# Patient Record
Sex: Male | Born: 1950
Health system: Southern US, Community
[De-identification: ages and names within clinical notes are randomized; demographics above are authoritative.]

## PROBLEM LIST (undated history)

## (undated) DIAGNOSIS — R269 Unspecified abnormalities of gait and mobility: Secondary | ICD-10-CM

## (undated) DIAGNOSIS — M25569 Pain in unspecified knee: Secondary | ICD-10-CM

## (undated) DIAGNOSIS — M25519 Pain in unspecified shoulder: Secondary | ICD-10-CM

## (undated) DIAGNOSIS — G5600 Carpal tunnel syndrome, unspecified upper limb: Secondary | ICD-10-CM

## (undated) DIAGNOSIS — G2581 Restless legs syndrome: Secondary | ICD-10-CM

## (undated) DIAGNOSIS — F419 Anxiety disorder, unspecified: Secondary | ICD-10-CM

## (undated) DIAGNOSIS — G950 Syringomyelia and syringobulbia: Secondary | ICD-10-CM

## (undated) DIAGNOSIS — S92909A Unspecified fracture of unspecified foot, initial encounter for closed fracture: Secondary | ICD-10-CM

## (undated) DIAGNOSIS — F4310 Post-traumatic stress disorder, unspecified: Secondary | ICD-10-CM

## (undated) DIAGNOSIS — I639 Cerebral infarction, unspecified: Secondary | ICD-10-CM

## (undated) DIAGNOSIS — I739 Peripheral vascular disease, unspecified: Secondary | ICD-10-CM

## (undated) DIAGNOSIS — I10 Essential (primary) hypertension: Secondary | ICD-10-CM

## (undated) DIAGNOSIS — M4802 Spinal stenosis, cervical region: Secondary | ICD-10-CM

## (undated) DIAGNOSIS — M479 Spondylosis, unspecified: Secondary | ICD-10-CM

## (undated) DIAGNOSIS — M47812 Spondylosis without myelopathy or radiculopathy, cervical region: Principal | ICD-10-CM

## (undated) HISTORY — DX: Unspecified abnormalities of gait and mobility: R26.9

## (undated) HISTORY — PX: NECK SURGERY: SHX720

## (undated) HISTORY — DX: Post-traumatic stress disorder, unspecified: F43.10

## (undated) HISTORY — DX: Unspecified fracture of unspecified foot, initial encounter for closed fracture: S92.909A

## (undated) HISTORY — DX: Syringomyelia and syringobulbia: G95.0

## (undated) HISTORY — DX: Spondylosis, unspecified: M47.9

## (undated) HISTORY — DX: Carpal tunnel syndrome, unspecified upper limb: G56.00

## (undated) HISTORY — DX: Restless legs syndrome: G25.81

## (undated) HISTORY — DX: Pain in unspecified shoulder: M25.519

## (undated) HISTORY — DX: Cerebral infarction, unspecified: I63.9

## (undated) HISTORY — PX: OTHER SURGICAL HISTORY: SHX169

## (undated) HISTORY — DX: Spinal stenosis, cervical region: M48.02

## (undated) HISTORY — PX: FEMORAL ARTERY STENT: SHX1583

## (undated) HISTORY — DX: Spondylosis without myelopathy or radiculopathy, cervical region: M47.812

## (undated) HISTORY — DX: Pain in unspecified knee: M25.569

## (undated) HISTORY — DX: Essential (primary) hypertension: I10

## (undated) HISTORY — DX: Anxiety disorder, unspecified: F41.9

---

## 1978-06-07 HISTORY — PX: KNEE SURGERY: SHX244

## 2006-08-26 ENCOUNTER — Ambulatory Visit: Payer: Self-pay | Admitting: Family Medicine

## 2006-08-29 ENCOUNTER — Ambulatory Visit (HOSPITAL_COMMUNITY): Admission: RE | Admit: 2006-08-29 | Discharge: 2006-08-29 | Payer: Self-pay | Admitting: Family Medicine

## 2006-08-29 ENCOUNTER — Ambulatory Visit: Payer: Self-pay | Admitting: *Deleted

## 2006-08-30 ENCOUNTER — Ambulatory Visit: Payer: Self-pay | Admitting: Family Medicine

## 2006-09-21 ENCOUNTER — Ambulatory Visit (HOSPITAL_COMMUNITY): Admission: RE | Admit: 2006-09-21 | Discharge: 2006-09-21 | Payer: Self-pay | Admitting: Family Medicine

## 2006-10-17 ENCOUNTER — Ambulatory Visit: Payer: Self-pay | Admitting: Family Medicine

## 2006-10-24 ENCOUNTER — Ambulatory Visit (HOSPITAL_COMMUNITY): Admission: RE | Admit: 2006-10-24 | Discharge: 2006-10-24 | Payer: Self-pay | Admitting: Family Medicine

## 2006-11-11 ENCOUNTER — Ambulatory Visit: Payer: Self-pay | Admitting: Family Medicine

## 2007-03-13 ENCOUNTER — Ambulatory Visit: Payer: Self-pay | Admitting: Family Medicine

## 2007-10-31 ENCOUNTER — Ambulatory Visit: Payer: Self-pay | Admitting: Family Medicine

## 2007-11-04 ENCOUNTER — Ambulatory Visit (HOSPITAL_COMMUNITY): Admission: RE | Admit: 2007-11-04 | Discharge: 2007-11-04 | Payer: Self-pay | Admitting: Family Medicine

## 2008-01-25 ENCOUNTER — Ambulatory Visit (HOSPITAL_COMMUNITY): Admission: RE | Admit: 2008-01-25 | Discharge: 2008-01-26 | Payer: Self-pay | Admitting: Neurosurgery

## 2008-09-06 ENCOUNTER — Encounter: Admission: RE | Admit: 2008-09-06 | Discharge: 2008-09-06 | Payer: Self-pay | Admitting: Neurosurgery

## 2009-01-01 ENCOUNTER — Other Ambulatory Visit: Admission: RE | Admit: 2009-01-01 | Discharge: 2009-01-01 | Payer: Self-pay | Admitting: Interventional Radiology

## 2009-01-01 ENCOUNTER — Encounter: Admission: RE | Admit: 2009-01-01 | Discharge: 2009-01-01 | Payer: Self-pay | Admitting: Neurosurgery

## 2009-01-01 ENCOUNTER — Encounter (INDEPENDENT_AMBULATORY_CARE_PROVIDER_SITE_OTHER): Payer: Self-pay | Admitting: Interventional Radiology

## 2009-04-18 ENCOUNTER — Ambulatory Visit (HOSPITAL_COMMUNITY): Admission: RE | Admit: 2009-04-18 | Discharge: 2009-04-18 | Payer: Self-pay | Admitting: Neurosurgery

## 2010-01-14 ENCOUNTER — Encounter: Admission: RE | Admit: 2010-01-14 | Discharge: 2010-01-14 | Payer: Self-pay | Admitting: Neurosurgery

## 2010-05-21 ENCOUNTER — Ambulatory Visit (HOSPITAL_COMMUNITY)
Admission: RE | Admit: 2010-05-21 | Discharge: 2010-05-22 | Payer: Self-pay | Source: Home / Self Care | Attending: Neurosurgery | Admitting: Neurosurgery

## 2010-06-28 ENCOUNTER — Encounter: Payer: Self-pay | Admitting: Family Medicine

## 2010-08-18 LAB — CBC
HCT: 46.6 % (ref 39.0–52.0)
Hemoglobin: 16 g/dL (ref 13.0–17.0)
MCH: 30.7 pg (ref 26.0–34.0)
MCHC: 34.3 g/dL (ref 30.0–36.0)
MCV: 89.3 fL (ref 78.0–100.0)
Platelets: 226 10*3/uL (ref 150–400)
RBC: 5.22 MIL/uL (ref 4.22–5.81)
RDW: 12.9 % (ref 11.5–15.5)
WBC: 7.6 10*3/uL (ref 4.0–10.5)

## 2010-08-18 LAB — SURGICAL PCR SCREEN
MRSA, PCR: NEGATIVE
Staphylococcus aureus: NEGATIVE

## 2010-09-09 LAB — CBC
HCT: 44 % (ref 39.0–52.0)
Hemoglobin: 15.2 g/dL (ref 13.0–17.0)
MCHC: 34.5 g/dL (ref 30.0–36.0)
MCV: 91.5 fL (ref 78.0–100.0)
Platelets: 231 10*3/uL (ref 150–400)
RBC: 4.81 MIL/uL (ref 4.22–5.81)
RDW: 14.1 % (ref 11.5–15.5)
WBC: 8.7 10*3/uL (ref 4.0–10.5)

## 2010-10-20 NOTE — Op Note (Signed)
NAME:  URBAN, NAVAL NO.:  [id number]   MEDICAL RECORD NO.:  [id number]          PATIENT TYPE:  OIB   LOCATION:  3533                         FACILITY:  [facility]   PHYSICIAN:  Danae Orleans. Venetia Maxon, M.D.  DATE OF BIRTH:  July 26, 1950   DATE OF PROCEDURE:  01/25/2008  DATE OF DISCHARGE:                               OPERATIVE REPORT   PREOPERATIVE DIAGNOSES:  Herniated cervical disk with cervical  myelopathy and spondylosis with myelopathy and cervical stenosis, and  cervical radiculopathy C3-4 and C4-5 levels.   POSTOPERATIVE DIAGNOSIS:  Herniated cervical disk with cervical  myelopathy and spondylosis with myelopathy and cervical stenosis, and  cervical radiculopathy C3-4 and C4-5 levels.   PROCEDURE:  Anterior cervical decompression and fusion C3-4 and C4-5  levels with allograft, bone graft, morselized bone autograft, FortrOss,  and anterior cervical plate.   SURGEON:  Danae Orleans. Venetia Maxon, MD   ASSISTANT:  Stefani Dama, MD   ANESTHESIA:  General endotracheal anesthesia.   ESTIMATED BLOOD LOSS:  Minimal.   COMPLICATIONS:  None.   DISPOSITION:  Recovery.   INDICATIONS:  Matthew [name] is a 60 year old man with significant  cervical myelopathy, cords compression, and canal stenosis with  increased signal in the spinal cord at C3-4 and C4-5 levels.  It was  elected to take him to surgery for anterior cervical decompression and  fusion at these affected levels.   PROCEDURE IN DETAIL:  Matthew [name] was brought to the operating room.  Following satisfactory and uncomplicated induction of general  endotracheal anesthesia and placement of intravenous lines, the patient  was placed in a supine position on the operating table.  His neck was  maintained in neutral alignment.  He was placed in 5 pounds of Halter  traction.  His anterior neck was then prepped and draped in usual  sterile fashion.  An incision was infiltrated with lidocaine.  Incision  was made in the  anterior border of sternocleidomastoid muscle to shy of  the midline and left side of midline, and then over the upper neck  creases, carried sharply through the platysmal layer.  Subplatysmal  dissection was performed exposing the anterior border of  sternocleidomastoid muscle and external jugular keeping the carotid  sheath lateral, and trachea and esophagus kept medial as the anterior  cervical spine was exposed.  There was very large osteophyte and it was  felt to be at C4-5 level in intraoperative x-ray with initial marker  probes demonstrated needle at the C5-6 levels.  Subsequent x-ray  demonstrated marker needle at the C3-4 level.  The longus colli muscles  were taken down from the anterior cervical spine with electrocautery and  Key elevator and self-retaining shadow line retractors were placed along  with up and down retractor.  The interspace at C3-4 was incised.  Disk  material was removed in a piecemeal fashion.  Endplates were stripped of  residual disk material.  Subsequently, using osteophyte tool and Baer  rongeurs, the overlying osteophyte was removed exposing the C4-5  interspace.  Disk material was removed at this level as well.  Distraction pins were placed initially  at C3 and C4 and using gentle  distraction the endplates where the interspace was further opened.  Microscope was brought into field using high-power microscopic  visualization.  The endplates of the C3 and C4 decorticated and large  uncinate spurs were drilled down along the central osteophytes.  Using a  variety of 1 and 2-mm gold-tipped Kerrison rongeurs, the interspace was  further evacuated posterior longitudinal ligament, herniated disk  material, and large bone spurs, and spinal cord dura was decompressed.  Hemostasis was assured.  After trial sizing, a 7-mm allograft bone wedge  was selected, fashioned with high-speed drill, packed with morselized  bone autograft, and FortrOss reconstituted with  the patient's blood.  This was then inserted in the interspace and countersunk appropriately.  Attention was then turned to C4-5 level and a similar decompression was  performed.  The spinal cord dura was decompressed as were both neural  foramen.  Hemostasis was again assured and subsequently a similarly-  sized bone allograft was fashioned with high-speed drill, packed,  morselized bone autograft inserted, and FortrOss inserted in the  interspace and countersunk appropriately.  A 34-mm anterior cervical  plate was then affixed to the anterior cervical spine after removing  some ventral osteophytes and flattening the C4 vertebra.  Plate appeared  to fit well.  The traction weight was removed.  The 14-mm variable angle  screws were placed 2 at C3, 2 at C4, 2 at C5 and all screws had  excellent purchase and locking mechanisms were engaged.  Final x-ray  demonstrated well-positioned interbody grafts and anterior cervical  plate.  Wound was irrigated.  Soft tissues were inspected and found to  be in good repair.  The platysma layer was closed with 3-0 Vicryl  sutures.  Skin edges were reapproximated with 3-0 Vicryl subcuticular  stitch.  Wound was dressed with Dermabond.  The patient was extubated in  the operating room and taken to the recovery room in stable and  satisfactory condition having tolerated the operation well.  Counts were  correct at the end of the case.      Danae Orleans. Venetia Maxon, M.D.  Electronically Signed     JDS/MEDQ  D:  01/25/2008  T:  01/26/2008  Job:  [id number]

## 2012-02-01 ENCOUNTER — Other Ambulatory Visit: Payer: Self-pay | Admitting: Neurosurgery

## 2012-02-01 DIAGNOSIS — I6350 Cerebral infarction due to unspecified occlusion or stenosis of unspecified cerebral artery: Secondary | ICD-10-CM

## 2012-02-10 ENCOUNTER — Ambulatory Visit
Admission: RE | Admit: 2012-02-10 | Discharge: 2012-02-10 | Disposition: A | Discharge status: Home / Self Care | Payer: Medicare Other | Source: Ambulatory Visit | Attending: Neurosurgery | Admitting: Neurosurgery

## 2012-02-10 DIAGNOSIS — I6350 Cerebral infarction due to unspecified occlusion or stenosis of unspecified cerebral artery: Secondary | ICD-10-CM

## 2012-02-10 MED ORDER — GADOBENATE DIMEGLUMINE 529 MG/ML IV SOLN
16.0000 mL | Freq: Once | INTRAVENOUS | Status: AC | PRN
  Administered 2012-02-10: 16 mL via INTRAVENOUS

## 2013-03-08 ENCOUNTER — Other Ambulatory Visit: Payer: Self-pay | Admitting: Orthopedic Surgery

## 2013-03-08 DIAGNOSIS — M25512 Pain in left shoulder: Secondary | ICD-10-CM

## 2013-03-09 ENCOUNTER — Ambulatory Visit
Admission: RE | Admit: 2013-03-09 | Discharge: 2013-03-09 | Disposition: A | Discharge status: Home / Self Care | Payer: Medicare Other | Source: Ambulatory Visit | Attending: Orthopedic Surgery | Admitting: Orthopedic Surgery

## 2013-03-09 DIAGNOSIS — M25512 Pain in left shoulder: Secondary | ICD-10-CM

## 2013-05-07 ENCOUNTER — Encounter (INDEPENDENT_AMBULATORY_CARE_PROVIDER_SITE_OTHER): Payer: Self-pay

## 2013-05-07 ENCOUNTER — Encounter: Payer: Self-pay | Admitting: Neurology

## 2013-05-07 ENCOUNTER — Ambulatory Visit (INDEPENDENT_AMBULATORY_CARE_PROVIDER_SITE_OTHER): Payer: Medicare Other | Admitting: Neurology

## 2013-05-07 VITALS — BP 139/81 | HR 57 | Ht 72.0 in | Wt 156.5 lb

## 2013-05-07 DIAGNOSIS — R269 Unspecified abnormalities of gait and mobility: Secondary | ICD-10-CM

## 2013-05-07 DIAGNOSIS — G992 Myelopathy in diseases classified elsewhere: Secondary | ICD-10-CM

## 2013-05-07 DIAGNOSIS — M47812 Spondylosis without myelopathy or radiculopathy, cervical region: Secondary | ICD-10-CM

## 2013-05-07 HISTORY — DX: Spondylosis without myelopathy or radiculopathy, cervical region: M47.812

## 2013-05-07 HISTORY — DX: Myelopathy in diseases classified elsewhere: G99.2

## 2013-05-07 HISTORY — DX: Unspecified abnormalities of gait and mobility: R26.9

## 2013-05-07 NOTE — Patient Instructions (Signed)

## 2013-05-07 NOTE — Progress Notes (Signed)
Reason for visit: Cervical spondylosis  Matthew [name] is a 62 y.o. male  History of present illness:  Matthew [name] is a 62 year old right-handed white male with a history of cervical spondylosis and spinal stenosis requiring surgery in 2009 and 2010. The patient had difficulty with walking prior to the surgery, but he regained his ability to ambulate following surgery. The right-sided neck and arm discomfort improved, but the left-sided discomfort did not. The patient continues to have left neck, shoulder, and upper arm pain. The patient feels as if the left arm is weaker than the right. The patient has some discomfort in the knee joints as well, and occasionally he will feel as if the left knee will give away, and he will fall on occasion. The patient denies problems controlling the bowels or the bladder. The patient has been using low-dose tizanidine, 2 mg at night with some benefit. The patient in the past has not tolerated gabapentin. Cymbalta does not help the pain much, but it helps his posttraumatic stress disorder. The patient comes to this office for further evaluation. The patient has tried [facility] in the past without benefit. The patient does have some crepitus of the neck. The patient has significant restriction of range of movement of the neck.  Past Medical History  Diagnosis Date  . Anxiety disorder   . Hypertension   . Joint pain, knee   . Pain in joint, shoulder region   . Post traumatic stress disorder   . Spinal stenosis in cervical region   . Spondylosis   . Stroke   . Cervical spondylosis without myelopathy 05/07/2013  . Gait disorder 05/07/2013  . Foot fracture     right foot    Past Surgical History  Procedure Laterality Date  . Knee surgery  1980  . Neck surgery      2009 or 2010    Family History  Problem Relation Age of Onset  . Hypertension Other   . Stroke Other   . Diabetes Other   . Cancer Brother 60    brain tumor  . Cancer - Lung Mother   .  Diabetes Father     Social history:  reports that he has quit smoking. He has never used smokeless tobacco. He reports that he does not drink alcohol or use illicit drugs.  Medications:  No current outpatient prescriptions on file prior to visit.   No current facility-administered medications on file prior to visit.      Allergies  Allergen Reactions  . Codeine     ROS:  Out of a complete 14 system review of symptoms, the patient complains only of the following symptoms, and all other reviewed systems are negative.  Chills, weight loss Ringing in the ears Loss of vision Easy bruising, easy bleeding Feeling cold Joint pain, muscle cramps, achy muscles Runny nose Weakness Anxiety, not enough sleep, decreased energy Restless legs  Blood pressure 139/81, pulse 57, height 6' (1.829 m), weight 156 lb 8 oz (70.988 kg).  Physical Exam  General: The patient is alert and cooperative at the time of the examination.  Head: Pupils are equal, round, and reactive to light. Discs are flat bilaterally.  Neck: The neck is supple, no carotid bruits are noted.  Respiratory: The respiratory examination is clear.  Cardiovascular: The cardiovascular examination reveals a regular rate and rhythm, no obvious murmurs or rubs are noted.  Neuromuscular: The patient is able to generate only about 15-20 of lateral rotation of the  cervical spine bilaterally.  Skin: Extremities are without significant edema.  Neurologic Exam  Mental status: The patient is alert and oriented x 3 at the time of the examination.  Cranial nerves: Facial symmetry is present. There is good sensation of the face to pinprick and soft touch bilaterally. The strength of the facial muscles and the muscles to head turning and shoulder shrug are normal bilaterally. Speech is well enunciated, no aphasia or dysarthria is noted. Extraocular movements are full. Visual fields are full.  Motor: The motor testing reveals 5 over  5 strength of all 4 extremities, with the exception that there is some weakness proximally with the left leg, 4+/5. Good symmetric motor tone is noted throughout.  Sensory: Sensory testing is intact to pinprick, soft touch, vibration sensation, and position sense on all 4 extremities. No evidence of extinction is noted.  Coordination: Cerebellar testing reveals good finger-nose-finger and heel-to-shin bilaterally.  Gait and station: Gait is normal. Tandem gait is normal. Romberg is negative. No drift is seen.  Reflexes: Deep tendon reflexes are symmetric and normal bilaterally, with the exception that the left biceps reflex is elevated compared to the right. Toes are downgoing bilaterally.   Assessment/Plan:  1. Cervical spondylosis  2. Degenerative arthritis  3. Gait disorder  The patient has significant range of movement restrictions of the cervical spine. The patient is having ongoing discomfort in the left neck, shoulder, and arm. Nerve conduction studies will be done on both arms, EMG evaluation will be done on the left arm. The patient will have a followup MRI of the cervical spine, and the last study was done in 2011. The patient may be given a trial on medications such as Lyrica in the future. A TENS unit trial may be considered. The patient will get a knee brace for the right knee.  Matthew Palau MD 05/07/2013 6:12 PM  [facility] [address] [place], [place] [address]  Phone [phone] Fax [phone]

## 2013-05-14 ENCOUNTER — Ambulatory Visit (INDEPENDENT_AMBULATORY_CARE_PROVIDER_SITE_OTHER): Payer: Medicare Other | Admitting: Neurology

## 2013-05-14 ENCOUNTER — Encounter (INDEPENDENT_AMBULATORY_CARE_PROVIDER_SITE_OTHER): Payer: Self-pay

## 2013-05-14 DIAGNOSIS — M47812 Spondylosis without myelopathy or radiculopathy, cervical region: Secondary | ICD-10-CM

## 2013-05-14 DIAGNOSIS — R269 Unspecified abnormalities of gait and mobility: Secondary | ICD-10-CM

## 2013-05-14 DIAGNOSIS — G5602 Carpal tunnel syndrome, left upper limb: Secondary | ICD-10-CM

## 2013-05-14 DIAGNOSIS — Z0289 Encounter for other administrative examinations: Secondary | ICD-10-CM

## 2013-05-14 DIAGNOSIS — G5601 Carpal tunnel syndrome, right upper limb: Secondary | ICD-10-CM

## 2013-05-14 DIAGNOSIS — G5600 Carpal tunnel syndrome, unspecified upper limb: Secondary | ICD-10-CM

## 2013-05-14 DIAGNOSIS — G542 Cervical root disorders, not elsewhere classified: Secondary | ICD-10-CM

## 2013-05-14 NOTE — Procedures (Signed)
     HISTORY:  Matthew [name] is a 62 year old gentleman with a history of cervical spine surgery in the past, the last surgery was in 2010. The patient has had persistent pain in the left neck, down the left arm. The patient reports numbness in the hands. The patient is being evaluated for a possible neuropathy or a cervical radiculopathy.  NERVE CONDUCTION STUDIES:  Nerve conduction studies were performed on both upper extremities. The distal motor latencies for the median nerves were prolonged bilaterally, with normal motor amplitudes for these nerves. The distal motor latencies and motor amplitudes for the ulnar nerves were normal bilaterally. The F wave latencies for the median nerves were prolonged on the right, borderline normal on the left, and normal for the ulnar nerves bilaterally. The sensory latencies for the median nerves were prolonged bilaterally, normal for the ulnar nerves bilaterally.  EMG STUDIES:  EMG study was performed on the left upper extremity:  The first dorsal interosseous muscle reveals 2 to 4 K units with full recruitment. No fibrillations or positive waves were noted. The abductor pollicis brevis muscle reveals 2 to 4 K units with full recruitment. No fibrillations or positive waves were noted. The extensor indicis proprius muscle reveals 1 to 3 K units with full recruitment. No fibrillations or positive waves were noted. The pronator teres muscle reveals 2 to 3 K units with full recruitment. No fibrillations or positive waves were noted. The biceps muscle reveals 1 to 2 K units with full recruitment. No fibrillations or positive waves were noted. The triceps muscle reveals 2 to 4 K units with full recruitment. No fibrillations or positive waves were noted. The anterior deltoid muscle reveals 2 to 3 K units with full recruitment. No fibrillations or positive waves were noted. The cervical paraspinal muscles were tested at 2 levels. One plus positive waves were  seen at both levels tested. There was good relaxation.   IMPRESSION:  Nerve conduction studies done on both upper extremities shows evidence of bilateral mild carpal tunnel syndrome. EMG evaluation of the left upper extremity was relatively unremarkable, but mild denervation was seen at both levels of the cervical paraspinal muscles, suggestive of a low-grade cervical radiculopathy indeterminate level.  [name] MD 05/14/2013 1:36 PM  [facility] [address] [place], [place] [address]  Phone [phone] Fax [phone]

## 2013-05-14 NOTE — Progress Notes (Signed)
Matthew [name] is a 62 year old gentleman with a history of cervical spine surgery on 2 occasions, the last surgery was done in 2010. The patient has had persistent pain in the left neck and shoulder, down the left arm to the elbow. The patient returns to the office today for EMG and nerve conduction study.  Nerve conduction studies of both arms shows evidence of bilateral mild carpal tunnel syndrome. EMG of the left arm was unremarkable, but the cervical paraspinal muscles on the left showed mild denervation. The possibility of a low-grade cervical radiculopathy of indeterminate level is noted.  The patient will be sent for MRI of the cervical spine, this study is pending. The patient will be set up for physical therapy for neuromuscular therapy, and a TENS unit trial.  The patient will followup in about 3 or 4 months.

## 2013-05-21 ENCOUNTER — Other Ambulatory Visit: Payer: Medicare Other

## 2013-05-22 ENCOUNTER — Ambulatory Visit
Admission: RE | Admit: 2013-05-22 | Discharge: 2013-05-22 | Disposition: A | Discharge status: Home / Self Care | Payer: Medicare Other | Source: Ambulatory Visit | Attending: Neurology | Admitting: Neurology

## 2013-05-22 DIAGNOSIS — R269 Unspecified abnormalities of gait and mobility: Secondary | ICD-10-CM

## 2013-05-22 DIAGNOSIS — M47812 Spondylosis without myelopathy or radiculopathy, cervical region: Secondary | ICD-10-CM

## 2013-05-23 ENCOUNTER — Telehealth: Payer: Self-pay | Admitting: Neurology

## 2013-05-23 DIAGNOSIS — M47812 Spondylosis without myelopathy or radiculopathy, cervical region: Secondary | ICD-10-CM

## 2013-05-23 MED ORDER — PREGABALIN 50 MG PO CAPS: ORAL_CAPSULE | ORAL | Status: DC

## 2013-05-23 NOTE — Telephone Encounter (Signed)
I called patient. The MRI of the cervical spine shows worsening facet joint arthritis at multiple levels from the C2 level through the C7 level. There is evidence of prior spinal cord injury at the C4 level, unchanged from 2011. Not clear that surgery is likely to help this gentleman. The patient has tried epidural steroid injections without significant benefit. I will start the patient on Lyrica, and sent to physical therapy for a TENS unit trial.   Abnormal MRI cervical spine (without) demonstrating: 1. Anterior cervical interbody fusion from C3 to C7, with anterior  vertebral body screws and metal hardware. The spinal cord is notable for  punctate foci of gliosis at C4 and C4-5 levels. These findings are stable  since 01/14/10. 2. Multi-level foraminal stenosis from C2-3 to C7-T1 as detailed above.  Findings have worsened since last scan on 01/14/10.

## 2013-05-24 ENCOUNTER — Other Ambulatory Visit: Payer: Self-pay | Admitting: Neurology

## 2013-05-24 NOTE — Telephone Encounter (Signed)
Rx faxed to [facility] at [phone].

## 2013-10-30 ENCOUNTER — Telehealth: Payer: Self-pay | Admitting: Neurology

## 2013-10-30 ENCOUNTER — Other Ambulatory Visit: Payer: Self-pay | Admitting: Neurology

## 2013-10-30 DIAGNOSIS — G5600 Carpal tunnel syndrome, unspecified upper limb: Secondary | ICD-10-CM

## 2013-10-30 MED ORDER — IBUPROFEN 600 MG PO TABS: 600.0000 mg | ORAL_TABLET | Freq: Three times a day (TID) | ORAL | Status: DC | PRN

## 2013-10-30 NOTE — Telephone Encounter (Signed)
Patient calling to state that he is having a lot of pain and is not able to wait for his September appointment. Patient states he cannot shave or comb or even sleep due to his carpal tunnel. Patient is also requesting a script for wrist splints. Please call and advise patient.

## 2013-10-30 NOTE — Telephone Encounter (Signed)
I called patient. The patient is having ongoing discomfort in the hands, particularly at night associated with carpal tunnel syndrome bilaterally. I will write a prescription for wrist months, and for ibuprofen.

## 2013-10-30 NOTE — Telephone Encounter (Signed)
Pt is calling stating that he is in a lot of pain and would like for Dr. Anne Hahn to order pt wrist splints because pt states that he is unable to shave, comb hair or even sleep because of the pain and cannot wait until September to be seen. Please advise

## 2013-11-05 ENCOUNTER — Ambulatory Visit: Payer: Medicare Other | Admitting: Neurology

## 2013-11-07 NOTE — Telephone Encounter (Signed)
This encounter was created in error - please disregard.

## 2013-12-03 ENCOUNTER — Other Ambulatory Visit: Payer: Self-pay | Admitting: Neurology

## 2013-12-04 NOTE — Telephone Encounter (Signed)
Rx has been faxed.

## 2013-12-10 ENCOUNTER — Telehealth: Payer: Self-pay

## 2013-12-10 NOTE — Telephone Encounter (Signed)
The pharmacy sent us a faxing saying a prior auth was required for Lyrica.  I contacted ins, [employer], and they replied stating no prior Berkley Harveyauth is required Ref # [id number] Patient ID # [id number]

## 2014-01-10 ENCOUNTER — Telehealth: Payer: Self-pay | Admitting: *Deleted

## 2014-01-10 NOTE — Telephone Encounter (Signed)
Calling patient because his name is on the wait list for an appointment, no answer left voice message for patient to return the call.

## 2014-02-06 ENCOUNTER — Ambulatory Visit (INDEPENDENT_AMBULATORY_CARE_PROVIDER_SITE_OTHER): Payer: Medicare Other | Admitting: Neurology

## 2014-02-06 ENCOUNTER — Encounter: Payer: Self-pay | Admitting: Neurology

## 2014-02-06 VITALS — BP 130/70 | HR 64 | Wt 168.0 lb

## 2014-02-06 DIAGNOSIS — R269 Unspecified abnormalities of gait and mobility: Secondary | ICD-10-CM

## 2014-02-06 DIAGNOSIS — G5603 Carpal tunnel syndrome, bilateral upper limbs: Secondary | ICD-10-CM

## 2014-02-06 DIAGNOSIS — M47812 Spondylosis without myelopathy or radiculopathy, cervical region: Secondary | ICD-10-CM

## 2014-02-06 DIAGNOSIS — G5600 Carpal tunnel syndrome, unspecified upper limb: Secondary | ICD-10-CM

## 2014-02-06 HISTORY — DX: Carpal tunnel syndrome, unspecified upper limb: G56.00

## 2014-02-06 MED ORDER — PREGABALIN 100 MG PO CAPS: 100.0000 mg | ORAL_CAPSULE | Freq: Two times a day (BID) | ORAL | Status: DC

## 2014-02-06 MED ORDER — TIZANIDINE HCL 4 MG PO TABS: 4.0000 mg | ORAL_TABLET | Freq: Three times a day (TID) | ORAL | Status: DC

## 2014-02-06 NOTE — Patient Instructions (Signed)

## 2014-02-06 NOTE — Progress Notes (Signed)
Reason for visit: Cervical spondylosis  Matthew [name] is an 63 y.o. male  History of present illness:  Matthew [name] is a 63 year old right-handed white male with a history of significant cervical spondylosis and chronic neck discomfort. The patient also reports some problems with low back pain, and some discomfort affecting the right leg primarily. The patient may have cramps in the calf muscles. He also has some burning sensations in the thighs, right greater than left. The has ongoing crepitus in the neck with increased pain. He reports symptoms of numbness in the hands associated with carpal tunnel syndrome, but the wrist splints have helped significantly. The patient denies any issues controlling the bowels or the bladder. He has been on tizanidine without much benefit by itself, but the addition of Robaxin to the Zanaflex does help. The patient is on Lyrica taking 50 mg in the morning and 100 mg in the evening. He also takes Cymbalta 30 mg twice daily. He returns to this office for an evaluation.  Past Medical History  Diagnosis Date  . Anxiety disorder   . Hypertension   . Joint pain, knee   . Pain in joint, shoulder region   . Post traumatic stress disorder   . Spinal stenosis in cervical region   . Spondylosis   . Stroke   . Cervical spondylosis without myelopathy 05/07/2013  . Gait disorder 05/07/2013  . Foot fracture     right foot  . Carpal tunnel syndrome     Bilateral    Past Surgical History  Procedure Laterality Date  . Knee surgery  1980  . Neck surgery      2009 or 2010    Family History  Problem Relation Age of Onset  . Hypertension Other   . Stroke Other   . Diabetes Other   . Cancer Brother 60    brain tumor  . Cancer - Lung Mother   . Diabetes Father     Social history:  reports that he has quit smoking. He has never used smokeless tobacco. He reports that he does not drink alcohol or use illicit drugs.    Allergies  Allergen Reactions  .  Codeine     Medications:  Current Outpatient Prescriptions on File Prior to Visit  Medication Sig Dispense Refill  . DULoxetine (CYMBALTA) 30 MG capsule Take 30 mg by mouth 2 (two) times daily.      Marland Kitchen ibuprofen (ADVIL,MOTRIN) 600 MG tablet TAKE 1 TABLET BY MOUTH EVERY 8 HOURS AS NEEDED  270 tablet  0  . lisinopril (PRINIVIL,ZESTRIL) 10 MG tablet Take 10 mg by mouth daily.       No current facility-administered medications on file prior to visit.    ROS:  Out of a complete 14 system review of symptoms, the patient complains only of the following symptoms, and all other reviewed systems are negative.  Restless leg syndrome Joint pain, joint swelling, achy muscles, muscle cramps, walking difficulties, neck pain, neck stiffness Weakness Agitation, anxiety  Blood pressure 130/70, pulse 64, weight 168 lb (76.204 kg).  Physical Exam  General: The patient is alert and cooperative at the time of the examination.  Neuromuscular: The patient lacks about 30 of lateral rotation of the cervical spine bilaterally, with limitation of neck flexion and extension as well.  Skin: No significant peripheral edema is noted.   Neurologic Exam  Mental status: The patient is oriented x 3.  Cranial nerves: Facial symmetry is present. Speech is normal, no  aphasia or dysarthria is noted. Extraocular movements are full. Visual fields are full.  Motor: The patient has good strength in all 4 extremities, with exception of some proximal muscle weakness of the left leg.  Sensory examination: Soft touch sensation is symmetric on the face, arms, and legs.  Coordination: The patient has good finger-nose-finger and heel-to-shin bilaterally.  Gait and station: The patient has a normal gait. Tandem gait is normal. Romberg is negative. No drift is seen.  Reflexes: Deep tendon reflexes are symmetric in the legs, but the right biceps reflex is depressed relative to the left in arms, symmetric triceps reflexes  are noted.   MRI cervical 05/23/13:  IMPRESSION:  Abnormal MRI cervical spine (without) demonstrating:  1. Anterior cervical interbody fusion from C3 to C7, with anterior vertebral body screws and metal hardware. The spinal cord is notable for punctate foci of gliosis at C4 and C4-5 levels. These findings are stable since 01/14/10.  2. Multi-level foraminal stenosis from C2-3 to C7-T1 as detailed above. Findings have worsened since last scan on 01/14/10.    Assessment/Plan:  One. Cervical spondylosis  2. Chronic pain syndrome  3. Bilateral carpal tunnel syndrome  The patient will be set up for a TENS unit trial in the neck and shoulders. The Lyrica will be increased taking 100 mg twice daily. The tizanidine will also be increased taking 4 mg 3 times daily, and the patient apparently gets benefit from concurrent use of the Robaxin. The patient will continue the use of the wrist splint, but if the carpal tunnel syndrome symptoms worsen, a referral to a hand surgeon may be done. The patient will followup in about 3 months.  Matthew Palau MD 02/06/2014 8:40 PM  [facility] [address] [place], [place] [address]  Phone [phone] Fax [phone]

## 2014-02-08 ENCOUNTER — Telehealth: Payer: Self-pay | Admitting: Neurology

## 2014-02-08 NOTE — Telephone Encounter (Signed)
Faxed patient request for electrotherapy form to [employer] on 02/08/14.

## 2014-02-21 ENCOUNTER — Ambulatory Visit: Payer: Medicare Other | Admitting: Neurology

## 2014-02-25 ENCOUNTER — Ambulatory Visit: Payer: Medicare Other | Attending: Neurology | Admitting: Physical Therapy

## 2014-02-25 DIAGNOSIS — M542 Cervicalgia: Secondary | ICD-10-CM | POA: Diagnosis not present

## 2014-02-25 DIAGNOSIS — IMO0001 Reserved for inherently not codable concepts without codable children: Secondary | ICD-10-CM | POA: Insufficient documentation

## 2014-02-25 DIAGNOSIS — M2550 Pain in unspecified joint: Secondary | ICD-10-CM | POA: Insufficient documentation

## 2014-02-25 DIAGNOSIS — R293 Abnormal posture: Secondary | ICD-10-CM | POA: Diagnosis not present

## 2014-02-25 DIAGNOSIS — R5381 Other malaise: Secondary | ICD-10-CM | POA: Diagnosis not present

## 2014-02-25 DIAGNOSIS — R269 Unspecified abnormalities of gait and mobility: Secondary | ICD-10-CM | POA: Insufficient documentation

## 2014-02-26 ENCOUNTER — Telehealth: Payer: Self-pay | Admitting: Neurology

## 2014-02-26 DIAGNOSIS — M1712 Unilateral primary osteoarthritis, left knee: Secondary | ICD-10-CM

## 2014-02-26 NOTE — Telephone Encounter (Signed)
See phone note 9/22

## 2014-02-26 NOTE — Telephone Encounter (Signed)
I called patient. The patient indicates some issues with his left knee, he has a brace on the right side which is helpful, wishes to have one for the left side as well. I will write a prescription.

## 2014-02-26 NOTE — Telephone Encounter (Signed)
Patient requesting a script for knee brace for left side, please return call and advise.

## 2014-03-07 ENCOUNTER — Ambulatory Visit: Payer: Medicare Other | Admitting: Physical Therapy

## 2014-03-08 ENCOUNTER — Encounter: Payer: Medicare Other | Admitting: Physical Therapy

## 2014-03-27 ENCOUNTER — Telehealth: Payer: Self-pay | Admitting: Neurology

## 2014-03-27 NOTE — Telephone Encounter (Signed)
Patient stated [employer] at [phone], need documentation stating patient will keep TENS Unit [facility]  [facility]  Invoice # [id number].  Please call and advise.

## 2014-03-29 NOTE — Telephone Encounter (Signed)
Spoke to [employer] and they need a certificate of medical necessity signed for continuation of TENS unit.  I don't have paperwork, but they will refax it today.  Also the patient will need a sooner follow up in order to keep the unit.    Spoke to patient and he was told that his follow up appointment in January was ok for him to get the TENS unit.  Also patient is complaining of numbness in his right hand small finger when he takes his wrist brace off in morning.  He is requesting a sooner appointment to see the doctor about that.  We will work him in next week when possible.

## 2014-03-30 NOTE — Telephone Encounter (Signed)
I will sign the form when I get it.

## 2014-04-01 ENCOUNTER — Ambulatory Visit: Payer: Medicare Other | Attending: Neurology | Admitting: Physical Therapy

## 2014-04-01 DIAGNOSIS — M2550 Pain in unspecified joint: Secondary | ICD-10-CM | POA: Diagnosis not present

## 2014-04-01 DIAGNOSIS — R293 Abnormal posture: Secondary | ICD-10-CM | POA: Insufficient documentation

## 2014-04-01 DIAGNOSIS — Z5189 Encounter for other specified aftercare: Secondary | ICD-10-CM | POA: Insufficient documentation

## 2014-04-01 DIAGNOSIS — R5381 Other malaise: Secondary | ICD-10-CM | POA: Insufficient documentation

## 2014-04-01 DIAGNOSIS — R269 Unspecified abnormalities of gait and mobility: Secondary | ICD-10-CM | POA: Insufficient documentation

## 2014-04-01 DIAGNOSIS — M542 Cervicalgia: Secondary | ICD-10-CM | POA: Insufficient documentation

## 2014-04-01 NOTE — Telephone Encounter (Signed)
Spoke to patient and scheduled appointment for tomorrow 04-02-14 at 10am.

## 2014-04-02 ENCOUNTER — Ambulatory Visit (INDEPENDENT_AMBULATORY_CARE_PROVIDER_SITE_OTHER): Payer: Medicare Other | Admitting: Neurology

## 2014-04-02 ENCOUNTER — Encounter: Payer: Self-pay | Admitting: Neurology

## 2014-04-02 VITALS — BP 130/70 | HR 56 | Ht 72.0 in | Wt 169.2 lb

## 2014-04-02 DIAGNOSIS — M47812 Spondylosis without myelopathy or radiculopathy, cervical region: Secondary | ICD-10-CM

## 2014-04-02 DIAGNOSIS — G5601 Carpal tunnel syndrome, right upper limb: Secondary | ICD-10-CM

## 2014-04-02 DIAGNOSIS — G5602 Carpal tunnel syndrome, left upper limb: Secondary | ICD-10-CM

## 2014-04-02 DIAGNOSIS — G5603 Carpal tunnel syndrome, bilateral upper limbs: Secondary | ICD-10-CM

## 2014-04-02 DIAGNOSIS — R269 Unspecified abnormalities of gait and mobility: Secondary | ICD-10-CM

## 2014-04-02 NOTE — Progress Notes (Signed)
Reason for visit: Cervical spondylosis  Marin ShutterGeorge T Ashford is an 63 y.o. male  History of present illness:  Mr. Chancy Milroyhaxton is a 63 year old right-handed white male with a history of cervical spondylosis, and chronic cervical spine and shoulder discomfort. The patient has degenerative arthritis that is causing increasing discomfort for him involving the knees and hands. The patient indicates that weather changes will result in increased discomfort with the arthritis. The patient has bilateral carpal tunnel syndrome that was mild on the study done in December 2014. The patient currently reports increasing discomfort in the right fifth finger with severe numbness that is not responding to the use of wrist splints. The patient has severe limitation of range movement of the cervical spine. He is followed by a physical therapist, working on balance issues. The patient has paresthesias in legs, and he has evidence of myelomalacia in the cervical spine. He returns this office for evaluation. He is on tizanidine, Robaxin, and Lyrica with some benefit. A TENS unit trial did result in some benefit, and the patient will be getting a TENS unit on a permanent basis. The patient will use a cane for ambulation.  Past Medical History  Diagnosis Date  . Anxiety disorder   . Hypertension   . Joint pain, knee   . Pain in joint, shoulder region   . Post traumatic stress disorder   . Spinal stenosis in cervical region   . Spondylosis   . Stroke   . Cervical spondylosis without myelopathy 05/07/2013  . Gait disorder 05/07/2013  . Foot fracture     right foot  . Carpal tunnel syndrome     Bilateral    Past Surgical History  Procedure Laterality Date  . Knee surgery  1980  . Neck surgery      2009 or 2010    Family History  Problem Relation Age of Onset  . Hypertension Other   . Stroke Other   . Diabetes Other   . Cancer Brother 60    brain tumor  . Cancer - Lung Mother   . Diabetes Father      Social history:  reports that he has quit smoking. He has never used smokeless tobacco. He reports that he does not drink alcohol or use illicit drugs.    Allergies  Allergen Reactions  . Penicillins   . Codeine     Medications:  Current Outpatient Prescriptions on File Prior to Visit  Medication Sig Dispense Refill  . DULoxetine (CYMBALTA) 30 MG capsule Take 30 mg by mouth 2 (two) times daily.      Marland Kitchen. lisinopril (PRINIVIL,ZESTRIL) 10 MG tablet Take 10 mg by mouth daily.      . methocarbamol (ROBAXIN) 500 MG tablet Take 500 mg by mouth 3 (three) times daily.      . pregabalin (LYRICA) 100 MG capsule Take 1 capsule (100 mg total) by mouth 2 (two) times daily.  60 capsule  5  . tiZANidine (ZANAFLEX) 4 MG tablet Take 1 tablet (4 mg total) by mouth 3 (three) times daily.  90 tablet  5  . ibuprofen (ADVIL,MOTRIN) 600 MG tablet TAKE 1 TABLET BY MOUTH EVERY 8 HOURS AS NEEDED  270 tablet  0   No current facility-administered medications on file prior to visit.    ROS:  Out of a complete 14 system review of symptoms, the patient complains only of the following symptoms, and all other reviewed systems are negative.  Chills, fatigue Light sensitivity Cold intolerance Restless  legs, insomnia Joint pain, achy muscles, muscle cramps, walking difficulties, neck pain, neck stiffness Memory loss, weakness Agitation, anxiety  Blood pressure 130/70, pulse 56, height 6' (1.829 m), weight 169 lb 3.2 oz (76.749 kg).  Physical Exam  General: The patient is alert and cooperative at the time of the examination.  Neuromuscular: The patient has severe limitation of range of motion movement of the cervical spine.  Skin: No significant peripheral edema is noted.   Neurologic Exam  Mental status: The patient is oriented x 3.  Cranial nerves: Facial symmetry is present. Speech is normal, no aphasia or dysarthria is noted. Extraocular movements are full. Visual fields are full.  Motor: The  patient has good strength in all 4 extremities, with exception that there is some mild weakness with hip flexion on the left.  Sensory examination: Soft touch sensation is symmetric on the face, arms, and legs, but the patient reports paresthesias with soft touch on the legs.  Coordination: The patient has good finger-nose-finger and heel-to-shin bilaterally.  Gait and station: The patient has a normal gait. Tandem gait is normal. Romberg is negative. No drift is seen.  Reflexes: Deep tendon reflexes are symmetric, with exception that the left biceps reflex is elevated relative to the right.    MRI cervical 05/23/13:  IMPRESSION:  Abnormal MRI cervical spine (without) demonstrating:  1. Anterior cervical interbody fusion from C3 to C7, with anterior vertebral body screws and metal hardware. The spinal cord is notable for punctate foci of gliosis at C4 and C4-5 levels. These findings are stable since 01/14/10.  2. Multi-level foraminal stenosis from C2-3 to C7-T1 as detailed above. Findings have worsened since last scan on 01/14/10.    Assessment/Plan:  1. Cervical spondylosis, cervical myelopathy  2. Bilateral carpal tunnel syndrome  3. Gait disorder  The patient is having increasing symptoms involving the right hand in the fifth finger. The patient continues to have his neuromuscular discomfort involving the shoulder and cervical spine. The patient will be set up for nerve conduction studies of both arms, and one leg. EMG evaluation will be done on one arm. It only carpal tunnel syndrome is noted, the patient may be set up for a hand surgeon referral. An ulnar neuropathy needs to be excluded. He will follow-up for the EMG study. The patient will continue the TENS unit.  [name]. Keith Orion Mole MD 04/02/2014 8:22 PM  [facility] [address] [place], [place] [id number]  Phone [phone] Fax [phone]

## 2014-04-02 NOTE — Patient Instructions (Signed)
Carpal Tunnel Syndrome The carpal tunnel is a narrow area located on the palm side of your wrist. The tunnel is formed by the wrist bones and ligaments. Nerves, blood vessels, and tendons pass through the carpal tunnel. Repeated wrist motion or certain diseases may cause swelling within the tunnel. This swelling pinches the main nerve in the wrist (median nerve) and causes the painful hand and arm condition called carpal tunnel syndrome. CAUSES   Repeated wrist motions.  Wrist injuries.  Certain diseases like arthritis, diabetes, alcoholism, hyperthyroidism, and kidney failure.  Obesity.  Pregnancy. SYMPTOMS   A "pins and needles" feeling in your fingers or hand, especially in your thumb, index and middle fingers.  Tingling or numbness in your fingers or hand.  An aching feeling in your entire arm, especially when your wrist and elbow are bent for long periods of time.  Wrist pain that goes up your arm to your shoulder.  Pain that goes down into your palm or fingers.  A weak feeling in your hands. DIAGNOSIS  Your health care provider will take your history and perform a physical exam. An electromyography test may be needed. This test measures electrical signals sent out by your nerves into the muscles. The electrical signals are usually slowed by carpal tunnel syndrome. You may also need X-rays. TREATMENT  Carpal tunnel syndrome may clear up by itself. Your health care provider may recommend a wrist splint or medicine such as a nonsteroidal anti-inflammatory medicine. Cortisone injections may help. Sometimes, surgery may be needed to free the pinched nerve.  HOME CARE INSTRUCTIONS   Take all medicine as directed by your health care provider. Only take over-the-counter or prescription medicines for pain, discomfort, or fever as directed by your health care provider.  If you were given a splint to keep your wrist from bending, wear it as directed. It is important to wear the splint at  night. Wear the splint for as long as you have pain or numbness in your hand, arm, or wrist. This may take 1 to 2 months.  Rest your wrist from any activity that may be causing your pain. If your symptoms are work-related, you may need to talk to your employer about changing to a job that does not require using your wrist.  Put ice on your wrist after long periods of wrist activity.  Put ice in a plastic bag.  Place a towel between your skin and the bag.  Leave the ice on for 15-20 minutes, 03-04 times a day.  Keep all follow-up visits as directed by your health care provider. This includes any orthopedic referrals, physical therapy, and rehabilitation. Any delay in getting necessary care could result in a delay or failure of your condition to heal. SEEK IMMEDIATE MEDICAL CARE IF:   You have new, unexplained symptoms.  Your symptoms get worse and are not helped or controlled with medicines. MAKE SURE YOU:   Understand these instructions.  Will watch your condition.  Will get help right away if you are not doing well or get worse. Document Released: 05/21/2000 Document Revised: 10/08/2013 Document Reviewed: 04/09/2011 Exit[employer] Patient Information 2015 [employer]. This information is not intended to replace advice given to you by your health care provider. Make sure you discuss any questions you have with your health care provider.  

## 2014-04-03 ENCOUNTER — Telehealth: Payer: Self-pay | Admitting: Neurology

## 2014-04-03 NOTE — Telephone Encounter (Signed)
Matthew [name] with [employer] @ [phone], stated diagnosis code M47.812 need to be added to form for TENS Unit.  Questioning if form has been received or if another form need to be re faxed.  Please call and advise.

## 2014-04-03 NOTE — Telephone Encounter (Signed)
I was unable to connect with [employer] at this extension, but updated order has been faxed for the TENS unit and confirmation received.

## 2014-04-04 ENCOUNTER — Ambulatory Visit: Payer: Medicare Other | Admitting: Physical Therapy

## 2014-04-04 DIAGNOSIS — Z5189 Encounter for other specified aftercare: Secondary | ICD-10-CM | POA: Diagnosis not present

## 2014-04-16 ENCOUNTER — Encounter: Payer: Self-pay | Admitting: Neurology

## 2014-04-16 ENCOUNTER — Encounter (INDEPENDENT_AMBULATORY_CARE_PROVIDER_SITE_OTHER): Payer: Self-pay

## 2014-04-16 ENCOUNTER — Ambulatory Visit (INDEPENDENT_AMBULATORY_CARE_PROVIDER_SITE_OTHER): Payer: Medicare Other | Admitting: Neurology

## 2014-04-16 DIAGNOSIS — G5601 Carpal tunnel syndrome, right upper limb: Secondary | ICD-10-CM

## 2014-04-16 DIAGNOSIS — M47812 Spondylosis without myelopathy or radiculopathy, cervical region: Secondary | ICD-10-CM

## 2014-04-16 DIAGNOSIS — Z0289 Encounter for other administrative examinations: Secondary | ICD-10-CM

## 2014-04-16 DIAGNOSIS — G5603 Carpal tunnel syndrome, bilateral upper limbs: Secondary | ICD-10-CM

## 2014-04-16 DIAGNOSIS — G5602 Carpal tunnel syndrome, left upper limb: Secondary | ICD-10-CM

## 2014-04-16 DIAGNOSIS — R269 Unspecified abnormalities of gait and mobility: Secondary | ICD-10-CM

## 2014-04-16 NOTE — Procedures (Signed)
     HISTORY:  Matthew [name] is a 63 year old gentleman with a history of bilateral carpal tunnel syndrome documented in December 2014 by nerve conduction studies. The patient has had ongoing symptoms in both hands, that seems to be improved somewhat with the use of a wrist splint. The patient more recently has developed numbness in the ulnar aspect of the right hand. He does have a history of significant cervical spondylosis. The patient is being evaluated for a possible neuropathy or a cervical radiculopathy.  NERVE CONDUCTION STUDIES:  Nerve conduction studies were performed on both upper extremities. The distal motor latencies for the median nerves were prolonged bilaterally, with normal motor amplitudes for these nerves. The distal motor latencies and motor amplitudes for the ulnar nerves were normal bilaterally. The F wave latencies for the median nerves were prolonged on the right, normal on the left, and normal for the ulnar nerves bilaterally. The nerve conduction velocities for the median and ulnar nerves were normal bilaterally. The sensory latencies for the median nerves were prolonged bilaterally, normal for the ulnar nerves bilaterally.  Nerve conduction studies were performed on both lower extremities. The distal motor latencies and motor amplitudes for the peroneal nerves bilaterally and for the right posterior tibial nerve were normal with normal nerve conduction velocities for these nerves. The right H reflex latency was normal. The sensory latencies for the peroneal nerves were normal bilaterally.  EMG STUDIES:  EMG study was performed on the right upper extremity:  The first dorsal interosseous muscle reveals 2 to 4 K units with full recruitment. No fibrillations or positive waves were noted. The abductor pollicis brevis muscle reveals 2 to 4 K units with full recruitment. No fibrillations or positive waves were noted. The extensor indicis proprius muscle reveals 1 to 3 K  units with full recruitment. No fibrillations or positive waves were noted. The pronator teres muscle reveals 2 to 3 K units with full recruitment. No fibrillations or positive waves were noted. The biceps muscle reveals 1 to 2 K units with full recruitment. No fibrillations or positive waves were noted. The triceps muscle reveals 2 to 4 K units with full recruitment. No fibrillations or positive waves were noted. The anterior deltoid muscle reveals 2 to 3 K units with full recruitment. No fibrillations or positive waves were noted. The cervical paraspinal muscles were tested at 2 levels. No abnormalities of insertional activity were seen at either level tested. There was good relaxation.   IMPRESSION:  Nerve conduction studies done on all 4 extremities shows evidence of bilateral mild carpal tunnel syndrome. There is no evidence of a right ulnar neuropathy. As compared to the prior study done in December 2014, there has been little change on this study regarding the severity of the carpal tunnel syndrome. There is no evidence of an overlying peripheral neuropathy. EMG evaluation of the right upper extremity is unremarkable, without evidence of an overlying cervical radiculopathy.  Matthew [name]. Matthew Davarious Tumbleson MD 04/16/2014 11:03 AM  [facility] [address] [place], [place] [id number]  Phone [phone] Fax [phone]

## 2014-04-16 NOTE — Progress Notes (Signed)
Matthew [name] is a 63 year old gentleman with a history of cervical spondylosis and bilateral carpal tunnel syndrome. He recently noted some numbness in the ulnar aspect of the right hand. He comes in for a repeat EMG and nerve conduction study.  Nerve conduction studies show bilateral carpal tunnel syndrome, unchanged from December 2014. No evidence of a right ulnar neuropathy is seen. EMG evaluation of the right upper extremity is unremarkable.  The patient will be referred to a hand surgeon, and he will follow-up through this office in about 6 months. The patient is noting some issues with restless leg syndrome, he may need to go on medications such as Requip in the future.

## 2014-04-24 ENCOUNTER — Encounter: Payer: Self-pay | Admitting: Neurology

## 2014-04-30 ENCOUNTER — Encounter: Payer: Self-pay | Admitting: Neurology

## 2014-06-10 ENCOUNTER — Ambulatory Visit (INDEPENDENT_AMBULATORY_CARE_PROVIDER_SITE_OTHER): Payer: Medicare Other | Admitting: Adult Health

## 2014-06-10 ENCOUNTER — Encounter: Payer: Self-pay | Admitting: Adult Health

## 2014-06-10 VITALS — BP 155/71 | HR 69 | Ht 72.0 in | Wt 178.0 lb

## 2014-06-10 DIAGNOSIS — R269 Unspecified abnormalities of gait and mobility: Secondary | ICD-10-CM

## 2014-06-10 DIAGNOSIS — G5601 Carpal tunnel syndrome, right upper limb: Secondary | ICD-10-CM

## 2014-06-10 DIAGNOSIS — G5602 Carpal tunnel syndrome, left upper limb: Secondary | ICD-10-CM

## 2014-06-10 DIAGNOSIS — G5603 Carpal tunnel syndrome, bilateral upper limbs: Secondary | ICD-10-CM

## 2014-06-10 DIAGNOSIS — M47812 Spondylosis without myelopathy or radiculopathy, cervical region: Secondary | ICD-10-CM

## 2014-06-10 MED ORDER — PREGABALIN 100 MG PO CAPS: 100.0000 mg | ORAL_CAPSULE | Freq: Every day | ORAL | Status: DC

## 2014-06-10 MED ORDER — TIZANIDINE HCL 2 MG PO TABS: 2.0000 mg | ORAL_TABLET | Freq: Three times a day (TID) | ORAL | Status: DC | PRN

## 2014-06-10 NOTE — Progress Notes (Signed)
PATIENT: Matthew [name] DOB: 1950-11-15  REASON FOR VISIT: follow up HISTORY FROM: patient  HISTORY OF PRESENT ILLNESS: Matthew [name] is a 64 year old male with a history of cervical spondylosis and chronic spine and shoulder discomfort. He returns today for follow-up. He is currently taking tizanidine, robaxin and Lyrica. He had NCS with EMG that showed mild bilateral carpal tunnel. He was having numbness in the hands. He was referred to a hand surgeon who gave him steriod injections in the wrist. He states that he got more relief with those injections than any medication he has ever taken. He states that his walking and balance has improved. He was able to drive his motorcycle to the office today. He has an appointment with the hand surgeon at the end of the month. The patient has decreased his medication. He states that he is only taking one tablet of lyrica a day and one tablet of  Tizanidine daily. Overall the patient states that he has improved " a lot" since the last visit.    HISTORY 04/02/14 (WILLIS):Matthew [name] is a 64 year old right-handed white male with a history of cervical spondylosis, and chronic cervical spine and shoulder discomfort. The patient has degenerative arthritis that is causing increasing discomfort for him involving the knees and hands. The patient indicates that weather changes will result in increased discomfort with the arthritis. The patient has bilateral carpal tunnel syndrome that was mild on the study done in December 2014. The patient currently reports increasing discomfort in the right fifth finger with severe numbness that is not responding to the use of wrist splints. The patient has severe limitation of range movement of the cervical spine. He is followed by a physical therapist, working on balance issues. The patient has paresthesias in legs, and he has evidence of myelomalacia in the cervical spine. He returns this office for evaluation. He is on tizanidine,  Robaxin, and Lyrica with some benefit. A TENS unit trial did result in some benefit, and the patient will be getting a TENS unit on a permanent basis. The patient will use a cane for ambulation.   REVIEW OF SYSTEMS: Out of a complete 14 system review of symptoms, the patient complains only of the following symptoms, and all other reviewed systems are negative.  Joint pain, neck pain, neck stiffness, restless leg, weakness, nervous/anxious  ALLERGIES: Allergies  Allergen Reactions  . Penicillins   . Codeine     HOME MEDICATIONS: Outpatient Prescriptions Prior to Visit  Medication Sig Dispense Refill  . DULoxetine (CYMBALTA) 30 MG capsule Take 15 mg by mouth daily.     . hydrochlorothiazide (HYDRODIURIL) 25 MG tablet Take 25 mg by mouth daily.    Marland Kitchen ibuprofen (ADVIL,MOTRIN) 600 MG tablet TAKE 1 TABLET BY MOUTH EVERY 8 HOURS AS NEEDED 270 tablet 0  . lisinopril (PRINIVIL,ZESTRIL) 10 MG tablet Take 10 mg by mouth daily.    . methocarbamol (ROBAXIN) 500 MG tablet Take 500 mg by mouth 3 (three) times daily.    . pregabalin (LYRICA) 100 MG capsule Take 1 capsule (100 mg total) by mouth 2 (two) times daily. 60 capsule 5  . tiZANidine (ZANAFLEX) 4 MG tablet Take 1 tablet (4 mg total) by mouth 3 (three) times daily. 90 tablet 5   No facility-administered medications prior to visit.    PAST MEDICAL HISTORY: Past Medical History  Diagnosis Date  . Anxiety disorder   . Hypertension   . Joint pain, knee   . Pain in  joint, shoulder region   . Post traumatic stress disorder   . Spinal stenosis in cervical region   . Spondylosis   . Stroke   . Cervical spondylosis without myelopathy 05/07/2013  . Gait disorder 05/07/2013  . Foot fracture     right foot  . Carpal tunnel syndrome     Bilateral  . Restless leg syndrome     PAST SURGICAL HISTORY: Past Surgical History  Procedure Laterality Date  . Knee surgery  1980  . Neck surgery      2009 or 2010    FAMILY HISTORY: Family History    Problem Relation Age of Onset  . Hypertension Other   . Stroke Other   . Diabetes Other   . Cancer Brother 60    brain tumor  . Cancer - Lung Mother   . Diabetes Father     SOCIAL HISTORY: History   Social History  . Marital Status: Divorced    Spouse Name: N/A    Number of Children: 3  . Years of Education: college   Occupational History  .      retired   Social History Main Topics  . Smoking status: Former Games developer  . Smokeless tobacco: Never Used     Comment: quit 8 years  ago  . Alcohol Use: No     Comment: quit drinking 8 years ago  . Drug Use: No  . Sexual Activity: Not on file   Other Topics Concern  . Not on file   Social History Narrative   Patient is right handed, consumes one cup of caffeine daily, resides in home with roommate.      PHYSICAL EXAM  Filed Vitals:   06/10/14 1316  BP: 155/71  Pulse: 69  Height: 6' (1.829 m)  Weight: 178 lb (80.74 kg)   Body mass index is 24.14 kg/(m^2).  Generalized: Well developed, in no acute distress   Neurological examination  Mentation: Alert oriented to time, place, history taking. Follows all commands speech and language fluent Cranial nerve II-XII: Pupils were equal round reactive to light. Extraocular movements were full, visual field were full on confrontational test. Facial sensation and strength were normal. Uvula tongue midline. Head turning and shoulder shrug  were normal and symmetric. Motor: The motor testing reveals 5 over 5 strength of all 4 extremities. Good symmetric motor tone is noted throughout.  Sensory: Sensory testing is intact to soft touch on all 4 extremities. No evidence of extinction is noted.  Coordination: Cerebellar testing reveals good finger-nose-finger and heel-to-shin bilaterally.  Gait and station: Gait is normal. Tandem gait is minimally unsteady. Romberg is negative. No drift is seen.  Reflexes: Deep tendon reflexes are symmetric and normal bilaterally.    DIAGNOSTIC  DATA (LABS, IMAGING, TESTING) - I reviewed patient records, labs, notes, testing and imaging myself where available.  Lab Results  Component Value Date   WBC 7.6 05/19/2010   HGB 16.0 05/19/2010   HCT 46.6 05/19/2010   MCV 89.3 05/19/2010   PLT 226 05/19/2010      ASSESSMENT AND PLAN 64 y.o. year old male  has a past medical history of Anxiety disorder; Hypertension; Joint pain, knee; Pain in joint, shoulder region; Post traumatic stress disorder; Spinal stenosis in cervical region; Spondylosis; Stroke; Cervical spondylosis without myelopathy (05/07/2013); Gait disorder (05/07/2013); Foot fracture; Carpal tunnel syndrome; and Restless leg syndrome. here with:  1. Cervical spondylosis 2. Bilateral carpal tunnel syndrome 3. Gait disorder.   Overall the patient has improved.  He states that the hand injections that he received from the hand surgeon has been the most beneficial. He was able to reduce his medication. He also states that his gait and balance has improved. He went to therapy a while back and found that helpful as well. He will continue taking Lyrica and tizanidine. No refills needed today. If his symptoms worsen or he develops new symptoms he should let us know. Otherwise he will follow-up in 6 months.    Matthew Penny, MSN, NP-C 06/10/2014, 1:16 PM [facility] [address] [place], [place] [address] [phone]  Note: This document was prepared with digital dictation and possible smart phrase technology. Any transcriptional errors that result from this process are unintentional.

## 2014-06-10 NOTE — Progress Notes (Signed)
I have read the note, and I agree with the clinical assessment and plan.  Matthew [name],Matthew [name]   

## 2014-06-10 NOTE — Patient Instructions (Signed)
Continue using the Tizanidine and lyrica.  Follow-up with Hand Surgeon at the end of this month.  Let us know if your symptoms worsen or you develop new symptoms.

## 2014-08-02 ENCOUNTER — Other Ambulatory Visit: Payer: Self-pay | Admitting: Neurology

## 2014-12-11 ENCOUNTER — Encounter: Payer: Self-pay | Admitting: Adult Health

## 2014-12-11 ENCOUNTER — Ambulatory Visit (INDEPENDENT_AMBULATORY_CARE_PROVIDER_SITE_OTHER): Payer: Medicare Other | Admitting: Adult Health

## 2014-12-11 VITALS — BP 134/81 | HR 58 | Ht 72.0 in | Wt 175.0 lb

## 2014-12-11 DIAGNOSIS — M62838 Other muscle spasm: Secondary | ICD-10-CM | POA: Diagnosis not present

## 2014-12-11 DIAGNOSIS — M47812 Spondylosis without myelopathy or radiculopathy, cervical region: Secondary | ICD-10-CM

## 2014-12-11 DIAGNOSIS — R202 Paresthesia of skin: Secondary | ICD-10-CM

## 2014-12-11 MED ORDER — DIAZEPAM 2 MG PO TABS: 2.0000 mg | ORAL_TABLET | Freq: Every evening | ORAL | Status: DC | PRN

## 2014-12-11 NOTE — Progress Notes (Signed)
I have read the note, and I agree with the clinical assessment and plan.  WILLIS,CHARLES KEITH   

## 2014-12-11 NOTE — Patient Instructions (Signed)
Try taking Valium 2 mg at bedtime as needed for muscle spasms.   Diazepam tablets What is this medicine? DIAZEPAM (dye AZ e pam) is a benzodiazepine. It is used to treat anxiety and nervousness. It also can help treat alcohol withdrawal, relax muscles, and treat certain types of seizures. This medicine may be used for other purposes; ask your health care provider or pharmacist if you have questions. COMMON BRAND NAME(S): Valium What should I tell my health care provider before I take this medicine? They need to know if you have any of these conditions -an alcohol or drug abuse problem -bipolar disorder, depression, psychosis or other mental health condition -glaucoma -kidney or liver disease -lung or breathing disease -myasthenia gravis -Parkinson's disease -seizures or a history of seizures -suicidal thoughts -an unusual or allergic reaction to diazepam, other benzodiazepines, foods, dyes, or preservatives -pregnant or trying to get pregnant -breast-feeding How should I use this medicine? Take this medicine by mouth with a glass of water. Follow the directions on the prescription label. If this medicine upsets your stomach, take it with food or milk. Take your doses at regular intervals. Do not take your medicine more often than directed. If you have been taking this medicine regularly for some time, do not suddenly stop taking it. You must gradually reduce the dose or you may get severe side effects. Ask your doctor or health care professional for advice. Even after you stop taking this medicine it can still affect your body for several days. Talk to your pediatrician regarding the use of this medicine in children. Special care may be needed. Overdosage: If you think you have taken too much of this medicine contact a poison control center or emergency room at once. NOTE: This medicine is only for you. Do not share this medicine with others. What if I miss a dose? If you miss a dose, take  it as soon as you can. If it is almost time for your next dose, take only that dose. Do not take double or extra doses. What may interact with this medicine? -cimetidine -grapefruit juice -herbal or dietary supplements like kava kava, melatonin, St. John's Wort, or valerian -medicines for anxiety or sleeping problems, like alprazolam, lorazepam, or triazolam -medicines for depression, mental problems or psychiatric disturbances -medicines for HIV infection or AIDS -prescription pain medicines -rifampin, rifapentine, or rifabutin -some medicines for seizures like carbamazepine, phenobarbital, phenytoin, or primidone This list may not describe all possible interactions. Give your health care provider a list of all the medicines, herbs, non-prescription drugs, or dietary supplements you use. Also tell them if you smoke, drink alcohol, or use illegal drugs. Some items may interact with your medicine. What should I watch for while using this medicine? Visit your doctor or health care professional for regular checks on your progress. Your body can become dependent on this medicine. Ask your doctor or health care professional if you still need to take it. You may get drowsy or dizzy. Do not drive, use machinery, or do anything that needs mental alertness until you know how this medicine affects you. To reduce the risk of dizzy and fainting spells, do not stand or sit up quickly, especially if you are an older patient. Alcohol may increase dizziness and drowsiness. Avoid alcoholic drinks. Do not treat yourself for coughs, colds or allergies without asking your doctor or health care professional for advice. Some ingredients can increase possible side effects. What side effects may I notice from receiving this medicine? Side  effects that you should report to your doctor or health care professional as soon as possible: -allergic reactions like skin rash, itching or hives, swelling of the face, lips, or  tongue -angry, confused, depressed, other mood changes -breathing problems -feeling faint or lightheaded, falls -muscle cramps -problems with balance, talking, walking -restlessness -tremors -trouble passing urine or change in the amount of urine -unusually weak or tired Side effects that usually do not require medical attention (report to your doctor or health care professional if they continue or are bothersome): -difficulty sleeping, nightmares -dizziness, drowsiness, clumsiness, or unsteadiness, a hangover effect -headache -nausea, vomiting This list may not describe all possible side effects. Call your doctor for medical advice about side effects. You may report side effects to FDA at 1-800-FDA-1088. Where should I keep my medicine? Keep out of the reach of children. This medicine can be abused. Keep your medicine in a safe place to protect it from theft. Do not share this medicine with anyone. Selling or giving away this medicine is dangerous and against the law. Store at room temperature between 15 and 30 degrees C (59 and 86 degrees F). Protect from light. Keep container tightly closed. Throw away any unused medicine after the expiration date. NOTE: This sheet is a summary. It may not cover all possible information. If you have questions about this medicine, talk to your doctor, pharmacist, or health care provider.  2015, [employer]vier/Gold Standard. (2007-09-11 16:57:35)

## 2014-12-11 NOTE — Progress Notes (Signed)
PATIENT: Matthew [name] DOB: 05/28/1951  REASON FOR VISIT: follow up HISTORY FROM: patient  HISTORY OF PRESENT ILLNESS: Matthew [name] is a 64 year old male with a history of cervical spondylosis and chronic spine and shoulder discomfort. He returns today for follow-up. The patient states that he is no longer taking the tizanidine, Cymbalta or Robaxin. He states he is also decreased his Lyrica to 100 mg at bedtime. He states that at the last visit he was feeling very good but since then he has began to have muscle spasms in his legs and toes at bedtime. He states he is also having some burning sensations in the bottom of the feet. He states that last night he had to get up and put his feet in warm water to help relieve the pain. The patient states that in the past Dr. Venetia MaxonStern had him on Valium and that helped his spasms. Patient states that he is trying to wean off Lyrica because he does not like the side effects. He states the Lyrica makes him urinate frequently. Patient states that since he had the injections in his hand he no longer has issues with carpal tunnel syndrome. He denies any new medical issues. Returns today for an evaluation.  HISTORY 06/10/14: Matthew [name] is a 64 year old male with a history of cervical spondylosis and chronic spine and shoulder discomfort. He returns today for follow-up. He is currently taking tizanidine, robaxin and Lyrica. He had NCS with EMG that showed mild bilateral carpal tunnel. He was having numbness in the hands. He was referred to a hand surgeon who gave him steriod injections in the wrist. He states that he got more relief with those injections than any medication he has ever taken. He states that his walking and balance has improved. He was able to drive his motorcycle to the office today. He has an appointment with the hand surgeon at the end of the month. The patient has decreased his medication. He states that he is only taking one tablet of lyrica a day and  one tablet of Tizanidine daily. Overall the patient states that he has improved " a lot" since the last visit.   HISTORY 04/02/14 (WILLIS):Matthew [name] is a 64 year old right-handed white male with a history of cervical spondylosis, and chronic cervical spine and shoulder discomfort. The patient has degenerative arthritis that is causing increasing discomfort for him involving the knees and hands. The patient indicates that weather changes will result in increased discomfort with the arthritis. The patient has bilateral carpal tunnel syndrome that was mild on the study done in December 2014. The patient currently reports increasing discomfort in the right fifth finger with severe numbness that is not responding to the use of wrist splints. The patient has severe limitation of range movement of the cervical spine. He is followed by a physical therapist, working on balance issues. The patient has paresthesias in legs, and he has evidence of myelomalacia in the cervical spine. He returns this office for evaluation. He is on tizanidine, Robaxin, and Lyrica with some benefit. A TENS unit trial did result in some benefit, and the patient will be getting a TENS unit on a permanent basis. The patient will use a cane for ambulation.   REVIEW OF SYSTEMS: Out of a complete 14 system review of symptoms, the patient complains only of the following symptoms, and all other reviewed systems are negative.  Light sensitivity, cold intolerance, frequency of urination, urgency, frequent waking, muscle spasms, muscle cramps,  walking difficulty, neck pain, neck stiffness, bruise/bleed easily, dizziness, headache, weakness, depression, nervous/anxious  ALLERGIES: Allergies  Allergen Reactions  . Penicillins   . Codeine     HOME MEDICATIONS: Outpatient Prescriptions Prior to Visit  Medication Sig Dispense Refill  . ibuprofen (ADVIL,MOTRIN) 600 MG tablet TAKE 1 TABLET BY MOUTH EVERY 8 HOURS AS NEEDED 270 tablet 0  .  pregabalin (LYRICA) 100 MG capsule Take 1 capsule (100 mg total) by mouth daily. 60 capsule 3  . hydrochlorothiazide (HYDRODIURIL) 25 MG tablet Take 25 mg by mouth daily.    Marland Kitchen lisinopril (PRINIVIL,ZESTRIL) 10 MG tablet Take 10 mg by mouth daily.    Marland Kitchen tiZANidine (ZANAFLEX) 2 MG tablet Take 1 tablet (2 mg total) by mouth 3 (three) times daily as needed for muscle spasms. (Patient not taking: Reported on 12/11/2014) 30 tablet 0  . DULoxetine (CYMBALTA) 30 MG capsule Take 15 mg by mouth daily.      No facility-administered medications prior to visit.    PAST MEDICAL HISTORY: Past Medical History  Diagnosis Date  . Anxiety disorder   . Hypertension   . Joint pain, knee   . Pain in joint, shoulder region   . Post traumatic stress disorder   . Spinal stenosis in cervical region   . Spondylosis   . Stroke   . Cervical spondylosis without myelopathy 05/07/2013  . Gait disorder 05/07/2013  . Foot fracture     right foot  . Carpal tunnel syndrome     Bilateral  . Restless leg syndrome     PAST SURGICAL HISTORY: Past Surgical History  Procedure Laterality Date  . Knee surgery  1980  . Neck surgery      2009 or 2010    FAMILY HISTORY: Family History  Problem Relation Age of Onset  . Hypertension Other   . Stroke Other   . Diabetes Other   . Cancer Brother 60    brain tumor  . Cancer - Lung Mother   . Diabetes Father     PHYSICAL EXAM  Filed Vitals:   12/11/14 1359  BP: 134/81  Pulse: 58  Height: 6' (1.829 m)  Weight: 175 lb (79.379 kg)   Body mass index is 23.73 kg/(m^2).  Generalized: Well developed, in no acute distress   Neurological examination  Mentation: Alert oriented to time, place, history taking. Follows all commands speech and language fluent Cranial nerve II-XII: Pupils were equal round reactive to light. Extraocular movements were full, visual field were full on confrontational test. Facial sensation and strength were normal. Uvula tongue midline. Head  turning and shoulder shrug  were normal and symmetric. Motor: The motor testing reveals 5 over 5 strength of all 4 extremities. Give away weakness noted in left leg. Good symmetric motor tone is noted throughout.  Sensory: Sensory testing is intact to soft touch on all 4 extremities. No evidence of extinction is noted.  Coordination: Cerebellar testing reveals good finger-nose-finger and heel-to-shin bilaterally.  Gait and station: Gait is normal. Tandem gait is normal. Romberg is negative. No drift is seen.  Reflexes: Deep tendon reflexes are symmetric and normal bilaterally.     DIAGNOSTIC DATA (LABS, IMAGING, TESTING) - I reviewed patient records, labs, notes, testing and imaging myself where available.  ASSESSMENT AND PLAN 64 y.o. year old male  has a past medical history of Anxiety disorder; Hypertension; Joint pain, knee; Pain in joint, shoulder region; Post traumatic stress disorder; Spinal stenosis in cervical region; Spondylosis; Stroke; Cervical spondylosis without myelopathy (  05/07/2013); Gait disorder (05/07/2013); Foot fracture; Carpal tunnel syndrome; and Restless leg syndrome. here with:  1. Cervical spondylosis without myelopathy 2. Muscle spasms and lower legs 3. Tingling in feet.   The patient is reporting muscle spasms in the legs and feet at bedtime. He states that this is affecting his sleep. In the past he has tried Valium and that is worked well for him. I will give the patient a low-dose of Valium 2 mg tablets to use at bedtime. Patient will let me know if this is beneficial for him. I did explain to the patient that Valium would not help the burning sensation in the bottom of the feet. However at this time the patient does not want to try any additional medication. He is still taking Lyrica but is considering weaning off of this medication because he feels that it makes him urinate frequently. Patient advised that if his symptoms worsen or he develops new symptoms he should  let us know. Otherwise he will follow-up in 3-4 months or sooner if needed.    Butch Penny, MSN, NP-C 12/11/2014, 2:05 PM [facility] [address], [address] [place], [place] [address] [phone]  Note: This document was prepared with digital dictation and possible smart phrase technology. Any transcriptional errors that result from this process are unintentional.

## 2014-12-18 ENCOUNTER — Telehealth: Payer: Self-pay

## 2014-12-18 ENCOUNTER — Telehealth: Payer: Self-pay | Admitting: Adult Health

## 2014-12-18 MED ORDER — DIAZEPAM 5 MG PO TABS: 5.0000 mg | ORAL_TABLET | Freq: Every evening | ORAL | Status: DC | PRN

## 2014-12-18 NOTE — Telephone Encounter (Signed)
Pt called , he was told to call and give results about taking the Valium samples  He says that the  is not working very well. Wants to know if the dosage could be increased. Wants to know if he could be referred to a specialist, pediatrist.

## 2014-12-18 NOTE — Telephone Encounter (Signed)
I have written a prescription for Valium 5 mg 1 tablet at bedtime PRN for muscle spasms. Please call and let the patient know. Thanks.

## 2014-12-18 NOTE — Telephone Encounter (Signed)
Patient calling and relayed that the Valium in helping but he needs something a little stronger . Patient states he is not sleeping. Patient left foot is cramping more.

## 2014-12-18 NOTE — Telephone Encounter (Signed)
Called patient and relayed about Valium he understood. Patient Stated Thank Aundra MilletMegan and can Aundra MilletMegan send me to a foot specialist.

## 2014-12-18 NOTE — Telephone Encounter (Signed)
He should see his PCP for regarding foot specialist.

## 2014-12-19 NOTE — Telephone Encounter (Signed)
Called patient left message to call his PCP for referral for foot specialist. Any questions or concerns to please call the office back.

## 2015-01-29 ENCOUNTER — Other Ambulatory Visit: Payer: Self-pay | Admitting: Adult Health

## 2015-01-29 NOTE — Telephone Encounter (Signed)
Rx has been signed and faxed  

## 2015-01-29 NOTE — Telephone Encounter (Signed)
Patient has follow up scheduled in Nov

## 2015-02-25 ENCOUNTER — Other Ambulatory Visit: Payer: Self-pay | Admitting: Adult Health

## 2015-03-27 ENCOUNTER — Other Ambulatory Visit: Payer: Self-pay | Admitting: Adult Health

## 2015-03-27 NOTE — Telephone Encounter (Signed)
Rx signed and faxed.

## 2015-04-15 ENCOUNTER — Encounter: Payer: Self-pay | Admitting: Neurology

## 2015-04-15 ENCOUNTER — Ambulatory Visit (INDEPENDENT_AMBULATORY_CARE_PROVIDER_SITE_OTHER): Payer: Medicare Other | Admitting: Neurology

## 2015-04-15 VITALS — BP 140/71 | HR 69 | Ht 72.0 in | Wt 174.0 lb

## 2015-04-15 DIAGNOSIS — M47812 Spondylosis without myelopathy or radiculopathy, cervical region: Secondary | ICD-10-CM

## 2015-04-15 DIAGNOSIS — R269 Unspecified abnormalities of gait and mobility: Secondary | ICD-10-CM

## 2015-04-15 MED ORDER — DIAZEPAM 5 MG PO TABS: ORAL_TABLET | ORAL | Status: DC

## 2015-04-15 MED ORDER — PREGABALIN 100 MG PO CAPS: 100.0000 mg | ORAL_CAPSULE | Freq: Every day | ORAL | Status: DC

## 2015-04-15 NOTE — Progress Notes (Signed)
Reason for visit: Peripheral neuropathy  Matthew [name] is an 64 y.o. male  History of present illness:  Matthew [name] is a 65 year old right-handed white male with a history of cervical spondylosis, paresthesias in the feet likely due to the cervical myelopathy. He has bilateral carpal tunnel syndrome. The patient has some degree of a restless legs type syndrome. He reports some discomfort in the legs at night at times. Addition of diazepam at 5 mg has been helpful for him. The patient also takes Lyrica, 100 mg daily. He finds that this has been helpful. He denies any significant balance issues, he has not had any falls. Overall, he thinks he is doing much better recently, he sleeps well at night, and feels better during the day. He returns for an evaluation.  Past Medical History  Diagnosis Date  . Anxiety disorder   . Hypertension   . Joint pain, knee   . Pain in joint, shoulder region   . Post traumatic stress disorder   . Spinal stenosis in cervical region   . Spondylosis   . Stroke (HCC)   . Cervical spondylosis without myelopathy 05/07/2013  . Gait disorder 05/07/2013  . Foot fracture     right foot  . Carpal tunnel syndrome     Bilateral  . Restless leg syndrome     Past Surgical History  Procedure Laterality Date  . Knee surgery  1980  . Neck surgery      2009 or 2010    Family History  Problem Relation Age of Onset  . Hypertension Other   . Stroke Other   . Diabetes Other   . Cancer Brother 60    brain tumor  . Cancer - Lung Mother   . Diabetes Father     Social history:  reports that he has quit smoking. He has never used smokeless tobacco. He reports that he does not drink alcohol or use illicit drugs.    Allergies  Allergen Reactions  . Penicillins   . Codeine     Medications:  Prior to Admission medications   Medication Sig Start Date End Date Taking? Authorizing Provider  diazepam (VALIUM) 5 MG tablet TAKE 1 TABLET BY MOUTH AT BEDTIME AS  NEEDED FOR MUSCLE SPASMS 03/27/15  Yes Butch Penny, NP  ibuprofen (ADVIL,MOTRIN) 600 MG tablet TAKE 1 TABLET BY MOUTH EVERY 8 HOURS AS NEEDED   Yes York Spaniel, MD  LYRICA 100 MG capsule TAKE ONE CAPSULE BY MOUTH DAILY 03/27/15  Yes Butch Penny, NP    ROS:  Out of a complete 14 system review of symptoms, the patient complains only of the following symptoms, and all other reviewed systems are negative.  Chills Runny nose Cold intolerance Restless legs Achy muscles, muscle cramps, walking difficulty, neck pain, neck stiffness  Blood pressure 140/71, pulse 69, height 6' (1.829 m), weight 174 lb (78.926 kg).  Physical Exam  General: The patient is alert and cooperative at the time of the examination.  Skin: No significant peripheral edema is noted.   Neurologic Exam  Mental status: The patient is alert and oriented x 3 at the time of the examination. The patient has apparent normal recent and remote memory, with an apparently normal attention span and concentration ability.   Cranial nerves: Facial symmetry is present. Speech is normal, no aphasia or dysarthria is noted. Extraocular movements are full. Visual fields are full.  Motor: The patient has good strength in all 4 extremities.  Sensory examination:  Soft touch sensation is symmetric on the face, arms, and legs.  Coordination: The patient has good finger-nose-finger and heel-to-shin bilaterally.  Gait and station: The patient has a normal gait. Tandem gait is normal. Romberg is negative. No drift is seen.  Reflexes: Deep tendon reflexes are symmetric.   Assessment/Plan:  1. Mild gait disorder  2. Cervical spondylosis with myelopathy  3. History of stroke  The patient overall is doing much better at this time. We will continue the diazepam and Lyrica, prescriptions were written for this. He will follow-up in 6 months, sooner if needed.  Matthew Palau. Keith Willis MD 04/15/2015 7:01 PM  [facility] [address] [place], [place] [id number]  Phone [phone] Fax [phone]

## 2015-04-15 NOTE — Patient Instructions (Signed)

## 2015-10-14 ENCOUNTER — Other Ambulatory Visit (HOSPITAL_COMMUNITY): Payer: Self-pay | Admitting: Nurse Practitioner

## 2015-10-14 DIAGNOSIS — R209 Unspecified disturbances of skin sensation: Secondary | ICD-10-CM

## 2015-10-15 ENCOUNTER — Ambulatory Visit (HOSPITAL_COMMUNITY)
Admission: RE | Admit: 2015-10-15 | Discharge: 2015-10-15 | Disposition: A | Discharge status: Home / Self Care | Payer: Medicare Other | Source: Ambulatory Visit | Attending: Nurse Practitioner | Admitting: Nurse Practitioner

## 2015-10-15 DIAGNOSIS — I739 Peripheral vascular disease, unspecified: Secondary | ICD-10-CM | POA: Diagnosis not present

## 2015-10-15 DIAGNOSIS — R208 Other disturbances of skin sensation: Secondary | ICD-10-CM | POA: Insufficient documentation

## 2015-10-15 DIAGNOSIS — R209 Unspecified disturbances of skin sensation: Secondary | ICD-10-CM

## 2015-10-15 DIAGNOSIS — I10 Essential (primary) hypertension: Secondary | ICD-10-CM | POA: Insufficient documentation

## 2015-10-15 NOTE — Progress Notes (Signed)
VASCULAR LAB PRELIMINARY  ARTERIAL  ABI completed:    RIGHT    LEFT    PRESSURE WAVEFORM  PRESSURE WAVEFORM  BRACHIAL 181 Triphasic BRACHIAL 169 Triphasic  DP 138 Dampened monophasic DP 158 Dampened monophasic  AT   AT    PT 148 Biphasic PT 164 Biphasic  PER   PER    GREAT TOE  NA GREAT TOE  NA    RIGHT LEFT  ABI 0.82 0.91   Bilateral ABIs are suggestive of mild arterial insufficiency at rest.  10/15/2015 11:49 AM Gertie FeyMichelle Janelly Switalski, RVT, RDCS, RDMS

## 2015-10-16 ENCOUNTER — Ambulatory Visit: Payer: Medicare Other | Admitting: Adult Health

## 2015-10-16 ENCOUNTER — Encounter (HOSPITAL_COMMUNITY): Payer: Medicare Other

## 2015-10-20 ENCOUNTER — Other Ambulatory Visit: Payer: Self-pay | Admitting: Neurology

## 2015-10-21 NOTE — Telephone Encounter (Signed)
Rx printed, signed, faxed to pharmacy. 

## 2015-11-04 ENCOUNTER — Ambulatory Visit: Payer: Medicare Other | Admitting: Adult Health

## 2015-11-13 ENCOUNTER — Ambulatory Visit (INDEPENDENT_AMBULATORY_CARE_PROVIDER_SITE_OTHER): Payer: Medicare Other | Admitting: Adult Health

## 2015-11-13 ENCOUNTER — Encounter: Payer: Self-pay | Admitting: Adult Health

## 2015-11-13 VITALS — BP 134/73 | HR 62 | Ht 72.0 in | Wt 165.2 lb

## 2015-11-13 DIAGNOSIS — R202 Paresthesia of skin: Secondary | ICD-10-CM

## 2015-11-13 DIAGNOSIS — R252 Cramp and spasm: Secondary | ICD-10-CM

## 2015-11-13 DIAGNOSIS — M47812 Spondylosis without myelopathy or radiculopathy, cervical region: Secondary | ICD-10-CM

## 2015-11-13 NOTE — Progress Notes (Signed)
PATIENT: Matthew [name] DOB: 09/22/1950  REASON FOR VISIT: follow up- cervical spondylosis, chronic spine and shoulder discomfort, paresthesias in the feet HISTORY FROM: patient  HISTORY OF PRESENT ILLNESS: Matthew [name] is a 65 her old male with a history of cervical spondylosis, chronic spine and shoulder discomfort and paresthesia in the feet. He returns today for follow-up. The patient states that Lyrica and Valium are working well for him. He states that he very rarely has muscle spasms as long as he takes Valium and eats mustard. He reports that his toes have been cold and he has burning and tingling sensations in the bottom of the feet. He reports that he was told he had Raynauds disease. He states the warm weather has been beneficial for this. He feels that his balance has also improved. He denies any falls. Overall he feels that he is doing better. He returns today for an evaluation.  HISTORY 12/11/14: Matthew [name] is a 65 year old male with a history of cervical spondylosis and chronic spine and shoulder discomfort. He returns today for follow-up. The patient states that he is no longer taking the tizanidine, Cymbalta or Robaxin. He states he is also decreased his Lyrica to 100 mg at bedtime. He states that at the last visit he was feeling very good but since then he has began to have muscle spasms in his legs and toes at bedtime. He states he is also having some burning sensations in the bottom of the feet. He states that last night he had to get up and put his feet in warm water to help relieve the pain. The patient states that in the past Dr. Venetia Maxon had him on Valium and that helped his spasms. Patient states that he is trying to wean off Lyrica because he does not like the side effects. He states the Lyrica makes him urinate frequently. Patient states that since he had the injections in his hand he no longer has issues with carpal tunnel syndrome. He denies any new medical issues. Returns  today for an evaluation.  HISTORY 06/10/14: Matthew [name] is a 65 year old male with a history of cervical spondylosis and chronic spine and shoulder discomfort. He returns today for follow-up. He is currently taking tizanidine, robaxin and Lyrica. He had NCS with EMG that showed mild bilateral carpal tunnel. He was having numbness in the hands. He was referred to a hand surgeon who gave him steriod injections in the wrist. He states that he got more relief with those injections than any medication he has ever taken. He states that his walking and balance has improved. He was able to drive his motorcycle to the office today. He has an appointment with the hand surgeon at the end of the month. The patient has decreased his medication. He states that he is only taking one tablet of lyrica a day and one tablet of Tizanidine daily. Overall the patient states that he has improved " a lot" since the last visit.   HISTORY 04/02/14 (WILLIS):Matthew [name] is a 65 year old right-handed white male with a history of cervical spondylosis, and chronic cervical spine and shoulder discomfort. The patient has degenerative arthritis that is causing increasing discomfort for him involving the knees and hands. The patient indicates that weather changes will result in increased discomfort with the arthritis. The patient has bilateral carpal tunnel syndrome that was mild on the study done in December 2014. The patient currently reports increasing discomfort in the right fifth finger with severe  numbness that is not responding to the use of wrist splints. The patient has severe limitation of range movement of the cervical spine. He is followed by a physical therapist, working on balance issues. The patient has paresthesias in legs, and he has evidence of myelomalacia in the cervical spine. He returns this office for evaluation. He is on tizanidine, Robaxin, and Lyrica with some benefit. A TENS unit trial did result in some benefit, and the  patient will be getting a TENS unit on a permanent basis. The patient will use a cane for ambulation.  REVIEW OF SYSTEMS: Out of a complete 14 system review of symptoms, the patient complains only of the following symptoms, and all other reviewed systems are negative.  Walking difficulty, restless leg  ALLERGIES: Allergies  Allergen Reactions  . Penicillins   . Codeine     HOME MEDICATIONS: Outpatient Prescriptions Prior to Visit  Medication Sig Dispense Refill  . diazepam (VALIUM) 5 MG tablet TAKE 1 TABLET BY MOUTH AT BEDTIME AS NEEDED FOR MUSCLE SPASMS 90 tablet 0  . ibuprofen (ADVIL,MOTRIN) 600 MG tablet TAKE 1 TABLET BY MOUTH EVERY 8 HOURS AS NEEDED 270 tablet 0  . LYRICA 100 MG capsule TAKE ONE CAPSULE BY MOUTH DAILY 90 capsule 0   No facility-administered medications prior to visit.    PAST MEDICAL HISTORY: Past Medical History  Diagnosis Date  . Anxiety disorder   . Hypertension   . Joint pain, knee   . Pain in joint, shoulder region   . Post traumatic stress disorder   . Spinal stenosis in cervical region   . Spondylosis   . Stroke (HCC)   . Cervical spondylosis without myelopathy 05/07/2013  . Gait disorder 05/07/2013  . Foot fracture     right foot  . Carpal tunnel syndrome     Bilateral  . Restless leg syndrome     PAST SURGICAL HISTORY: Past Surgical History  Procedure Laterality Date  . Knee surgery  1980  . Neck surgery      2009 or 2010    FAMILY HISTORY: Family History  Problem Relation Age of Onset  . Hypertension Other   . Stroke Other   . Diabetes Other   . Cancer Brother 60    brain tumor  . Cancer - Lung Mother   . Diabetes Father     SOCIAL HISTORY: Social History   Social History  . Marital Status: Divorced    Spouse Name: N/A  . Number of Children: 3  . Years of Education: college   Occupational History  .      retired   Social History Main Topics  . Smoking status: Former Games developer  . Smokeless tobacco: Never Used      Comment: quit 8 years  ago  . Alcohol Use: No     Comment: quit drinking 8 years ago  . Drug Use: No  . Sexual Activity: Not on file   Other Topics Concern  . Not on file   Social History Narrative   Patient is right handed, consumes one cup of caffeine daily, resides in home with roommate.      PHYSICAL EXAM  Filed Vitals:   11/13/15 1342  BP: 134/73  Pulse: 62  Height: 6' (1.829 m)  Weight: 165 lb 3.2 oz (74.934 kg)   Body mass index is 22.4 kg/(m^2).  Generalized: Well developed, in no acute distress   Neurological examination  Mentation: Alert oriented to time, place, history taking. Follows all  commands speech and language fluent Cranial nerve II-XII: Pupils were equal round reactive to light. Extraocular movements were full, visual field were full on confrontational test. Facial sensation and strength were normal. Uvula tongue midline. Head turning and shoulder shrug  were normal and symmetric. Motor: The motor testing reveals 5 over 5 strength of all 4 extremities. Good symmetric motor tone is noted throughout.  Sensory: Sensory testing is intact to soft touch on all 4 extremities. No evidence of extinction is noted.  Coordination: Cerebellar testing reveals good finger-nose-finger and heel-to-shin bilaterally.  Gait and station: Gait is normal. Tandem gait is normal. Romberg is negative. No drift is seen.  Reflexes: Deep tendon reflexes are symmetric and normal bilaterally.   DIAGNOSTIC DATA (LABS, IMAGING, TESTING) - I reviewed patient records, labs, notes, testing and imaging myself where available.      ASSESSMENT AND PLAN 65 y.o. year old male  has a past medical history of Anxiety disorder; Hypertension; Joint pain, knee; Pain in joint, shoulder region; Post traumatic stress disorder; Spinal stenosis in cervical region; Spondylosis; Stroke (HCC); Cervical spondylosis without myelopathy (05/07/2013); Gait disorder (05/07/2013); Foot fracture; Carpal tunnel  syndrome; and Restless leg syndrome. here with:  1. Cervical spondylosis 2. Paresthesias in the feet 3. Muscle cramps  Overall the patient is doing well. He will continue on Valium and Lyrica. Patient advised that if his symptoms worsen or he develops any new symptoms he will let us know. He will follow-up in 6 months or sooner if needed.     Butch PennyMegan Aiken Withem, MSN, NP-C 11/13/2015, 1:37 PM [facility] [address], [address] [place], [place] [address] [phone]

## 2015-11-13 NOTE — Patient Instructions (Signed)
Continue Lyrica and valium  If your symptoms worsen or you develop new symptoms please let us know.

## 2015-11-13 NOTE — Progress Notes (Signed)
I have read the note, and I agree with the clinical assessment and plan.  Naman Spychalski KEITH   

## 2015-11-25 ENCOUNTER — Telehealth: Payer: Self-pay | Admitting: Adult Health

## 2015-11-25 NOTE — Telephone Encounter (Signed)
I called the patient. He states that he went to see Dr. Deborha PaymentJason Zigler DPM. They did a skin biopsy and according to the patient was told he has clinical neuropathy. They recommended epidural steroid injections to "cure his foot problems." I advised the patient that he should have Dr. Caesar BookmanZieglar forward his office note and results of biopsy to our office. Patient verbalized understanding and will drop them off tomorrow.

## 2015-11-25 NOTE — Telephone Encounter (Signed)
Pt called said he saw Dr Maeola SarahJason Zieglar and was advised he needed "steroid injections in his spine to cure the foot problem". He said a "biopsy was performed and he has clinical neuropathy". Pt is requesting this to be done asap as he is in pain. Please call

## 2015-11-26 ENCOUNTER — Telehealth: Payer: Self-pay | Admitting: *Deleted

## 2015-11-27 NOTE — Telephone Encounter (Signed)
I called patient. The skin biopsy revealed no evidence of a peripheral neuropathy. The tingling and numbness in the feet is likely related to a cervical myelopathy. The patient has tried tizanidine, Cymbalta, Alupent, he is now on Lyrica without complete control of his symptoms. We will get a revisit set up, we may try the medication such as Trileptal or carbamazepine for the discomfort.

## 2015-11-28 NOTE — Telephone Encounter (Signed)
Spoke to pt and appt was scheduled for f/u in July. He agreed to arrive 15-30 min prior to his appt time.

## 2015-12-11 ENCOUNTER — Other Ambulatory Visit: Payer: Self-pay | Admitting: Neurology

## 2015-12-11 ENCOUNTER — Ambulatory Visit (INDEPENDENT_AMBULATORY_CARE_PROVIDER_SITE_OTHER): Payer: Medicare Other | Admitting: Neurology

## 2015-12-11 ENCOUNTER — Encounter: Payer: Self-pay | Admitting: Neurology

## 2015-12-11 VITALS — BP 134/76 | HR 58 | Ht 72.0 in | Wt 165.0 lb

## 2015-12-11 DIAGNOSIS — R269 Unspecified abnormalities of gait and mobility: Secondary | ICD-10-CM

## 2015-12-11 DIAGNOSIS — E538 Deficiency of other specified B group vitamins: Secondary | ICD-10-CM | POA: Diagnosis not present

## 2015-12-11 DIAGNOSIS — R202 Paresthesia of skin: Secondary | ICD-10-CM

## 2015-12-11 DIAGNOSIS — M47812 Spondylosis without myelopathy or radiculopathy, cervical region: Secondary | ICD-10-CM

## 2015-12-11 MED ORDER — CARBAMAZEPINE 200 MG PO TABS: 100.0000 mg | ORAL_TABLET | Freq: Two times a day (BID) | ORAL | Status: DC

## 2015-12-11 NOTE — Progress Notes (Signed)
Reason for visit: Leg pain  Matthew [name] is an 65 y.o. male  History of present illness:  Matthew [name] is a 65 year old right-handed white male with a history of cervical spondylosis. The patient has an associated cervical myelopathy. He has some chronic pain issues associated with this. Within the last 3-4 months, he has had increasing discomfort in the feet, he has a cold sensation that is quite uncomfortable, he is unable to wear socks due to pain with light touch to the skin of the feet. He has been seen by a podiatrist, a skin biopsy was done and did not confirm the presence of a peripheral neuropathy. Nerve conduction studies done in our office also did not show evidence of a peripheral neuropathy. He denies any significant low back pain or pain down the legs. He does have restless leg syndrome associated with the discomfort. He has noted a mild change in his balance. He is on Lyrica without complete benefit. He returns to this office for an evaluation.  Past Medical History  Diagnosis Date  . Anxiety disorder   . Hypertension   . Joint pain, knee   . Pain in joint, shoulder region   . Post traumatic stress disorder   . Spinal stenosis in cervical region   . Spondylosis   . Stroke (HCC)   . Cervical spondylosis without myelopathy 05/07/2013  . Gait disorder 05/07/2013  . Foot fracture     right foot  . Carpal tunnel syndrome     Bilateral  . Restless leg syndrome     Past Surgical History  Procedure Laterality Date  . Knee surgery  1980  . Neck surgery      2009 or 2010    Family History  Problem Relation Age of Onset  . Hypertension Other   . Stroke Other   . Diabetes Other   . Cancer Brother 60    brain tumor  . Cancer - Lung Mother   . Diabetes Father     Social history:  reports that he has quit smoking. He has never used smokeless tobacco. He reports that he does not drink alcohol or use illicit drugs.    Allergies  Allergen Reactions  . Penicillins    . Codeine     Medications:  Prior to Admission medications   Medication Sig Start Date End Date Taking? Authorizing Provider  ASPIRIN LOW DOSE 81 MG EC tablet  11/06/15   Historical Provider, MD  cetirizine (ZYRTEC) 10 MG tablet  11/06/15   Historical Provider, MD  diazepam (VALIUM) 5 MG tablet TAKE 1 TABLET BY MOUTH AT BEDTIME AS NEEDED FOR MUSCLE SPASMS 10/21/15   York Spanielharles K Namira Rosekrans, MD  fluticasone [facility](FLONASE) 50 MCG/ACT nasal spray  10/10/15   Historical Provider, MD  ibuprofen (ADVIL,MOTRIN) 600 MG tablet TAKE 1 TABLET BY MOUTH EVERY 8 HOURS AS NEEDED    York Spanielharles K Georgianne Gritz, MD  LYRICA 100 MG capsule TAKE ONE CAPSULE BY MOUTH DAILY 10/21/15   York Spanielharles K Toniya Rozar, MD    ROS:  Out of a complete 14 system review of symptoms, the patient complains only of the following symptoms, and all other reviewed systems are negative.  Restless legs Achy muscles, muscle cramps, walking difficulty  Blood pressure 134/76, pulse 58, height 6' (1.829 m), weight 165 lb (74.844 kg).  Physical Exam  General: The patient is alert and cooperative at the time of the examination.  Skin: No significant peripheral edema is noted.   Neurologic Exam  Mental status: The patient is alert and oriented x 3 at the time of the examination. The patient has apparent normal recent and remote memory, with an apparently normal attention span and concentration ability.   Cranial nerves: Facial symmetry is present. Speech is normal, no aphasia or dysarthria is noted. Extraocular movements are full. Visual fields are full.  Motor: The patient has good strength in all 4 extremities.  Sensory examination: Soft touch sensation is symmetric on the face, arms, and legs.  Coordination: The patient has good finger-nose-finger and heel-to-shin bilaterally.  Gait and station: The patient has a normal gait. Tandem gait is minimally unsteady. Romberg is negative. No drift is seen.  Reflexes: Deep tendon reflexes are symmetric, with  exception that the left biceps reflex is greater than the right.   Assessment/Plan:  1. Cervical spondylosis with cervical myelopathy  2. Bilateral foot discomfort  The patient is having dysesthetic pain in the feet, this discomfort has significantly worsened over the last 3-4 months. We will add carbamazepine taking 100 mg twice daily. The patient will be set up for MRI of the lumbar spine, and blood work will be done today. He will follow-up in about 3 months, sooner if needed.  [name]. Keith Sadey Yandell MD 12/11/2015 6:55 PM  [facility] [address] [place], [place] [address]  Phone [phone] Fax [phone]

## 2015-12-13 LAB — VITAMIN B12: Vitamin B-12: 284 pg/mL (ref 211–946)

## 2015-12-13 LAB — ANA W/REFLEX: Anti Nuclear Antibody(ANA): NEGATIVE

## 2015-12-13 LAB — B. BURGDORFI ANTIBODIES: Lyme IgG/IgM Ab: <0.91 {ISR} (ref 0.00–0.90)

## 2015-12-13 LAB — COPPER, SERUM: Copper: 92 ug/dL (ref 72–166)

## 2015-12-15 ENCOUNTER — Telehealth: Payer: Self-pay

## 2015-12-15 NOTE — Telephone Encounter (Signed)
-----   Message from York Spanielharles K Willis, MD sent at 12/13/2015 11:54 AM EDT -----  The blood work results are unremarkable. Please call the patient.   ----- Message -----    From: [facility] Lab Results In Interface    Sent: 12/12/2015   7:42 AM      To: York Spanielharles K Willis, MD

## 2015-12-15 NOTE — Telephone Encounter (Signed)
Called pt w/ unremarkable lab results. Verbalized understanding and appreciation for call. 

## 2015-12-25 ENCOUNTER — Ambulatory Visit
Admission: RE | Admit: 2015-12-25 | Discharge: 2015-12-25 | Disposition: A | Discharge status: Home / Self Care | Payer: Medicare Other | Source: Ambulatory Visit | Attending: Neurology | Admitting: Neurology

## 2015-12-25 DIAGNOSIS — R269 Unspecified abnormalities of gait and mobility: Secondary | ICD-10-CM | POA: Diagnosis not present

## 2015-12-25 DIAGNOSIS — R202 Paresthesia of skin: Secondary | ICD-10-CM

## 2015-12-26 ENCOUNTER — Telehealth: Payer: Self-pay | Admitting: Neurology

## 2015-12-26 NOTE — Telephone Encounter (Signed)
I called patient. The MRI of the low back does not show severe spinal stenosis. There could be some involvement of the right S1 nerve root but this does not explain bilateral symptoms in the legs. If the symptoms continued to worsen, particularly if there is an alteration in his ability to ambulate, reevaluation of the cervical spine should be done.    MRI lumbar 12/25/15:  IMPRESSION: This MRI of the lumbar spine without contrast shows the following: 1. Right paramedian disc herniation at L5-S1 and endplate spurring causing moderately severe right lateral recess stenosis that slightly displaces the right S1 nerve root posteriorly without definitely compressing it. The lateral recess on the left is not significantly narrowed. The neural foramina are only mildly narrowed. 2. There are mild degenerative changes at L3-L4 and L4-L5 that do not lead to nerve root compression. 3. There are no acute findings.

## 2016-01-22 ENCOUNTER — Other Ambulatory Visit: Payer: Self-pay | Admitting: Neurology

## 2016-01-23 NOTE — Telephone Encounter (Signed)
Dr Anne HahnWillis- are you okay with refilling? I do not see it mentioned in your last office note.

## 2016-01-23 NOTE — Telephone Encounter (Signed)
Rx printed, signed, faxed to pharmacy. 

## 2016-02-08 ENCOUNTER — Other Ambulatory Visit: Payer: Self-pay | Admitting: Neurology

## 2016-02-12 ENCOUNTER — Other Ambulatory Visit: Payer: Self-pay | Admitting: Neurology

## 2016-03-31 ENCOUNTER — Ambulatory Visit (INDEPENDENT_AMBULATORY_CARE_PROVIDER_SITE_OTHER): Payer: Medicare Other | Admitting: Adult Health

## 2016-03-31 ENCOUNTER — Encounter: Payer: Self-pay | Admitting: Adult Health

## 2016-03-31 VITALS — BP 150/82 | HR 72 | Resp 18 | Ht 72.0 in | Wt 166.5 lb

## 2016-03-31 DIAGNOSIS — M47812 Spondylosis without myelopathy or radiculopathy, cervical region: Secondary | ICD-10-CM | POA: Diagnosis not present

## 2016-03-31 DIAGNOSIS — R269 Unspecified abnormalities of gait and mobility: Secondary | ICD-10-CM | POA: Diagnosis not present

## 2016-03-31 NOTE — Patient Instructions (Signed)
Continue Lyrica.  If your symptoms worsen or you develop new symptoms please let us know.   

## 2016-03-31 NOTE — Progress Notes (Signed)
I have read the note, and I agree with the clinical assessment and plan.  Jasmine Maceachern KEITH   

## 2016-03-31 NOTE — Progress Notes (Signed)
PATIENT: Matthew [name] DOB: [birth year]  REASON FOR VISIT: follow up- cervical spondylosis HISTORY FROM: patient  HISTORY OF PRESENT ILLNESS: Matthew [name] is a 65 year old male with a history of cervical spondylosis. He returns today for follow-up. The patient states today that he followed up with his primary care who started him on Trental and amlodipine. He states that ever since he started this medication his leg discomfort has gotten better. He still has restless leg symptoms but no discomfort. He also feels that his gait has improved since starting this medication. He continues to take Lyrica and Valium with good benefit. He still has cold intolerance in the lower extremities but he does feel that he tolerates this better since starting these medications. He returns today for an evaluation.  HISTORY 12/11/15: Matthew [name] is a 65 year old right-handed white male with a history of cervical spondylosis. The patient has an associated cervical myelopathy. He has some chronic pain issues associated with this. Within the last 3-4 months, he has had increasing discomfort in the feet, he has a cold sensation that is quite uncomfortable, he is unable to wear socks due to pain with light touch to the skin of the feet. He has been seen by a podiatrist, a skin biopsy was done and did not confirm the presence of a peripheral neuropathy. Nerve conduction studies done in our office also did not show evidence of a peripheral neuropathy. He denies any significant low back pain or pain down the legs. He does have restless leg syndrome associated with the discomfort. He has noted a mild change in his balance. He is on Lyrica without complete benefit. He returns to this office for an evaluation.  REVIEW OF SYSTEMS: Out of a complete 14 system review of symptoms, the patient complains only of the following symptoms, and all other reviewed systems are negative.  Joint pain, muscle cramps, walking difficulty, neck  stiffness, restless leg, leg swelling  ALLERGIES: Allergies  Allergen Reactions  . Penicillins   . Codeine     HOME MEDICATIONS: Outpatient Medications Prior to Visit  Medication Sig Dispense Refill  . ASPIRIN LOW DOSE 81 MG EC tablet   11  . cetirizine (ZYRTEC) 10 MG tablet   11  . diazepam (VALIUM) 5 MG tablet TAKE 1 TABLET BY MOUTH AT BEDTIME AS NEEDED FOR MUSCLE SPASMS 90 tablet 1  . LYRICA 100 MG capsule TAKE ONE CAPSULE BY MOUTH DAILY 90 capsule 0  . carbamazepine (TEGRETOL) 200 MG tablet TAKE 1/2 TABLET(100 MG) BY MOUTH TWICE DAILY (Patient not taking: Reported on 03/31/2016) 90 tablet 0  . fluticasone (FLONASE) 50 MCG/ACT nasal spray   0  . ibuprofen (ADVIL,MOTRIN) 600 MG tablet TAKE 1 TABLET BY MOUTH EVERY 8 HOURS AS NEEDED (Patient not taking: Reported on 03/31/2016) 270 tablet 0   No facility-administered medications prior to visit.     PAST MEDICAL HISTORY: Past Medical History:  Diagnosis Date  . Anxiety disorder   . Carpal tunnel syndrome    Bilateral  . Cervical spondylosis without myelopathy 05/07/2013  . Foot fracture    right foot  . Gait disorder 05/07/2013  . Hypertension   . Joint pain, knee   . Pain in joint, shoulder region   . Post traumatic stress disorder   . Restless leg syndrome   . Spinal stenosis in cervical region   . Spondylosis   . Stroke [facility])     PAST SURGICAL HISTORY: Past Surgical History:  Procedure Laterality Date  .  KNEE SURGERY  1980  . NECK SURGERY     2009 or 2010    FAMILY HISTORY: Family History  Problem Relation Age of Onset  . Cancer Brother 60    brain tumor  . Cancer - Lung Mother   . Diabetes Father   . Hypertension Other   . Stroke Other   . Diabetes Other     SOCIAL HISTORY: Social History   Social History  . Marital status: Divorced    Spouse name: N/A  . Number of children: 3  . Years of education: college   Occupational History  .  Unemployed    retired   Social History Main Topics  .  Smoking status: Former Games developermoker  . Smokeless tobacco: Never Used     Comment: quit 8 years  ago  . Alcohol use No     Comment: quit drinking 8 years ago  . Drug use: No  . Sexual activity: Not on file   Other Topics Concern  . Not on file   Social History Narrative   Patient is right handed, consumes one cup of caffeine daily, resides in home with roommate.      PHYSICAL EXAM  Vitals:   03/31/16 1131  BP: (!) 150/82  Pulse: 72  Resp: 18  Weight: 166 lb 8 oz (75.5 kg)  Height: 6' (1.829 m)   Body mass index is 22.58 kg/m.  Generalized: Well developed, in no acute distress   Neurological examination  Mentation: Alert oriented to time, place, history taking. Follows all commands speech and language fluent Cranial nerve II-XII: Pupils were equal round reactive to light. Extraocular movements were full, visual field were full on confrontational test. Facial sensation and strength were normal. Uvula tongue midline. Head turning and shoulder shrug  were normal and symmetric. Motor: The motor testing reveals 5 over 5 strength of all 4 extremities. Good symmetric motor tone is noted throughout.  Sensory: Sensory testing is intact to soft touch on all 4 extremities. No evidence of extinction is noted.  Coordination: Cerebellar testing reveals good finger-nose-finger and heel-to-shin bilaterally.  Gait and station: Gait is normal. Tandem gait is normal. Romberg is negative. No drift is seen.  Reflexes: Deep tendon reflexes are symmetric and normal bilaterally.   DIAGNOSTIC DATA (LABS, IMAGING, TESTING) - I reviewed patient records, labs, notes, testing and imaging myself where available.     ASSESSMENT AND PLAN 65 y.o. year old male  has a past medical history of Anxiety disorder; Carpal tunnel syndrome; Cervical spondylosis without myelopathy (05/07/2013); Foot fracture; Gait disorder (05/07/2013); Hypertension; Joint pain, knee; Pain in joint, shoulder region; Post traumatic stress  disorder; Restless leg syndrome; Spinal stenosis in cervical region; Spondylosis; and Stroke (HCC). here with:  1. Cervical spondylosis 2. Gait disorder  Overall the patient seems to have improved after his primary care started him onTrental and amlodipine. He is encouraged to continue these medications and make his PCP aware of the benefit. For now we will not make any adjustments to his medication. He will continue on Lyrica and Valium. If his symptoms worsen or he develops any new symptoms he should let us know. He will follow-up in 6 months with Dr. Anne HahnWillis or sooner if needed.   Butch PennyMegan Anabela Crayton, MSN, NP-C 03/31/2016, 12:07 PM [facility] [address], [address] [place], [place] [address] [phone]

## 2016-05-10 ENCOUNTER — Other Ambulatory Visit: Payer: Self-pay | Admitting: Neurology

## 2016-05-17 ENCOUNTER — Ambulatory Visit: Payer: Medicare Other | Admitting: Adult Health

## 2016-07-18 ENCOUNTER — Other Ambulatory Visit: Payer: Self-pay | Admitting: Neurology

## 2016-07-19 NOTE — Telephone Encounter (Signed)
Faxed printed/signed rx diazepam 5mg  tablet to pt pharmacy. Fax: [phone]: [phone]. Received confirmation.

## 2016-09-30 ENCOUNTER — Ambulatory Visit (INDEPENDENT_AMBULATORY_CARE_PROVIDER_SITE_OTHER): Payer: Medicare Other | Admitting: Neurology

## 2016-09-30 ENCOUNTER — Encounter (INDEPENDENT_AMBULATORY_CARE_PROVIDER_SITE_OTHER): Payer: Self-pay

## 2016-09-30 ENCOUNTER — Encounter: Payer: Self-pay | Admitting: Neurology

## 2016-09-30 VITALS — BP 146/67 | HR 59 | Ht 72.0 in | Wt 177.0 lb

## 2016-09-30 DIAGNOSIS — R269 Unspecified abnormalities of gait and mobility: Secondary | ICD-10-CM | POA: Diagnosis not present

## 2016-09-30 DIAGNOSIS — M47812 Spondylosis without myelopathy or radiculopathy, cervical region: Secondary | ICD-10-CM

## 2016-09-30 NOTE — Progress Notes (Signed)
Reason for visit: Neck discomfort  Matthew [name] is an 66 y.o. male  History of present illness:  Matthew [name] is a 66 year old right-handed white male with a history of chronic neck and shoulder discomfort. The patient is doing relatively well currently with Lyrica and diazepam. The patient still has some stiffness of the neck, this will come and go. Overall he is doing much better than he was 6 months ago. The patient has had some cold sensations in the feet that has partially improved with use of Trental. The patient denies any significant gait instability issues, he has not had any falls. He returns to the office today for an evaluation.  Past Medical History:  Diagnosis Date  . Anxiety disorder   . Carpal tunnel syndrome    Bilateral  . Cervical spondylosis without myelopathy 05/07/2013  . Foot fracture    right foot  . Gait disorder 05/07/2013  . Hypertension   . Joint pain, knee   . Pain in joint, shoulder region   . Post traumatic stress disorder   . Restless leg syndrome   . Spinal stenosis in cervical region   . Spondylosis   . Stroke [facility])     Past Surgical History:  Procedure Laterality Date  . KNEE SURGERY  1980  . NECK SURGERY     2009 or 2010    Family History  Problem Relation Age of Onset  . Cancer Brother 60    brain tumor  . Cancer - Lung Mother   . Diabetes Father   . Hypertension Other   . Stroke Other   . Diabetes Other     Social history:  reports that he has quit smoking. He has never used smokeless tobacco. He reports that he does not drink alcohol or use drugs.    Allergies  Allergen Reactions  . Penicillins   . Codeine     Medications:  Prior to Admission medications   Medication Sig Start Date End Date Taking? Authorizing Provider  amLODipine (NORVASC) 5 MG tablet  02/23/16  Yes Historical Provider, MD  ASPIRIN LOW DOSE 81 MG EC tablet  11/06/15  Yes Historical Provider, MD  carbamazepine (TEGRETOL) 200 MG tablet TAKE 1/2  TABLET(100 MG) BY MOUTH TWICE DAILY 12/11/15  Yes York Spaniel, MD  cetirizine (ZYRTEC) 10 MG tablet  11/06/15  Yes Historical Provider, MD  diazepam (VALIUM) 5 MG tablet TAKE 1 TABLET BY MOUTH AT BEDTIME AS NEEDED FOR MUSCLE SPASMS 07/19/16  Yes York Spaniel, MD  fluticasone [facility]) 50 MCG/ACT nasal spray  10/10/15  Yes Historical Provider, MD  ibuprofen (ADVIL,MOTRIN) 600 MG tablet TAKE 1 TABLET BY MOUTH EVERY 8 HOURS AS NEEDED   Yes York Spaniel, MD  LYRICA 100 MG capsule TAKE ONE CAPSULE BY MOUTH DAILY 05/11/16  Yes York Spaniel, MD  pentoxifylline (TRENTAL) 400 MG CR tablet  02/24/16  Yes Historical Provider, MD    ROS:  Out of a complete 14 system review of symptoms, the patient complains only of the following symptoms, and all other reviewed systems are negative.  Restless legs Achy muscles, muscle cramps, walking difficulty, neck stiffness  Blood pressure (!) 146/67, pulse (!) 59, height 6' (1.829 m), weight 177 lb (80.3 kg).  Physical Exam  General: The patient is alert and cooperative at the time of the examination.  Skin: No significant peripheral edema is noted.   Neurologic Exam  Mental status: The patient is alert and oriented x 3  at the time of the examination. The patient has apparent normal recent and remote memory, with an apparently normal attention span and concentration ability.   Cranial nerves: Facial symmetry is present. Speech is normal, no aphasia or dysarthria is noted. Extraocular movements are full. Visual fields are full.  Motor: The patient has good strength in all 4 extremities.  Sensory examination: Soft touch sensation is symmetric on the face, arms, and legs.  Coordination: The patient has good finger-nose-finger and heel-to-shin bilaterally.  Gait and station: The patient has a normal gait. Tandem gait is normal. Romberg is negative. No drift is seen.  Reflexes: Deep tendon reflexes are symmetric.    Assessment/Plan:  1.  Cervical spondylosis  The patient is doing relatively well at this time. We will continue the Lyrica and diazepam, he is off carbamazepine. The patient will follow-up in about 8 months, sooner if needed.  [name] MD 09/30/2016 1:32 PM  [facility] [address] [place], [place] [address]  Phone [phone] Fax [phone]

## 2016-11-15 ENCOUNTER — Other Ambulatory Visit: Payer: Self-pay | Admitting: Neurology

## 2016-11-16 NOTE — Telephone Encounter (Signed)
Faxed printed/signed rx lyrica to pt pharmacy. Fax: [phone].  Received confirmation.

## 2017-01-11 ENCOUNTER — Other Ambulatory Visit: Payer: Self-pay | Admitting: Neurology

## 2017-01-11 NOTE — Telephone Encounter (Signed)
Faxed printed/signed rx diazepam to pt pharmacy. Fax: [phone]. Received confirmation.

## 2017-05-07 ENCOUNTER — Other Ambulatory Visit: Payer: Self-pay | Admitting: Neurology

## 2017-05-09 NOTE — Telephone Encounter (Signed)
[facility] [address] [facility] [phone]. Received fax confirmation.

## 2017-06-13 ENCOUNTER — Ambulatory Visit: Payer: Medicare Other | Admitting: Adult Health

## 2017-07-11 ENCOUNTER — Other Ambulatory Visit: Payer: Self-pay | Admitting: Neurology

## 2017-07-11 NOTE — Telephone Encounter (Signed)
[facility] [address] [facility] [phone]. Received fax confirmation.

## 2017-07-11 NOTE — Telephone Encounter (Signed)
I will give him another refill, but the patient will need to schedule a revisit.

## 2017-07-11 NOTE — Telephone Encounter (Signed)
Called and spoke with patient. Advised he needs to follow up at least yearly to continue receiving refills on medications. Made appt for 10/26/17 with MM,NP at 8am.   He will call back when it gets closer to see if any appt available later in the day. This was early for him but agreed to take appt.  Advised rx diazepam refill sent to pharmacy. He verbalized understanding and appreciation for call.

## 2017-09-14 ENCOUNTER — Telehealth: Payer: Self-pay | Admitting: *Deleted

## 2017-09-14 MED ORDER — DIAZEPAM 5 MG PO TABS: ORAL_TABLET | ORAL | 1 refills | Status: DC

## 2017-09-14 NOTE — Telephone Encounter (Signed)
Patient was given a prescription for 90 diazepam tablets in February, should have 0 more refill, but for some reason the patient has been getting only 30 tablets at a time.  I will send another prescription in.

## 2017-09-14 NOTE — Telephone Encounter (Signed)
[facility] [facility] [address] and spoke with [place]. Pt last refilled Lyrica 100mg  capsule qty 90 on 08/06/17. Last refilled diazepam 5mg  tab on 09/11/17 qty 30.   [facility] meds. [facility] rx Lyrica too early to refill. Pt received 90 days supply 08/06/17.   Will send request to CW,MD for refill on diazepam.

## 2017-09-28 ENCOUNTER — Telehealth: Payer: Self-pay | Admitting: Neurology

## 2017-09-28 MED ORDER — PREGABALIN 100 MG PO CAPS: 100.0000 mg | ORAL_CAPSULE | Freq: Every day | ORAL | 1 refills | Status: DC

## 2017-09-28 NOTE — Telephone Encounter (Signed)
Patient requesting new Rx for LYRICA 100 MG capsule [facility].

## 2017-09-28 NOTE — Addendum Note (Signed)
Addended by: York SpanielWILLIS, CHARLES K on: 09/28/2017 01:15 PM   Modules accepted: Orders

## 2017-09-28 NOTE — Telephone Encounter (Signed)
[facility].

## 2017-09-28 NOTE — Telephone Encounter (Signed)
I called [phone] - [place], [place] - [id number] [facility] [address]  Patient last filled Lyrica 100mg  capsule 08/06/17 qty 90 (90 days supply).

## 2017-10-26 ENCOUNTER — Ambulatory Visit: Payer: Self-pay | Admitting: Adult Health

## 2017-11-02 ENCOUNTER — Encounter: Payer: Self-pay | Admitting: Adult Health

## 2017-11-02 ENCOUNTER — Ambulatory Visit (INDEPENDENT_AMBULATORY_CARE_PROVIDER_SITE_OTHER): Payer: Medicare Other | Admitting: Adult Health

## 2017-11-02 VITALS — BP 165/68 | HR 68 | Ht 72.0 in | Wt 167.2 lb

## 2017-11-02 DIAGNOSIS — M25551 Pain in right hip: Secondary | ICD-10-CM

## 2017-11-02 DIAGNOSIS — M47812 Spondylosis without myelopathy or radiculopathy, cervical region: Secondary | ICD-10-CM | POA: Diagnosis not present

## 2017-11-02 DIAGNOSIS — M25552 Pain in left hip: Secondary | ICD-10-CM | POA: Diagnosis not present

## 2017-11-02 NOTE — Progress Notes (Signed)
PATIENT: Matthew [name] DOB: [birth year]  REASON FOR VISIT: follow up HISTORY FROM: patient  HISTORY OF PRESENT ILLNESS: Today 11/02/17 Matthew [name] is a 67 year old male with a history of chronic neck and shoulder pain.  He returns today for follow-up.  He remains on Lyrica and diazepam.  He reports that this works well for his neck and shoulder pain.  He reports that he been having shooting pain starting at the hip down the lateral aspect of the leg stopping midway before the knee.  He states it predominantly affects the right side but he has some pain on the left. he states that it typically occurs when he is ambulating.  In the past he has complained of similar discomfort and a MRI of the lumbar spine was completed that did not explain his symptoms.  He also had a nerve conduction study that did not show a neuropathy.  The patient has never had his right hip evaluated.  The patient is also complaining of swelling in the feet as well as a warm sensation.  In the past he was complaining of cold feet and was seen by vein and vascular placed on trental.  He returns today for an evaluation.   HISTORY Matthew [name] is a 67 year old right-handed white male with a history of chronic neck and shoulder discomfort. The patient is doing relatively well currently with Lyrica and diazepam. The patient still has some stiffness of the neck, this will come and go. Overall he is doing much better than he was 6 months ago. The patient has had some cold sensations in the feet that has partially improved with use of Trental. The patient denies any significant gait instability issues, he has not had any falls. He returns to the office today for an evaluation.    REVIEW OF SYSTEMS: Out of a complete 14 system review of symptoms, the patient complains only of the following symptoms, and all other reviewed systems are negative.  Leg swelling, muscle cramps, aching muscles, walking difficulty  ALLERGIES: Allergies   Allergen Reactions  . Penicillins   . Codeine     HOME MEDICATIONS: Outpatient Medications Prior to Visit  Medication Sig Dispense Refill  . amLODipine (NORVASC) 5 MG tablet     . ASPIRIN LOW DOSE 81 MG EC tablet   11  . diazepam (VALIUM) 5 MG tablet TAKE 1 TABLET BY MOUTH EVERY NIGHT AT BEDTIME AS NEEDED FOR MUSCLE SPASMS 90 tablet 1  . fluticasone (FLONASE) 50 MCG/ACT nasal spray   0  . ibuprofen (ADVIL,MOTRIN) 600 MG tablet TAKE 1 TABLET BY MOUTH EVERY 8 HOURS AS NEEDED 270 tablet 0  . pentoxifylline (TRENTAL) 400 MG CR tablet     . pregabalin (LYRICA) 100 MG capsule Take 1 capsule (100 mg total) by mouth daily. 90 capsule 1  . cetirizine (ZYRTEC) 10 MG tablet   11   No facility-administered medications prior to visit.     PAST MEDICAL HISTORY: Past Medical History:  Diagnosis Date  . Anxiety disorder   . Carpal tunnel syndrome    Bilateral  . Cervical spondylosis without myelopathy 05/07/2013  . Foot fracture    right foot  . Gait disorder 05/07/2013  . Hypertension   . Joint pain, knee   . Pain in joint, shoulder region   . Post traumatic stress disorder   . Restless leg syndrome   . Spinal stenosis in cervical region   . Spondylosis   . Stroke [facility])  PAST SURGICAL HISTORY: Past Surgical History:  Procedure Laterality Date  . KNEE SURGERY  1980  . NECK SURGERY     2009 or 2010    FAMILY HISTORY: Family History  Problem Relation Age of Onset  . Cancer Brother 60       brain tumor  . Cancer - Lung Mother   . Diabetes Father   . Hypertension Other   . Stroke Other   . Diabetes Other     SOCIAL HISTORY: Social History   Socioeconomic History  . Marital status: Divorced    Spouse name: Not on file  . Number of children: 3  . Years of education: college  . Highest education level: Not on file  Occupational History    Employer: UNEMPLOYED    Comment: retired  Engineer, production  . Financial resource strain: Not on file  . Food insecurity:     Worry: Not on file    Inability: Not on file  . Transportation needs:    Medical: Not on file    Non-medical: Not on file  Tobacco Use  . Smoking status: Former Games developer  . Smokeless tobacco: Never Used  . Tobacco comment: quit 8 years  ago  Substance and Sexual Activity  . Alcohol use: No    Alcohol/week: 0.0 oz    Comment: quit drinking 8 years ago  . Drug use: No  . Sexual activity: Not on file  Lifestyle  . Physical activity:    Days per week: Not on file    Minutes per session: Not on file  . Stress: Not on file  Relationships  . Social connections:    Talks on phone: Not on file    Gets together: Not on file    Attends religious service: Not on file    Active member of club or organization: Not on file    Attends meetings of clubs or organizations: Not on file    Relationship status: Not on file  . Intimate partner violence:    Fear of current or ex partner: Not on file    Emotionally abused: Not on file    Physically abused: Not on file    Forced sexual activity: Not on file  Other Topics Concern  . Not on file  Social History Narrative   Patient is right handed, consumes one cup of caffeine daily, resides in home with roommate.      PHYSICAL EXAM  Vitals:   11/02/17 1334  BP: (!) 165/68  Pulse: 68  Weight: 167 lb 3.2 oz (75.8 kg)  Height: 6' (1.829 m)   Body mass index is 22.68 kg/m.  Generalized: Well developed, in no acute distress  Lower extremities: 1+ pitting edema noted in the feet.  Erythema noted in the toes extending to middle of the foot.  Warm to touch bilaterally.  Neurological examination  Mentation: Alert oriented to time, place, history taking. Follows all commands speech and language fluent Cranial nerve II-XII: Pupils were equal round reactive to light. Extraocular movements were full, visual field were full on confrontational test. Facial sensation and strength were normal. Uvula tongue midline. Head turning and shoulder shrug  were  normal and symmetric. Motor: The motor testing reveals 5 over 5 strength of all 4 extremities. Good symmetric motor tone is noted throughout.  Sensory: Sensory testing is intact to soft touch on all 4 extremities.  Pinprick and vibration sensation intact in the lower extremities.  No evidence of extinction is noted.  Coordination:  Cerebellar testing reveals good finger-nose-finger and heel-to-shin bilaterally.  Gait and station: Gait is normal.  Reflexes: Deep tendon reflexes are symmetric and normal bilaterally.   DIAGNOSTIC DATA (LABS, IMAGING, TESTING) - I reviewed patient records, labs, notes, testing and imaging myself where available.  Lab Results  Component Value Date   WBC 7.6 05/19/2010   HGB 16.0 05/19/2010   HCT 46.6 05/19/2010   MCV 89.3 05/19/2010   PLT 226 05/19/2010      ASSESSMENT AND PLAN 67 y.o. year old male  has a past medical history of Anxiety disorder, Carpal tunnel syndrome, Cervical spondylosis without myelopathy (05/07/2013), Foot fracture, Gait disorder (05/07/2013), Hypertension, Joint pain, knee, Pain in joint, shoulder region, Post traumatic stress disorder, Restless leg syndrome, Spinal stenosis in cervical region, Spondylosis, and Stroke (HCC). here with :  1.  Cervical spondylosis without myelopathy 2.  Right hip pain  The patient will continue on Lyrica and diazepam.  The patient is complaining of pain starting at the hip and radiating midway down the leg on the lateral aspect-right side greater than left.  I advised that he should discuss this with his PCP as it may be an issue with the hip.  I advised that we can consider repeating MRI of the cervical in the future if needed.  He should also follow-up with his vascular physician regarding lower extremity swelling and redness.  Patient voiced understanding.  He will follow-up in 1 year or sooner if needed.     Butch Penny, MSN, NP-C 11/02/2017, 1:43 PM [facility] [address],  [address] [place], [place] [address] [phone]

## 2017-11-02 NOTE — Progress Notes (Signed)
I have read the note, and I agree with the clinical assessment and plan.  Laderrick Wilk K Ariona Deschene   

## 2017-11-02 NOTE — Patient Instructions (Signed)
Your Plan:  Continue Lyrica and Diazepam  Follow-up with PCP for right hip May consider repeating MRI cervical spine in the future If your symptoms worsen or you develop new symptoms please let us know.   Thank you for coming to see [place] at [facility]. I hope we have been able to provide you high quality care today.  You may receive a patient satisfaction survey over the next few weeks. We would appreciate your feedback and comments so that we may continue to improve ourselves and the health of our patients.

## 2017-11-07 ENCOUNTER — Telehealth: Payer: Self-pay | Admitting: Adult Health

## 2017-11-07 DIAGNOSIS — M542 Cervicalgia: Secondary | ICD-10-CM

## 2017-11-07 DIAGNOSIS — M47812 Spondylosis without myelopathy or radiculopathy, cervical region: Secondary | ICD-10-CM

## 2017-11-07 NOTE — Telephone Encounter (Addendum)
Pt states he saw his PCP today and was advised to have NP Megan set up his clavical MRI.  Pt stated that he was told by NP Aundra MilletMegan that if his PCP would not be able to she would.  Please call

## 2017-11-07 NOTE — Telephone Encounter (Signed)
Spoke to pt and he is wanting to proceed with the mri cervical, but can do hip area as well??  I told him I would relay to her and then she would order appropriate test for him.

## 2017-11-08 ENCOUNTER — Telehealth: Payer: Self-pay | Admitting: Adult Health

## 2017-11-08 NOTE — Addendum Note (Signed)
Addended by: Enedina FinnerMILLIKAN, Candie Gintz P on: 11/08/2017 09:41 AM   Modules accepted: Orders

## 2017-11-08 NOTE — Telephone Encounter (Signed)
Medicare/medicaid order sent to GI. They will reach out to the pt to schedule.  °

## 2017-11-08 NOTE — Telephone Encounter (Signed)
LMVM for pt that MRI cervical was ordered, referrals / authorization aware of your today request).  He is to call back as needed.

## 2017-11-08 NOTE — Telephone Encounter (Signed)
MRI cervical spine ordered. Will proceed with this test first.

## 2017-11-18 ENCOUNTER — Ambulatory Visit
Admission: RE | Admit: 2017-11-18 | Discharge: 2017-11-18 | Disposition: A | Discharge status: Home / Self Care | Payer: Medicare Other | Source: Ambulatory Visit | Attending: Adult Health | Admitting: Adult Health

## 2017-11-18 DIAGNOSIS — M542 Cervicalgia: Secondary | ICD-10-CM

## 2017-11-18 DIAGNOSIS — M47812 Spondylosis without myelopathy or radiculopathy, cervical region: Secondary | ICD-10-CM

## 2017-11-22 ENCOUNTER — Telehealth: Payer: Self-pay | Admitting: *Deleted

## 2017-11-22 NOTE — Telephone Encounter (Signed)
I spoke to pt and relayed that the MRI cervical results showed no change (when compared to MRI cervical from 2014).  He relayed understanding.  He relayed multiple areas of concern (muscle atrophy, pain down both legs, feet swelling).  He will contact vascular about his swollen feet, (discoloration).  Per MM/NP she did relay to contact pcp about leg/hip pain, and vascular about feet swelling and color.   Pt is aware.

## 2017-11-22 NOTE — Telephone Encounter (Signed)
-----   Message from Butch PennyMegan Millikan, NP sent at 11/21/2017  7:13 AM EDT ----- No change compared to scan from 2014.please call patient.

## 2017-12-23 ENCOUNTER — Other Ambulatory Visit: Payer: Self-pay

## 2017-12-23 DIAGNOSIS — I739 Peripheral vascular disease, unspecified: Secondary | ICD-10-CM

## 2017-12-23 DIAGNOSIS — R0989 Other specified symptoms and signs involving the circulatory and respiratory systems: Secondary | ICD-10-CM

## 2018-01-24 ENCOUNTER — Ambulatory Visit: Payer: Medicare Other | Admitting: Neurology

## 2018-02-10 ENCOUNTER — Ambulatory Visit (HOSPITAL_COMMUNITY)
Admission: RE | Admit: 2018-02-10 | Discharge: 2018-02-10 | Disposition: A | Discharge status: Home / Self Care | Payer: Medicare Other | Source: Ambulatory Visit | Attending: Vascular Surgery | Admitting: Vascular Surgery

## 2018-02-10 ENCOUNTER — Ambulatory Visit (INDEPENDENT_AMBULATORY_CARE_PROVIDER_SITE_OTHER)
Admission: RE | Admit: 2018-02-10 | Discharge: 2018-02-10 | Disposition: A | Discharge status: Home / Self Care | Payer: Medicare Other | Source: Ambulatory Visit | Attending: Vascular Surgery | Admitting: Vascular Surgery

## 2018-02-10 DIAGNOSIS — I739 Peripheral vascular disease, unspecified: Secondary | ICD-10-CM | POA: Diagnosis not present

## 2018-02-10 DIAGNOSIS — R9389 Abnormal findings on diagnostic imaging of other specified body structures: Secondary | ICD-10-CM | POA: Insufficient documentation

## 2018-02-10 DIAGNOSIS — R0989 Other specified symptoms and signs involving the circulatory and respiratory systems: Secondary | ICD-10-CM | POA: Diagnosis present

## 2018-02-10 DIAGNOSIS — Z87891 Personal history of nicotine dependence: Secondary | ICD-10-CM | POA: Insufficient documentation

## 2018-02-10 DIAGNOSIS — I10 Essential (primary) hypertension: Secondary | ICD-10-CM | POA: Diagnosis not present

## 2018-02-15 ENCOUNTER — Ambulatory Visit (INDEPENDENT_AMBULATORY_CARE_PROVIDER_SITE_OTHER): Payer: Medicare Other | Admitting: Vascular Surgery

## 2018-02-15 ENCOUNTER — Encounter: Payer: Self-pay | Admitting: Vascular Surgery

## 2018-02-15 VITALS — BP 140/56 | HR 56 | Temp 97.6°F | Resp 18 | Ht 72.0 in | Wt 164.0 lb

## 2018-02-15 DIAGNOSIS — I739 Peripheral vascular disease, unspecified: Secondary | ICD-10-CM

## 2018-02-15 NOTE — Progress Notes (Signed)
REASON FOR CONSULT:    Left carotid bruit and peripheral vascular disease.  The consult is requested by Placido Sou, NP.  HPI:   Matthew [name] is a pleasant 67 y.o. male, who was referred for evaluation of peripheral vascular disease and also left carotid bruit.  The patient is right-handed.  He does think that he had a small stroke several years ago associated with some left arm weakness.  He does not remember the details of this.  More importantly has had multiple operations on his neck and has severe degenerative disc disease of the cervical spine.  It sounds like a lot of his arm weakness which she has had is related to his cervical disc disease.  I do not get any clear-cut history of claudication or rest pain.  He describes pain that begins in his right hip and radiates down the lateral aspect of his leg and also similar symptoms in the left leg.  This occurs with sitting, standing, and ambulating.  The pain does not appear to be related specifically with ambulation so I do not think this represents claudication.  He describes the pain as a "8 out of 10 all the time."  His risk factors for peripheral vascular disease include hypertension and a remote history of tobacco use.  He denies any history of diabetes, hypercholesterolemia, or family history of premature cardiovascular disease.  I have reviewed the records from the referring office.  The patient was most recently seen on June 3 of this year.  At that time the patient had a left carotid bruit.  Also had evidence of peripheral vascular disease.  He is sent for vascular consultation  Past Medical History:  Diagnosis Date  . Anxiety disorder   . Carpal tunnel syndrome    Bilateral  . Cervical spondylosis without myelopathy 05/07/2013  . Foot fracture    right foot  . Gait disorder 05/07/2013  . Hypertension   . Joint pain, knee   . Pain in joint, shoulder region   . Post traumatic stress disorder   . Restless leg  syndrome   . Spinal stenosis in cervical region   . Spondylosis   . Stroke [facility])     Family History  Problem Relation Age of Onset  . Cancer Brother 60       brain tumor  . Cancer - Lung Mother   . Diabetes Father   . Hypertension Other   . Stroke Other   . Diabetes Other     SOCIAL HISTORY: Social History   Socioeconomic History  . Marital status: Divorced    Spouse name: Not on file  . Number of children: 3  . Years of education: college  . Highest education level: Not on file  Occupational History    Employer: UNEMPLOYED    Comment: retired  Engineer, production  . Financial resource strain: Not on file  . Food insecurity:    Worry: Not on file    Inability: Not on file  . Transportation needs:    Medical: Not on file    Non-medical: Not on file  Tobacco Use  . Smoking status: Former Games developer  . Smokeless tobacco: Never Used  . Tobacco comment: quit 8 years  ago  Substance and Sexual Activity  . Alcohol use: No    Alcohol/week: 0.0 standard drinks    Comment: quit drinking 8 years ago  . Drug use: No  . Sexual activity: Not on file  Lifestyle  . Physical  activity:    Days per week: Not on file    Minutes per session: Not on file  . Stress: Not on file  Relationships  . Social connections:    Talks on phone: Not on file    Gets together: Not on file    Attends religious service: Not on file    Active member of club or organization: Not on file    Attends meetings of clubs or organizations: Not on file    Relationship status: Not on file  . Intimate partner violence:    Fear of current or ex partner: Not on file    Emotionally abused: Not on file    Physically abused: Not on file    Forced sexual activity: Not on file  Other Topics Concern  . Not on file  Social History Narrative   Patient is right handed, consumes one cup of caffeine daily, resides in home with roommate.    Allergies  Allergen Reactions  . Penicillins   . Codeine     Current  Outpatient Medications  Medication Sig Dispense Refill  . amLODipine (NORVASC) 5 MG tablet     . ASPIRIN LOW DOSE 81 MG EC tablet   11  . cetirizine (ZYRTEC) 10 MG tablet   11  . diazepam (VALIUM) 5 MG tablet TAKE 1 TABLET BY MOUTH EVERY NIGHT AT BEDTIME AS NEEDED FOR MUSCLE SPASMS 90 tablet 1  . fluticasone (FLONASE) 50 MCG/ACT nasal spray   0  . ibuprofen (ADVIL,MOTRIN) 600 MG tablet TAKE 1 TABLET BY MOUTH EVERY 8 HOURS AS NEEDED 270 tablet 0  . pentoxifylline (TRENTAL) 400 MG CR tablet     . pregabalin (LYRICA) 100 MG capsule Take 1 capsule (100 mg total) by mouth daily. 90 capsule 1   No current facility-administered medications for this visit.     REVIEW OF SYSTEMS:  [X]  denotes positive finding, [ ]  denotes negative finding Cardiac  Comments:  Chest pain or chest pressure:    Shortness of breath upon exertion:    Short of breath when lying flat:    Irregular heart rhythm:        Vascular    Pain in calf, thigh, or hip brought on by ambulation: x   Pain in feet at night that wakes you up from your sleep:  x   Blood clot in your veins:    Leg swelling:  x       Pulmonary    Oxygen at home:    Productive cough:     Wheezing:         Neurologic    Sudden weakness in arms or legs:  x   Sudden numbness in arms or legs:  x   Sudden onset of difficulty speaking or slurred speech:    Temporary loss of vision in one eye:     Problems with dizziness:         Gastrointestinal    Blood in stool:     Vomited blood:         Genitourinary    Burning when urinating:     Blood in urine:        Psychiatric    Major depression:         Hematologic    Bleeding problems:    Problems with blood clotting too easily:        Skin    Rashes or ulcers:        Constitutional    Fever  or chills:     PHYSICAL EXAM:   Vitals:   02/15/18 1303 02/15/18 1304  BP: 132/61 (!) 140/56  Pulse: (!) 56   Resp: 18   Temp: 97.6 F (36.4 C)   SpO2: 97%   Weight: 164 lb (74.4 kg)     Height: 6' (1.829 m)     GENERAL: The patient is a well-nourished male, in no acute distress. The vital signs are documented above. CARDIAC: There is a regular rate and rhythm.  VASCULAR: I do not detect carotid bruits. On the right side I cannot palpate a femoral pulse, popliteal or pedal pulses. On the left side he has a palpable femoral, popliteal, and posterior tibial pulse. PULMONARY: There is good air exchange bilaterally without wheezing or rales. ABDOMEN: Soft and non-tender with normal pitched bowel sounds.  I do not palpate an abdominal aortic aneurysm. MUSCULOSKELETAL: There are no major deformities or cyanosis. NEUROLOGIC: No focal weakness or paresthesias are detected. SKIN: There are no ulcers or rashes noted. PSYCHIATRIC: The patient has a normal affect.  DATA:    ARTERIAL DOPPLER STUDY: I have reviewed the arterial Doppler study that was done on 02/10/2018.   On the right side there was a monophasic dorsalis pedis and posterior tibial signal.  ABI was 61%.  Toe pressure was 65 mmHg.  On the left side there was a triphasic posterior tibial signal with a biphasic dorsalis pedis signal.  ABI was 84%.  Toe pressure was 54 mmHg.  CAROTID DUPLEX: I have reviewed the carotid duplex scan that was done on 02/10/2018.   There was a less than 39% carotid stenosis bilaterally.  There is antegrade flow in both vertebrals.  ASSESSMENT & PLAN:   PERIPHERAL VASCULAR DISEASE: This patient does have evidence of right iliac artery occlusive disease however I do not think his symptoms fit with the pattern of his disease.  He describes pain in both legs with standing, sitting, and ambulating.  He does not think his symptoms are worse on one side of the other despite that he fact that he has a palpable pulse in the left foot and a fairly reasonable ABI.  I explained to him multiple times that I do not think addressing his peripheral vascular disease on the right will necessarily help his  symptoms and that his symptoms sound more neurogenic.  He has significant back issues and his symptoms in his thighs sound more like sciatica.  I suggested perhaps seeing him back in a year and doing follow-up studies he wants to be seen sooner so have ordered follow-up ABIs in 3 months and I will see him back at that time.  LEFT CAROTID BRUIT: The patient had a left carotid bruit at the referring office.  Carotid duplex scan today shows no evidence of significant carotid disease.  He is on aspirin.  CHRONIC VENOUS INSUFFICIENCY: The patient does have some hyperpigmentation bilaterally consistent with CEAP clinical class 4a disease.  He does not describe any significant aching pain in his legs consistent with chronic venous insufficiency and I do not think any further work-up for this as needed.  Waverly Ferrari [facility] [phone]

## 2018-02-17 ENCOUNTER — Encounter: Payer: Self-pay | Admitting: Family Medicine

## 2018-02-17 ENCOUNTER — Other Ambulatory Visit: Payer: Self-pay

## 2018-02-17 DIAGNOSIS — I739 Peripheral vascular disease, unspecified: Secondary | ICD-10-CM

## 2018-03-03 ENCOUNTER — Telehealth: Payer: Self-pay | Admitting: Adult Health

## 2018-03-03 NOTE — Telephone Encounter (Signed)
Pt has called for a refill on his diazepam (VALIUM) 5 MG tablet [facility] - [place], [place] - [address] [phone] (Phone) [phone] (Fax)

## 2018-03-04 ENCOUNTER — Other Ambulatory Visit: Payer: Self-pay | Admitting: Neurology

## 2018-03-06 MED ORDER — DIAZEPAM 5 MG PO TABS: ORAL_TABLET | ORAL | 0 refills | Status: DC

## 2018-03-06 NOTE — Addendum Note (Signed)
Addended by: Enedina Finner on: 03/06/2018 09:28 AM   Modules accepted: Orders

## 2018-03-13 ENCOUNTER — Telehealth: Payer: Self-pay | Admitting: Adult Health

## 2018-03-13 MED ORDER — PREGABALIN 100 MG PO CAPS: 100.0000 mg | ORAL_CAPSULE | Freq: Every day | ORAL | 1 refills | Status: DC

## 2018-03-13 NOTE — Telephone Encounter (Signed)
Pt is asking for a call to discuss his request for a refill on his pregabalin (LYRICA) 100 MG capsule and his diazepam (VALIUM) 5 MG tablet pt said he got a letter from [facility] dated 10-2 that his request for a refill on the Valium was denied, pt is asking for a call to discuss

## 2018-03-13 NOTE — Telephone Encounter (Signed)
Drug Registry checked last fill lyrica 100mg  caps (one po qhs) 12/06/2017 # 90.

## 2018-03-13 NOTE — Telephone Encounter (Signed)
I called the patient.  Patient need another prescription for the Lyrica.  He has a refill on the diazepam. A prescription For Lyrica was sent in.  The patient has been seen by Dr. Thurston Hole, he indicates that MRI of the brain, cervical spine, and lumbar spine were done recently.  We will try to get the written reports of the studies.  The patient has not heard anything back about the results.

## 2018-03-13 NOTE — Addendum Note (Signed)
Addended by: Guy Begin on: 03/13/2018 04:49 PM   Modules accepted: Orders

## 2018-03-13 NOTE — Addendum Note (Signed)
Addended by: York Spaniel on: 03/13/2018 05:09 PM   Modules accepted: Orders

## 2018-03-14 ENCOUNTER — Telehealth: Payer: Self-pay | Admitting: Neurology

## 2018-03-14 DIAGNOSIS — M47816 Spondylosis without myelopathy or radiculopathy, lumbar region: Secondary | ICD-10-CM

## 2018-03-14 NOTE — Telephone Encounter (Signed)
Spoke with Programme researcher, broadcasting/film/video at [employer]. She sts. pt. had MRI's of lumbar and thoracic spines in August, but they have not done MRI's of brain and cervical spine.  She will fax the written reports to me at [phone]/fim

## 2018-03-14 NOTE — Telephone Encounter (Signed)
I called the patient.  I discussed the MRI results with him.  There may be a small syrinx in the lower spinal cord starting at the T6 level into the conus.  I am not clear that this is causing any symptoms for him.  The patient continues to have back pain and left hip and upper thigh pain with weightbearing primarily.  We will consider epidural steroid injection of the low back.  The patient will follow-up through our office on 30 March 2018.  MRI thoracic 01/06/18:  1.  Confirmed abnormal thoracic spinal cord T2/STIR signal, however, despite the suggestion of a generalized abnormal signal on the December 26, 2017 MRI, these images suggest the cord signal abnormalities limited to the central cord and is intermittent such as due to syrinx.  Other differential considerations such as demyelination and inflammatory myelitis are now felt less likely.  The levels T6-7, T10-11, T11-12, the central cord T2 hyperintensity measures up to 2 to 3 mm diameter. 2.  Extensive previous cervical spine ACDF to the C7 level.  Multifactorial mild spinal and up to severe neural foraminal stenosis suspected at C7-T1. 3.  Otherwise mild for age thoracic spine degeneration with no other thoracic spinal stenosis.  Subacute appearing T9 superior endplate Schmorl's node.   MRI lumbar 12/26/17:  1.  Possible abnormal lower spinal cord signal and spinal cord myomalacia. 2.  Stable MRI appearance of the lumbar spine since 2017 with a small rightward disc herniation and superimposed on advanced chronic disc and endplate degeneration at the L5-S1.  Subsequent mild to moderate lateral recess stenosis (greater on the right) and neural foraminal stenosis (greater on the left).

## 2018-03-15 ENCOUNTER — Other Ambulatory Visit: Payer: Self-pay | Admitting: Neurology

## 2018-03-15 DIAGNOSIS — M47816 Spondylosis without myelopathy or radiculopathy, lumbar region: Secondary | ICD-10-CM

## 2018-03-28 ENCOUNTER — Ambulatory Visit
Admission: RE | Admit: 2018-03-28 | Discharge: 2018-03-28 | Disposition: A | Discharge status: Home / Self Care | Payer: Medicare Other | Source: Ambulatory Visit | Attending: Neurology | Admitting: Neurology

## 2018-03-28 DIAGNOSIS — M47816 Spondylosis without myelopathy or radiculopathy, lumbar region: Secondary | ICD-10-CM

## 2018-03-28 MED ORDER — IOPAMIDOL (ISOVUE-M 200) INJECTION 41%
1.0000 mL | Freq: Once | INTRAMUSCULAR | Status: AC
  Administered 2018-03-28: 1 mL via EPIDURAL

## 2018-03-28 MED ORDER — METHYLPREDNISOLONE ACETATE 40 MG/ML INJ SUSP (RADIOLOG
120.0000 mg | Freq: Once | INTRAMUSCULAR | Status: AC
  Administered 2018-03-28: 120 mg via EPIDURAL

## 2018-03-28 NOTE — Discharge Instructions (Signed)

## 2018-03-30 ENCOUNTER — Encounter

## 2018-03-30 ENCOUNTER — Encounter: Payer: Self-pay | Admitting: Neurology

## 2018-03-30 ENCOUNTER — Other Ambulatory Visit: Payer: Self-pay

## 2018-03-30 ENCOUNTER — Ambulatory Visit (INDEPENDENT_AMBULATORY_CARE_PROVIDER_SITE_OTHER): Payer: Medicare Other | Admitting: Neurology

## 2018-03-30 VITALS — BP 174/77 | HR 69 | Resp 18 | Ht 72.0 in | Wt 161.0 lb

## 2018-03-30 DIAGNOSIS — G950 Syringomyelia and syringobulbia: Secondary | ICD-10-CM | POA: Diagnosis not present

## 2018-03-30 DIAGNOSIS — M47812 Spondylosis without myelopathy or radiculopathy, cervical region: Secondary | ICD-10-CM | POA: Diagnosis not present

## 2018-03-30 DIAGNOSIS — R269 Unspecified abnormalities of gait and mobility: Secondary | ICD-10-CM | POA: Diagnosis not present

## 2018-03-30 HISTORY — DX: Syringomyelia and syringobulbia: G95.0

## 2018-03-30 NOTE — Progress Notes (Signed)
Reason for visit: Spinal cord syrinx  Referring physician: [name] is a 67 y.o. male  History of present illness:  Matthew [name] is a 67 year old right-handed white male with a history of cervical spondylosis and a C4-5 cervical myelopathy.  The patient has a residual left-sided hyperreflexia and weakness of the legs, left greater than right.  The patient has a mild gait disorder associated with the cervical myelopathy.  The patient has had some low back issues as well with spondylitic changes at the L5-S1 level, the patient has pain into the right hip and down the right leg to the knee, occasionally he may have some pain in the left leg as well.  The patient just recently underwent an epidural steroid injection which has already started to help his leg pain and his ability to walk without increased pain.  The patient has been seen previously by Dr. Charlett Blake and MRI of the lumbar spine was done which also suggested some changes within the lower thoracic spinal cord.  For this reason, MRI of the thoracic spine was done and showed evidence of a small spinal cord syrinx from the T6 level down.  The patient is therefore sent to this office for an evaluation.  The patient has not had any significant recent dramatic change in his functional level.  Past Medical History:  Diagnosis Date  . Anxiety disorder   . Carpal tunnel syndrome    Bilateral  . Cervical spondylosis without myelopathy 05/07/2013  . Foot fracture    right foot  . Gait disorder 05/07/2013  . Hypertension   . Joint pain, knee   . Pain in joint, shoulder region   . Post traumatic stress disorder   . Restless leg syndrome   . Spinal stenosis in cervical region   . Spondylosis   . Stroke (HCC)   . Syrinx of spinal cord (HCC) 03/30/2018   Thoracic    Past Surgical History:  Procedure Laterality Date  . KNEE SURGERY  1980  . NECK SURGERY     2009 or 2010    Family History  Problem Relation Age of Onset   . Cancer Brother 60       brain tumor  . Cancer - Lung Mother   . Diabetes Father   . Hypertension Other   . Stroke Other   . Diabetes Other     Social history:  reports that he has quit smoking. He has never used smokeless tobacco. He reports that he does not drink alcohol or use drugs.  Medications:  Prior to Admission medications   Medication Sig Start Date End Date Taking? Authorizing Provider  amLODipine (NORVASC) 5 MG tablet  02/23/16  Yes [provider]  diazepam (VALIUM) 5 MG tablet TAKE 1 TABLET BY MOUTH EVERY NIGHT AT BEDTIME AS NEEDED FOR MUSCLE SPASMS 03/06/18  Yes Butch Penny, NP  pentoxifylline (TRENTAL) 400 MG CR tablet  02/24/16  Yes [provider]  pregabalin (LYRICA) 100 MG capsule Take 1 capsule (100 mg total) by mouth daily. 03/13/18  Yes York Spaniel, MD  ASPIRIN LOW DOSE 81 MG EC tablet  11/06/15   [provider]  cetirizine (ZYRTEC) 10 MG tablet  11/06/15   [provider]  fluticasone Aleda Grana) 50 MCG/ACT nasal spray  10/10/15   [provider]  ibuprofen (ADVIL,MOTRIN) 600 MG tablet TAKE 1 TABLET BY MOUTH EVERY 8 HOURS AS NEEDED Patient not taking: Reported on 03/30/2018  York Spaniel, MD      Allergies  Allergen Reactions  . Penicillins   . Codeine     ROS:  Out of a complete 14 system review of symptoms, the patient complains only of the following symptoms, and all other reviewed systems are negative.  Joint pain, muscle cramps, aching muscles Weakness Not enough sleep Sleepiness, restless legs  Blood pressure (!) 174/77, pulse 69, resp. rate 18, height 6' (1.829 m), weight 161 lb (73 kg).  Physical Exam  General: The patient is alert and cooperative at the time of the examination.  Eyes: Pupils are equal, round, and reactive to light. Discs are flat bilaterally.  Neck: The neck is supple, no carotid bruits are noted.  Respiratory: The respiratory examination is  clear.  Cardiovascular: The cardiovascular examination reveals a regular rate and rhythm, no obvious murmurs or rubs are noted.  Skin: Extremities are without significant edema.  Neurologic Exam  Mental status: The patient is alert and oriented x 3 at the time of the examination. The patient has apparent normal recent and remote memory, with an apparently normal attention span and concentration ability.  Cranial nerves: Facial symmetry is present. There is good sensation of the face to pinprick and soft touch bilaterally. The strength of the facial muscles and the muscles to head turning and shoulder shrug are normal bilaterally. Speech is well enunciated, no aphasia or dysarthria is noted. Extraocular movements are full. Visual fields are full. The tongue is midline, and the patient has symmetric elevation of the soft palate. No obvious hearing deficits are noted.  Motor: The motor testing reveals 5 over 5 strength of the upper extremities.  The patient has 4/5 strength with hip flexion on the left, 4+/5 strength on the right, some weakness with extension the knee was noted on the left, normal on the right, no evidence of foot drop is seen on either side.  Good symmetric motor tone is noted throughout.  Sensory: Sensory testing is intact to pinprick, soft touch and vibration sensation on all 4 extremities. No evidence of extinction is noted.  Coordination: Cerebellar testing reveals good finger-nose-finger and heel-to-shin bilaterally.  Gait and station: Gait is normal. Tandem gait is slightly unsteady. Romberg is negative. No drift is seen.  Reflexes: Deep tendon reflexes are notable for an increase in the left biceps reflex as compared to the right and elevation in the left knee jerk and left ankle jerk reflex as compared to the right.   MRI thoracic 01/06/18:  1.  Confirmed abnormal thoracic spinal cord T2/STIR signal, however, despite the suggestion of a generalized abnormal signal on  the December 26, 2017 MRI, these images suggest the cord signal abnormalities limited to the central cord and is intermittent such as due to syrinx.  Other differential considerations such as demyelination and inflammatory myelitis are now felt less likely.  The levels T6-7, T10-11, T11-12, the central cord T2 hyperintensity measures up to 2 to 3 mm diameter. 2.  Extensive previous cervical spine ACDF to the C7 level.  Multifactorial mild spinal and up to severe neural foraminal stenosis suspected at C7-T1. 3.  Otherwise mild for age thoracic spine degeneration with no other thoracic spinal stenosis.  Subacute appearing T9 superior endplate Schmorl's node.   MRI lumbar 12/26/17:  1.  Possible abnormal lower spinal cord signal and spinal cord myomalacia. 2.  Stable MRI appearance of the lumbar spine since 2017 with a small rightward disc herniation and superimposed on advanced chronic disc and  endplate degeneration at the L5-S1.  Subsequent mild to moderate lateral recess stenosis (greater on the right) and neural foraminal stenosis (greater on the left).  * MRI scan images were reviewed online. I agree with the written report.    Assessment/Plan:  1.  Cervical myelopathy, C4-5 level, mild left-sided spasticity  2.  Mild gait disorder  3.  Thoracic syrinx  4.  Lumbar spondylosis, L5-S1 level, right leg pain  The thoracic syrinx likely is asymptomatic.  This may be a congenital finding, may need to follow this over time, recheck a scan in a couple years.  The patient does have residual from his cervical myelopathy with weakness in the legs and left-sided greater than right-sided spasticity.  The patient is now having symptoms from his low back with discomfort and pain into the right leg.  The patient just recently had an epidural steroid injection, we may repeat this in the future.  Ultimately, the patient may require surgery on the low back.  Matthew Palau MD 03/30/2018 10:59 AM  [facility] [address] [place], [place] [address]  Phone [phone] Fax [phone]

## 2018-05-24 ENCOUNTER — Ambulatory Visit: Payer: Medicare Other | Admitting: Vascular Surgery

## 2018-05-24 ENCOUNTER — Encounter (HOSPITAL_COMMUNITY): Payer: Medicare Other

## 2018-06-29 ENCOUNTER — Telehealth: Payer: Self-pay | Admitting: Neurology

## 2018-06-29 MED ORDER — DIAZEPAM 5 MG PO TABS: ORAL_TABLET | ORAL | 1 refills | Status: DC

## 2018-06-29 MED ORDER — PREGABALIN 100 MG PO CAPS: 100.0000 mg | ORAL_CAPSULE | Freq: Every day | ORAL | 1 refills | Status: DC

## 2018-06-29 NOTE — Telephone Encounter (Signed)
Pt is asking for a refill on his diazepam (VALIUM) 5 MG tablet and pregabalin (LYRICA) 100 MG capsule [facility] No changes to insurance in new year

## 2018-06-29 NOTE — Telephone Encounter (Signed)
The prescription for the Lyrica and diazepam were sent in.

## 2018-06-29 NOTE — Addendum Note (Signed)
Addended by: York Spaniel on: 06/29/2018 10:32 AM   Modules accepted: Orders

## 2018-06-29 NOTE — Telephone Encounter (Signed)
Pt requesting refills for Diazepam and Lyrica. Drug registry verified for Diazepam, Last refill was written on 05/23/18 # 30 for a 30 day supply provided by MM, NP. Last o/v 03/30/18 and f/u scheduled for 11/06/18 with MM, NP

## 2018-11-01 ENCOUNTER — Telehealth: Payer: Self-pay | Admitting: *Deleted

## 2018-11-01 NOTE — Telephone Encounter (Signed)
Due to current COVID 19 pandemic, our office is severely reducing in office visits until further notice, in order to minimize the risk to our patients and healthcare providers.  He states no camera devices.  Consented to appt as telephone visit. Pt understands that although there may be some limitations with this type of visit, we will take all precautions to reduce any security or privacy concerns.  Pt understands that this will be treated like an in office visit and we will file with pt's insurance, and there may be a patient responsible charge related to this service.

## 2018-11-06 ENCOUNTER — Encounter: Payer: Self-pay | Admitting: Adult Health

## 2018-11-06 ENCOUNTER — Ambulatory Visit (INDEPENDENT_AMBULATORY_CARE_PROVIDER_SITE_OTHER): Payer: Medicare Other | Admitting: Adult Health

## 2018-11-06 ENCOUNTER — Other Ambulatory Visit: Payer: Self-pay

## 2018-11-06 DIAGNOSIS — M47812 Spondylosis without myelopathy or radiculopathy, cervical region: Secondary | ICD-10-CM | POA: Diagnosis not present

## 2018-11-06 DIAGNOSIS — G950 Syringomyelia and syringobulbia: Secondary | ICD-10-CM

## 2018-11-06 MED ORDER — PREGABALIN 100 MG PO CAPS: 100.0000 mg | ORAL_CAPSULE | Freq: Every day | ORAL | 1 refills | Status: DC

## 2018-11-06 MED ORDER — DIAZEPAM 5 MG PO TABS: ORAL_TABLET | ORAL | 1 refills | Status: DC

## 2018-11-06 NOTE — Progress Notes (Signed)
I have read the note, and I agree with the clinical assessment and plan.  Matthew [name] Matthew [name]   

## 2018-11-06 NOTE — Progress Notes (Signed)
[facility] [address] [place]. [place] [address] [phone]     Virtual Visit via Telephone Note  I connected with Matthew [name] on 11/06/18 at  2:00 PM EDT by telephone located remotely at [facility] and verified that I am speaking with the correct person using two identifiers who reports being located at home   Visit scheduled by RN. She discussed the limitations, risks, security and privacy concerns of performing an evaluation and management service by telephone and the availability of in person appointments. I also discussed with the patient that there may be a patient responsible charge related to this service. The patient expressed understanding and agreed to proceed. See telephone note for consent and additional scheduling information.    History of Present Illness:  Matthew [name] is a 68 y.o. male who has been followed in this office for cervical spondylosis and syrinx of spinal cord.  He was initially scheduled for face-to-face office follow up visit today time but due to COVID19, visit rescheduled for non-face-to-face telephone visit with patients consent. Unable to participate in video visit due to lack of access to device with camera.    Today 11/06/18  Matthew [name] is a 68 year old male with a history of cervical spondylosis and cervical myelopathy.  He reports for the most part he has remained stable.  He continues to have weakness and spasticity in the legs.  He states that approximate 1 month ago he was putting up shutters and had a pain that started in the neck going down to his chest.  He reports that it felt as if he was having a heart attack or as if he had broken a rib.  He states the pain went away fairly quickly.  He states that he laid down and resting once or the next day.  Reports that his neck pain actually gotten better since then.  Reports that Lyrica and Valium are still beneficial for him.  Denies any new symptoms.    HISTORY 03/30/18: Matthew [name] is a 68 year old right-handed white male with a history of cervical spondylosis and a C4-5 cervical myelopathy.  The patient has a residual left-sided hyperreflexia and weakness of the legs, left greater than right.  The patient has a mild gait disorder associated with the cervical myelopathy.  The patient has had some low back issues as well with spondylitic changes at the L5-S1 level, the patient has pain into the right hip and down the right leg to the knee, occasionally he may have some pain in the left leg as well.  The patient just recently underwent an epidural steroid injection which has already started to help his leg pain and his ability to walk without increased pain.  The patient has been seen previously by Dr. Charlett Blake and MRI of the lumbar spine was done which also suggested some changes within the lower thoracic spinal cord.  For this reason, MRI of the thoracic spine was done and showed evidence of a small spinal cord syrinx from the T6 level down.  The patient is therefore sent to this office for an evaluation.  The patient has not had any significant recent dramatic change in his functional level.  Observations/Objective:   Neurological examination  Mentation: Alert oriented to time, place, history taking. Speech and language fluent  Assessment and Plan:  1: Cervical spondylosis 2: Syrinx of spinal cord  Overall the patient has remained stable.  He will continue on Lyrica 100 mg daily, diazepam 5 mg  at bedtime for muscle spasms.  He is advised that if his symptoms worsen or he develops new symptoms he should let us know.  He will follow-up in 6 months or sooner if needed.  Follow Up Instructions:     FU 6 months   I discussed the assessment and treatment plan with the patient.  The patient was provided an opportunity to ask questions and all were answered to their satisfaction. The patient agreed with the plan and verbalized an understanding of  the instructions.   I provided 15 minutes of non-face-to-face time during this encounter.    Butch PennyMegan Reford Olliff NP-C  [facility] [address] [place], [place] [id number]  Phone [phone] Fax [phone]

## 2019-05-15 ENCOUNTER — Encounter: Payer: Self-pay | Admitting: Adult Health

## 2019-05-15 ENCOUNTER — Ambulatory Visit (INDEPENDENT_AMBULATORY_CARE_PROVIDER_SITE_OTHER): Payer: Medicare HMO | Admitting: Adult Health

## 2019-05-15 ENCOUNTER — Other Ambulatory Visit: Payer: Self-pay

## 2019-05-15 VITALS — BP 159/57 | HR 58 | Temp 97.4°F | Ht 72.0 in | Wt 150.2 lb

## 2019-05-15 DIAGNOSIS — G950 Syringomyelia and syringobulbia: Secondary | ICD-10-CM | POA: Diagnosis not present

## 2019-05-15 DIAGNOSIS — R6889 Other general symptoms and signs: Secondary | ICD-10-CM | POA: Diagnosis not present

## 2019-05-15 DIAGNOSIS — M47812 Spondylosis without myelopathy or radiculopathy, cervical region: Secondary | ICD-10-CM

## 2019-05-15 MED ORDER — DIAZEPAM 5 MG PO TABS: ORAL_TABLET | ORAL | 1 refills | Status: DC

## 2019-05-15 MED ORDER — PREGABALIN 100 MG PO CAPS: 100.0000 mg | ORAL_CAPSULE | Freq: Every day | ORAL | 1 refills | Status: DC

## 2019-05-15 NOTE — Patient Instructions (Signed)
Your Plan:  Continue Lyrica and Valium  Get established with PCP to evaluate circulation in lower extremeties If your symptoms worsen or you develop new symptoms please let us know.   Thank you for coming to see [place] at [facility]. I hope we have been able to provide you high quality care today.  You may receive a patient satisfaction survey over the next few weeks. We would appreciate your feedback and comments so that we may continue to improve ourselves and the health of our patients.

## 2019-05-15 NOTE — Progress Notes (Signed)
I have read the note, and I agree with the clinical assessment and plan.  Luanna Weesner K Jamiyla Ishee   

## 2019-05-15 NOTE — Progress Notes (Signed)
PATIENT: Matthew [name] DOB: 04-12-1951  REASON FOR VISIT: follow up HISTORY FROM: patient  HISTORY OF PRESENT ILLNESS: Today 05/15/19:  Matthew [name] is a 68 year old male with a history of cervical spondylosis and cervical myelopathy.  He returns today for follow-up.  He reports that Valium and Lyrica continue to work well for him.  He states that he has not has noticed some changes with his walking.  He states that he has to stop and rest more.  He states that the lower extremities particularly mid calf down to the toes is red with the toes being a purplish color.  He feels that he does have some circulation issues but reports that his primary care has not addressed these issues.  Reports that he is in the process of finding a new primary care.  He has had a biopsy in the past that ruled out small fiber neuropathy in the lower extremities.  He returns today for an evaluation.  HISTORY Matthew [name] is a 68 year old male with a history of cervical spondylosis and cervical myelopathy.  He reports for the most part he has remained stable.  He continues to have weakness and spasticity in the legs.  He states that approximate 1 month ago he was putting up shutters and had a pain that started in the neck going down to his chest.  He reports that it felt as if he was having a heart attack or as if he had broken a rib.  He states the pain went away fairly quickly.  He states that he laid down and resting once or the next day.  Reports that his neck pain actually gotten better since then.  Reports that Lyrica and Valium are still beneficial for him.  Denies any new symptoms.   REVIEW OF SYSTEMS: Out of a complete 14 system review of symptoms, the patient complains only of the following symptoms, and all other reviewed systems are negative.  See HPI  ALLERGIES: Allergies  Allergen Reactions  . Penicillins   . Codeine     HOME MEDICATIONS: Outpatient Medications Prior to Visit  Medication Sig  Dispense Refill  . diazepam (VALIUM) 5 MG tablet TAKE 1 TABLET BY MOUTH EVERY NIGHT AT BEDTIME AS NEEDED FOR MUSCLE SPASMS 90 tablet 1  . pregabalin (LYRICA) 100 MG capsule Take 1 capsule (100 mg total) by mouth daily. 90 capsule 1  . amLODipine (NORVASC) 5 MG tablet     . ASPIRIN LOW DOSE 81 MG EC tablet   11  . cetirizine (ZYRTEC) 10 MG tablet   11  . fluticasone (FLONASE) 50 MCG/ACT nasal spray   0  . ibuprofen (ADVIL,MOTRIN) 600 MG tablet TAKE 1 TABLET BY MOUTH EVERY 8 HOURS AS NEEDED (Patient not taking: Reported on 03/30/2018) 270 tablet 0  . pentoxifylline (TRENTAL) 400 MG CR tablet      No facility-administered medications prior to visit.     PAST MEDICAL HISTORY: Past Medical History:  Diagnosis Date  . Anxiety disorder   . Carpal tunnel syndrome    Bilateral  . Cervical spondylosis without myelopathy 05/07/2013  . Foot fracture    right foot  . Gait disorder 05/07/2013  . Hypertension   . Joint pain, knee   . Pain in joint, shoulder region   . Post traumatic stress disorder   . Restless leg syndrome   . Spinal stenosis in cervical region   . Spondylosis   . Stroke (HCC)   . Syrinx of spinal  cord ([place]) 03/30/2018   Thoracic    PAST SURGICAL HISTORY: Past Surgical History:  Procedure Laterality Date  . KNEE SURGERY  1980  . NECK SURGERY     2009 or 2010    FAMILY HISTORY: Family History  Problem Relation Age of Onset  . Cancer Brother 60       brain tumor  . Cancer - Lung Mother   . Diabetes Father   . Hypertension Other   . Stroke Other   . Diabetes Other     SOCIAL HISTORY: Social History   Socioeconomic History  . Marital status: Divorced    Spouse name: Not on file  . Number of children: 3  . Years of education: college  . Highest education level: Not on file  Occupational History    Employer: UNEMPLOYED    Comment: retired  Scientific laboratory technician  . Financial resource strain: Not on file  . Food insecurity    Worry: Not on file    Inability: Not  on file  . Transportation needs    Medical: Not on file    Non-medical: Not on file  Tobacco Use  . Smoking status: Former Research scientist (life sciences)  . Smokeless tobacco: Never Used  . Tobacco comment: quit 8 years  ago  Substance and Sexual Activity  . Alcohol use: No    Alcohol/week: 0.0 standard drinks    Comment: quit drinking 8 years ago  . Drug use: No  . Sexual activity: Not on file  Lifestyle  . Physical activity    Days per week: Not on file    Minutes per session: Not on file  . Stress: Not on file  Relationships  . Social Herbalist on phone: Not on file    Gets together: Not on file    Attends religious service: Not on file    Active member of club or organization: Not on file    Attends meetings of clubs or organizations: Not on file    Relationship status: Not on file  . Intimate partner violence    Fear of current or ex partner: Not on file    Emotionally abused: Not on file    Physically abused: Not on file    Forced sexual activity: Not on file  Other Topics Concern  . Not on file  Social History Narrative   Patient is right handed, consumes one cup of caffeine daily, resides in home with roommate.      PHYSICAL EXAM  Vitals:   05/15/19 1246  BP: (!) 159/57  Pulse: (!) 58  Temp: (!) 97.4 F (36.3 C)  TempSrc: Oral  Weight: 150 lb 3.2 oz (68.1 kg)  Height: 6' (1.829 m)   Body mass index is 20.37 kg/m.  Generalized: Well developed, in no acute distress   Neurological examination  Mentation: Alert oriented to time, place, history taking. Follows all commands speech and language fluent Cranial nerve II-XII: Pupils were equal round reactive to light. Extraocular movements were full, visual field were full on confrontational test. Head turning and shoulder shrug  were normal and symmetric. Motor: The motor testing reveals 5 over 5 strength of all 4 extremities. Good symmetric motor tone is noted throughout.  Sensory: Sensory testing is intact to soft  touch on all 4 extremities. No evidence of extinction is noted.  Coordination: Cerebellar testing reveals good finger-nose-finger and heel-to-shin bilaterally.  Gait and station: Gait is slightly unsteady.. Tandem gait not attempted. Reflexes: Deep tendon reflexes are  symmetric and normal bilaterally.   DIAGNOSTIC DATA (LABS, IMAGING, TESTING) - I reviewed patient records, labs, notes, testing and imaging myself where available.  Lab Results  Component Value Date   WBC 7.6 05/19/2010   HGB 16.0 05/19/2010   HCT 46.6 05/19/2010   MCV 89.3 05/19/2010   PLT 226 05/19/2010      ASSESSMENT AND PLAN 68 y.o. year old male  has a past medical history of Anxiety disorder, Carpal tunnel syndrome, Cervical spondylosis without myelopathy (05/07/2013), Foot fracture, Gait disorder (05/07/2013), Hypertension, Joint pain, knee, Pain in joint, shoulder region, Post traumatic stress disorder, Restless leg syndrome, Spinal stenosis in cervical region, Spondylosis, Stroke (HCC), and Syrinx of spinal cord (HCC) (03/30/2018). here with:  1.  Cervical spondylosis with myelopathy 2.  Syrinx of spinal cord  -Continue Valium -Continue Lyrica -Follow-up with PCP regarding circulation in lower extremities  Advised if symptoms worsen or he develops new symptoms he should let us know.  He will follow-up in 6 months or sooner if needed  I spent 15 minutes with the patient. 50% of this time was spent reviewing plan of care.   Matthew Penny, Matthew [name], Matthew [name] 05/15/2019, 1:02 PM [facility] [address], [address] [place], [place] [address] [phone]

## 2019-05-16 ENCOUNTER — Telehealth: Payer: Self-pay | Admitting: Neurology

## 2019-05-16 NOTE — Telephone Encounter (Signed)
Patient called to advise he was informed a new referral would be needed in order to be scheduled with Nino Glow at [facility].   Please follow up.

## 2019-05-16 NOTE — Telephone Encounter (Signed)
I called pt back.  He is asking for [place] to refer pt to [facility].  I relayed that per note that even though Dr. Jannifer Franklin may have done this previously it is per MM/NP that he needs to have a pcp for evaluate for circulation issues.   I relayed multiple times that he needs to have pcp for any medical need that comes up.  He repeated himself multiple times and I reiterated needing pcp ([facility] network) call offices to get established.  He may need to go to urgent care if toe, feet gets worse.  He finally stated thank you for not helping me.

## 2019-05-16 NOTE — Telephone Encounter (Signed)
I also advised in the office visit yesterday- that he needs to see his PCP. He voiced that he was not happy with his PCP so I advised that he get established with someone else.

## 2019-05-16 NOTE — Telephone Encounter (Signed)
Noted  

## 2019-05-18 DIAGNOSIS — M79675 Pain in left toe(s): Secondary | ICD-10-CM | POA: Diagnosis not present

## 2019-05-21 DIAGNOSIS — I10 Essential (primary) hypertension: Secondary | ICD-10-CM | POA: Diagnosis not present

## 2019-05-21 DIAGNOSIS — R200 Anesthesia of skin: Secondary | ICD-10-CM | POA: Diagnosis not present

## 2019-05-21 DIAGNOSIS — L97929 Non-pressure chronic ulcer of unspecified part of left lower leg with unspecified severity: Secondary | ICD-10-CM | POA: Diagnosis not present

## 2019-05-21 DIAGNOSIS — R0989 Other specified symptoms and signs involving the circulatory and respiratory systems: Secondary | ICD-10-CM | POA: Diagnosis not present

## 2019-05-21 DIAGNOSIS — I739 Peripheral vascular disease, unspecified: Secondary | ICD-10-CM | POA: Diagnosis not present

## 2019-05-22 ENCOUNTER — Telehealth: Payer: Self-pay

## 2019-05-22 ENCOUNTER — Telehealth: Payer: Self-pay | Admitting: Adult Health

## 2019-05-22 NOTE — Telephone Encounter (Signed)
I spoke to pt and relayed that prescription were escribed to [facility] and he has both meds should be available when he needs them.  He verbalized understanding.

## 2019-05-22 NOTE — Telephone Encounter (Signed)
Pt called and left message on VM that he needed an appt.   Called patient and he said that he needed to be seen because he is having a lot of pain. Asked patient to describe to me what was going on. He said that he had an ingrown toenail and that he shut his foot in the door, catching that toe. He said that it seems to have gotten infected and the infection is spreading up his leg.   Advised patient that he would need to call his PCP for this as we do not deal with ingrown toenails. He said that they saw him yesterday and he thought that they were referring him to [place]. Advised him that we did not have a referral from his PCP and to call and ask if it was Vascular that they were referring him to or a podiatrist. Advised he go to the ER if he felt that he was having infection spread up his leg.   He states that he doesn't want to go where sick people are and he will call his PCP back.   York Cerise, CMA

## 2019-05-22 NOTE — Telephone Encounter (Signed)
Pt is requesting a refill of pregabalin (LYRICA) 100 MG capsule and diazepam (VALIUM) 5 MG tablet , to be sent to [place], [place]

## 2019-07-18 ENCOUNTER — Encounter: Payer: Self-pay | Admitting: Vascular Surgery

## 2019-07-18 ENCOUNTER — Other Ambulatory Visit: Payer: Self-pay

## 2019-07-18 ENCOUNTER — Ambulatory Visit (INDEPENDENT_AMBULATORY_CARE_PROVIDER_SITE_OTHER): Payer: Medicare HMO | Admitting: Vascular Surgery

## 2019-07-18 ENCOUNTER — Ambulatory Visit (HOSPITAL_COMMUNITY)
Admission: RE | Admit: 2019-07-18 | Discharge: 2019-07-18 | Disposition: A | Discharge status: Home / Self Care | Payer: Medicare HMO | Source: Ambulatory Visit | Attending: Surgery | Admitting: Surgery

## 2019-07-18 VITALS — BP 138/73 | HR 69 | Temp 98.0°F | Resp 20 | Ht 72.0 in | Wt 141.0 lb

## 2019-07-18 DIAGNOSIS — I739 Peripheral vascular disease, unspecified: Secondary | ICD-10-CM

## 2019-07-18 DIAGNOSIS — R6889 Other general symptoms and signs: Secondary | ICD-10-CM | POA: Diagnosis not present

## 2019-07-18 NOTE — Progress Notes (Addendum)
Patient name: Matthew [name] MRN: [id number] DOB: 09-15-1950 Sex: male  REASON FOR VISIT:   Follow-up of peripheral vascular disease.  HPI:   Matthew [name] is a pleasant 69 y.o. male who I saw in consultation on 02/15/2018 with peripheral vascular disease and a left carotid bruit.  Carotid duplex scan at that time showed no evidence of carotid disease.  Arterial Doppler studies at that time showed an ABI of 61% on the right and 84% on the left.  He had evidence of right iliac artery occlusive disease however he did not describe significant claudication or rest pain.  He had symptoms that were more likely consistent with neurogenic pain.  I planned on seeing him back in 1 year.  Since I saw him last he does continue to have some claudication in both calves although he also has some pain that occurs with standing and sitting and some of this may be related to lumbar disc disease.  Regardless, 2 months ago he scraped his left leg and this has not shown any signs of healing.  He has been doing local wound care with no significant improvement in the wound.  I do not get any clear-cut history of rest pain.  He denies any history of diabetes, hypertension, hypercholesterolemia, family history of premature cardiovascular disease or tobacco use.  He is on aspirin but is not on a statin.  Past Medical History:  Diagnosis Date  . Anxiety disorder   . Carpal tunnel syndrome    Bilateral  . Cervical spondylosis without myelopathy 05/07/2013  . Foot fracture    right foot  . Gait disorder 05/07/2013  . Hypertension   . Joint pain, knee   . Pain in joint, shoulder region   . Post traumatic stress disorder   . Restless leg syndrome   . Spinal stenosis in cervical region   . Spondylosis   . Stroke ([place])   . Syrinx of spinal cord ([place]) 03/30/2018   Thoracic    Family History  Problem Relation Age of Onset  . Cancer Brother 60       brain tumor  . Cancer - Lung Mother   . Diabetes Father     . Hypertension Other   . Stroke Other   . Diabetes Other     SOCIAL HISTORY: Social History   Tobacco Use  . Smoking status: Former Research scientist (life sciences)  . Smokeless tobacco: Never Used  . Tobacco comment: quit 8 years  ago  Substance Use Topics  . Alcohol use: No    Alcohol/week: 0.0 standard drinks    Comment: quit drinking 8 years ago    Allergies  Allergen Reactions  . Penicillins   . Codeine     Current Outpatient Medications  Medication Sig Dispense Refill  . amLODipine (NORVASC) 5 MG tablet     . ASPIRIN LOW DOSE 81 MG EC tablet   11  . cetirizine (ZYRTEC) 10 MG tablet   11  . diazepam (VALIUM) 5 MG tablet TAKE 1 TABLET BY MOUTH EVERY NIGHT AT BEDTIME AS NEEDED FOR MUSCLE SPASMS 90 tablet 1  . fluticasone (FLONASE) 50 MCG/ACT nasal spray   0  . pentoxifylline (TRENTAL) 400 MG CR tablet     . pregabalin (LYRICA) 100 MG capsule Take 1 capsule (100 mg total) by mouth daily. 90 capsule 1  . ibuprofen (ADVIL,MOTRIN) 600 MG tablet TAKE 1 TABLET BY MOUTH EVERY 8 HOURS AS NEEDED (Patient not taking: Reported on 03/30/2018) 270 tablet 0  No current facility-administered medications for this visit.    REVIEW OF SYSTEMS:  [X]  denotes positive finding, [ ]  denotes negative finding Cardiac  Comments:  Chest pain or chest pressure:    Shortness of breath upon exertion:    Short of breath when lying flat:    Irregular heart rhythm:        Vascular    Pain in calf, thigh, or hip brought on by ambulation: x   Pain in feet at night that wakes you up from your sleep:     Blood clot in your veins:    Leg swelling:  x  left foot      Pulmonary    Oxygen at home:    Productive cough:     Wheezing:         Neurologic    Sudden weakness in arms or legs:     Sudden numbness in arms or legs:     Sudden onset of difficulty speaking or slurred speech:    Temporary loss of vision in one eye:     Problems with dizziness:         Gastrointestinal    Blood in stool:     Vomited blood:          Genitourinary    Burning when urinating:     Blood in urine:        Psychiatric    Major depression:         Hematologic    Bleeding problems:    Problems with blood clotting too easily:        Skin    Rashes or ulcers:        Constitutional    Fever or chills:     PHYSICAL EXAM:   Vitals:   07/18/19 1336  BP: 138/73  Pulse: 69  Resp: 20  Temp: 98 F (36.7 C)  SpO2: 99%  Weight: 141 lb (64 kg)  Height: 6' (1.829 m)    GENERAL: The patient is a well-nourished male, in no acute distress. The vital signs are documented above. CARDIAC: There is a regular rate and rhythm.  VASCULAR: He has a left carotid bruit. He has a slightly diminished right femoral pulse with a normal left femoral pulse. I cannot palpate pedal pulses.  He has monophasic Doppler signals in both feet. He has mild swelling of the left foot. He has hyperpigmentation of both lower extremities but especially on the right side.    PULMONARY: There is good air exchange bilaterally without wheezing or rales. ABDOMEN: Soft and non-tender with normal pitched bowel sounds.  MUSCULOSKELETAL: There are no major deformities or cyanosis. NEUROLOGIC: No focal weakness or paresthesias are detected. SKIN: He has a pretibial wound on the left which measures 2 cm in width and 7 cm in length.  This wound is documented below.    PSYCHIATRIC: The patient has a normal affect.  DATA:    ARTERIAL DOPPLER STUDY: I have independently interpreted his arterial Doppler study today.  On the right side there is a monophasic dorsalis pedis and posterior tibial signal.  ABI is 58%.  Toe pressure is 88 mmHg.  Previous ABI on the right was 61%.  On the left side there is a monophasic dorsalis pedis and posterior tibial signal.  ABI is 52%.  Toe pressure is 0.  Previous ABI on the left was 84%.  MEDICAL ISSUES:   CRITICAL LIMB ISCHEMIA LEFT LOWER EXTREMITY: This patient has had a significant drop  in his ABIs on the  left.  He was 84% a year ago and is now 52% with a toe pressure of 0.  He now has an extensive wound on his left leg which has not shown any signs of healing over the last 2 months.  Thus I think this is clearly a limb threatening problem.  I have recommended that we proceed with arteriography in order to determine if he has any options for revascularization.  I think without revascularization he is at increased risk for limb loss. I have reviewed with the patient the indications for arteriography. In addition, I have reviewed the potential complications of arteriography including but not limited to: Bleeding, arterial injury, arterial thrombosis, dye action, renal insufficiency, or other unpredictable medical problems. I have explained to the patient that if we find disease amenable to angioplasty we could potentially address this at the same time. I have discussed the potential complications of angioplasty and stenting, including but not limited to: Bleeding, arterial thrombosis, arterial injury, dissection, or the need for surgical intervention.  Because of transportation issues his arteriogram has been scheduled for 07/27/2019.  CHRONIC VENOUS INSUFFICIENCY: He does have evidence of CEAP C4 venous disease.  Normally I would recommend elevation and compression however given his evidence of peripheral vascular disease for now we will simply follow this.  We will have more information after his arteriogram.  Waverly Ferrari [facility] [phone]

## 2019-07-20 ENCOUNTER — Other Ambulatory Visit: Payer: Self-pay

## 2019-07-25 ENCOUNTER — Inpatient Hospital Stay (HOSPITAL_COMMUNITY): Admission: RE | Admit: 2019-07-25 | Payer: Medicare HMO | Source: Ambulatory Visit

## 2019-07-27 ENCOUNTER — Ambulatory Visit (HOSPITAL_COMMUNITY): Admission: RE | Admit: 2019-07-27 | Payer: Medicare HMO | Source: Home / Self Care | Admitting: Vascular Surgery

## 2019-07-27 ENCOUNTER — Encounter (HOSPITAL_COMMUNITY): Admission: RE | Payer: Self-pay | Source: Home / Self Care

## 2019-07-27 SURGERY — ABDOMINAL AORTOGRAM W/LOWER EXTREMITY
Anesthesia: LOCAL | Laterality: Bilateral

## 2019-07-31 ENCOUNTER — Telehealth: Payer: Self-pay

## 2019-07-31 NOTE — Telephone Encounter (Signed)
Pt. Rescheduled for his PV lab procedure to 3/1. Offered him 2/26 procedure date, pt declined due to transportation. Pt verbalized understanding; no further questions/concerns at this time.

## 2019-08-04 ENCOUNTER — Other Ambulatory Visit (HOSPITAL_COMMUNITY)
Admission: RE | Admit: 2019-08-04 | Discharge: 2019-08-04 | Disposition: A | Discharge status: Home / Self Care | Payer: Medicare HMO | Source: Ambulatory Visit | Attending: Vascular Surgery | Admitting: Vascular Surgery

## 2019-08-04 DIAGNOSIS — Z20822 Contact with and (suspected) exposure to covid-19: Secondary | ICD-10-CM | POA: Diagnosis not present

## 2019-08-04 DIAGNOSIS — Z01812 Encounter for preprocedural laboratory examination: Secondary | ICD-10-CM | POA: Insufficient documentation

## 2019-08-04 LAB — SARS CORONAVIRUS 2 (TAT 6-24 HRS): SARS Coronavirus 2: NEGATIVE

## 2019-08-06 ENCOUNTER — Other Ambulatory Visit: Payer: Self-pay

## 2019-08-06 ENCOUNTER — Observation Stay (HOSPITAL_COMMUNITY)
Admission: RE | Admit: 2019-08-06 | Discharge: 2019-08-07 | Disposition: A | Discharge status: Home / Self Care | Payer: Medicare HMO | Attending: Vascular Surgery | Admitting: Vascular Surgery

## 2019-08-06 ENCOUNTER — Encounter (HOSPITAL_COMMUNITY): Payer: Self-pay | Admitting: Vascular Surgery

## 2019-08-06 ENCOUNTER — Encounter (HOSPITAL_COMMUNITY)
Admission: RE | Disposition: A | Discharge status: Home / Self Care | Payer: Self-pay | Source: Home / Self Care | Attending: Vascular Surgery

## 2019-08-06 DIAGNOSIS — G2581 Restless legs syndrome: Secondary | ICD-10-CM | POA: Insufficient documentation

## 2019-08-06 DIAGNOSIS — I70248 Atherosclerosis of native arteries of left leg with ulceration of other part of lower left leg: Secondary | ICD-10-CM | POA: Diagnosis not present

## 2019-08-06 DIAGNOSIS — Z7982 Long term (current) use of aspirin: Secondary | ICD-10-CM | POA: Insufficient documentation

## 2019-08-06 DIAGNOSIS — I998 Other disorder of circulatory system: Secondary | ICD-10-CM | POA: Diagnosis not present

## 2019-08-06 DIAGNOSIS — I10 Essential (primary) hypertension: Secondary | ICD-10-CM | POA: Insufficient documentation

## 2019-08-06 DIAGNOSIS — I714 Abdominal aortic aneurysm, without rupture: Secondary | ICD-10-CM | POA: Insufficient documentation

## 2019-08-06 DIAGNOSIS — Z880 Allergy status to penicillin: Secondary | ICD-10-CM | POA: Insufficient documentation

## 2019-08-06 DIAGNOSIS — Z823 Family history of stroke: Secondary | ICD-10-CM | POA: Diagnosis not present

## 2019-08-06 DIAGNOSIS — Z79899 Other long term (current) drug therapy: Secondary | ICD-10-CM | POA: Insufficient documentation

## 2019-08-06 DIAGNOSIS — Z885 Allergy status to narcotic agent status: Secondary | ICD-10-CM | POA: Diagnosis not present

## 2019-08-06 DIAGNOSIS — Z8673 Personal history of transient ischemic attack (TIA), and cerebral infarction without residual deficits: Secondary | ICD-10-CM | POA: Insufficient documentation

## 2019-08-06 DIAGNOSIS — I739 Peripheral vascular disease, unspecified: Secondary | ICD-10-CM | POA: Diagnosis present

## 2019-08-06 DIAGNOSIS — Z8249 Family history of ischemic heart disease and other diseases of the circulatory system: Secondary | ICD-10-CM | POA: Insufficient documentation

## 2019-08-06 DIAGNOSIS — L97829 Non-pressure chronic ulcer of other part of left lower leg with unspecified severity: Secondary | ICD-10-CM | POA: Diagnosis not present

## 2019-08-06 DIAGNOSIS — Z87891 Personal history of nicotine dependence: Secondary | ICD-10-CM | POA: Insufficient documentation

## 2019-08-06 HISTORY — PX: ABDOMINAL AORTAGRAM: SHX5706

## 2019-08-06 HISTORY — PX: ABDOMINAL AORTOGRAM W/LOWER EXTREMITY: CATH118223

## 2019-08-06 HISTORY — PX: ILIAC ARTERY STENT: SHX1786

## 2019-08-06 HISTORY — DX: Peripheral vascular disease, unspecified: I73.9

## 2019-08-06 LAB — CBC
HCT: 39.2 % (ref 39.0–52.0)
Hemoglobin: 13.5 g/dL (ref 13.0–17.0)
MCH: 30.1 pg (ref 26.0–34.0)
MCHC: 34.4 g/dL (ref 30.0–36.0)
MCV: 87.3 fL (ref 80.0–100.0)
Platelets: 193 10*3/uL (ref 150–400)
RBC: 4.49 MIL/uL (ref 4.22–5.81)
RDW: 12.7 % (ref 11.5–15.5)
WBC: 8 10*3/uL (ref 4.0–10.5)
nRBC: 0 % (ref 0.0–0.2)

## 2019-08-06 LAB — POCT I-STAT, CHEM 8
BUN: 15 mg/dL (ref 8–23)
Calcium, Ion: 1.44 mmol/L — ABNORMAL HIGH (ref 1.15–1.40)
Chloride: 100 mmol/L (ref 98–111)
Creatinine, Ser: 1.3 mg/dL — ABNORMAL HIGH (ref 0.61–1.24)
Glucose, Bld: 101 mg/dL — ABNORMAL HIGH (ref 70–99)
HCT: 45 % (ref 39.0–52.0)
Hemoglobin: 15.3 g/dL (ref 13.0–17.0)
Potassium: 3.3 mmol/L — ABNORMAL LOW (ref 3.5–5.1)
Sodium: 141 mmol/L (ref 135–145)
TCO2: 28 mmol/L (ref 22–32)

## 2019-08-06 LAB — GLUCOSE, CAPILLARY
Glucose-Capillary: 120 mg/dL — ABNORMAL HIGH (ref 70–99)
Glucose-Capillary: 158 mg/dL — ABNORMAL HIGH (ref 70–99)
Glucose-Capillary: 168 mg/dL — ABNORMAL HIGH (ref 70–99)

## 2019-08-06 LAB — CREATININE, SERUM
Creatinine, Ser: 1.36 mg/dL — ABNORMAL HIGH (ref 0.61–1.24)
GFR calc Af Amer: >60 mL/min (ref >60)
GFR calc non Af Amer: 52 mL/min — ABNORMAL LOW (ref >60)

## 2019-08-06 LAB — POCT ACTIVATED CLOTTING TIME
Activated Clotting Time: 241 seconds
Activated Clotting Time: 202 seconds
Activated Clotting Time: 180 seconds

## 2019-08-06 SURGERY — ABDOMINAL AORTOGRAM W/LOWER EXTREMITY
Anesthesia: LOCAL

## 2019-08-06 MED ORDER — LABETALOL HCL 5 MG/ML IV SOLN
INTRAVENOUS | Status: AC
  Filled 2019-08-06: qty 4

## 2019-08-06 MED ORDER — CLOPIDOGREL BISULFATE 75 MG PO TABS
ORAL_TABLET | ORAL | Status: DC | PRN
  Administered 2019-08-06: 300 mg via ORAL

## 2019-08-06 MED ORDER — SODIUM CHLORIDE 0.9 % IV SOLN: 250.0000 mL | INTRAVENOUS | Status: DC | PRN

## 2019-08-06 MED ORDER — LIDOCAINE HCL (PF) 1 % IJ SOLN
INTRAMUSCULAR | Status: AC
  Filled 2019-08-06: qty 30

## 2019-08-06 MED ORDER — ONDANSETRON HCL 4 MG/2ML IJ SOLN
INTRAMUSCULAR | Status: DC | PRN
  Administered 2019-08-06: 4 mg via INTRAVENOUS

## 2019-08-06 MED ORDER — ONDANSETRON HCL 4 MG/2ML IJ SOLN
INTRAMUSCULAR | Status: AC
  Filled 2019-08-06: qty 2

## 2019-08-06 MED ORDER — ROSUVASTATIN CALCIUM 10 MG PO TABS: 10.0000 mg | ORAL_TABLET | Freq: Every day | ORAL | 11 refills | Status: DC

## 2019-08-06 MED ORDER — LABETALOL HCL 5 MG/ML IV SOLN: 10.0000 mg | INTRAVENOUS | Status: DC | PRN

## 2019-08-06 MED ORDER — HEPARIN (PORCINE) IN NACL 1000-0.9 UT/500ML-% IV SOLN
INTRAVENOUS | Status: DC | PRN
  Administered 2019-08-06: 500 mL

## 2019-08-06 MED ORDER — SODIUM CHLORIDE 0.9 % IV SOLN: INTRAVENOUS | Status: DC

## 2019-08-06 MED ORDER — FENTANYL CITRATE (PF) 100 MCG/2ML IJ SOLN
INTRAMUSCULAR | Status: AC
  Filled 2019-08-06: qty 2

## 2019-08-06 MED ORDER — HYDROMORPHONE HCL 1 MG/ML IJ SOLN
1.0000 mg | Freq: Once | INTRAMUSCULAR | Status: AC
  Administered 2019-08-06: 1 mg via INTRAVENOUS

## 2019-08-06 MED ORDER — HYDRALAZINE HCL 20 MG/ML IJ SOLN
5.0000 mg | INTRAMUSCULAR | Status: AC | PRN
  Administered 2019-08-06 (×3): 5 mg via INTRAVENOUS

## 2019-08-06 MED ORDER — METHYLPREDNISOLONE SODIUM SUCC 125 MG IJ SOLR
INTRAMUSCULAR | Status: DC | PRN
  Administered 2019-08-06: 125 mg via INTRAVENOUS

## 2019-08-06 MED ORDER — CLOPIDOGREL BISULFATE 75 MG PO TABS
ORAL_TABLET | ORAL | Status: AC
  Filled 2019-08-06: qty 1

## 2019-08-06 MED ORDER — DIPHENHYDRAMINE HCL 50 MG/ML IJ SOLN
INTRAMUSCULAR | Status: AC
  Filled 2019-08-06: qty 1

## 2019-08-06 MED ORDER — CLOPIDOGREL BISULFATE 75 MG PO TABS
75.0000 mg | ORAL_TABLET | Freq: Every day | ORAL | Status: DC
  Administered 2019-08-07: 75 mg via ORAL
  Filled 2019-08-06: qty 1

## 2019-08-06 MED ORDER — SODIUM CHLORIDE 0.9% FLUSH: 3.0000 mL | INTRAVENOUS | Status: DC | PRN

## 2019-08-06 MED ORDER — HEPARIN SODIUM (PORCINE) 5000 UNIT/ML IJ SOLN
5000.0000 [IU] | Freq: Three times a day (TID) | INTRAMUSCULAR | Status: DC
  Administered 2019-08-07: 5000 [IU] via SUBCUTANEOUS
  Filled 2019-08-06: qty 1

## 2019-08-06 MED ORDER — ONDANSETRON HCL 4 MG/2ML IJ SOLN: 4.0000 mg | Freq: Four times a day (QID) | INTRAMUSCULAR | Status: DC | PRN

## 2019-08-06 MED ORDER — SODIUM CHLORIDE 0.9% FLUSH: 3.0000 mL | Freq: Two times a day (BID) | INTRAVENOUS | Status: DC

## 2019-08-06 MED ORDER — LABETALOL HCL 5 MG/ML IV SOLN
INTRAVENOUS | Status: DC | PRN
  Administered 2019-08-06: 10 mg via INTRAVENOUS

## 2019-08-06 MED ORDER — METHYLPREDNISOLONE SODIUM SUCC 125 MG IJ SOLR
INTRAMUSCULAR | Status: AC
  Filled 2019-08-06: qty 2

## 2019-08-06 MED ORDER — HYDRALAZINE HCL 20 MG/ML IJ SOLN: 5.0000 mg | INTRAMUSCULAR | Status: DC | PRN

## 2019-08-06 MED ORDER — IODIXANOL 320 MG/ML IV SOLN
INTRAVENOUS | Status: DC | PRN
  Administered 2019-08-06: 180 mL via INTRA_ARTERIAL

## 2019-08-06 MED ORDER — HEPARIN (PORCINE) IN NACL 1000-0.9 UT/500ML-% IV SOLN
INTRAVENOUS | Status: AC
  Filled 2019-08-06: qty 500

## 2019-08-06 MED ORDER — HEPARIN SODIUM (PORCINE) 1000 UNIT/ML IJ SOLN
INTRAMUSCULAR | Status: AC
  Filled 2019-08-06: qty 1

## 2019-08-06 MED ORDER — CLOPIDOGREL BISULFATE 75 MG PO TABS: 75.0000 mg | ORAL_TABLET | Freq: Every day | ORAL | Status: DC

## 2019-08-06 MED ORDER — ASPIRIN EC 81 MG PO TBEC: 81.0000 mg | DELAYED_RELEASE_TABLET | Freq: Every day | ORAL | Status: DC

## 2019-08-06 MED ORDER — LIDOCAINE HCL (PF) 1 % IJ SOLN
INTRAMUSCULAR | Status: DC | PRN
  Administered 2019-08-06: 15 mL
  Administered 2019-08-06: 20 mL

## 2019-08-06 MED ORDER — HEPARIN SODIUM (PORCINE) 1000 UNIT/ML IJ SOLN
INTRAMUSCULAR | Status: DC | PRN
  Administered 2019-08-06: 7000 [IU] via INTRAVENOUS

## 2019-08-06 MED ORDER — ROSUVASTATIN CALCIUM 5 MG PO TABS
10.0000 mg | ORAL_TABLET | Freq: Every day | ORAL | Status: DC
  Administered 2019-08-06: 10 mg via ORAL
  Filled 2019-08-06: qty 2

## 2019-08-06 MED ORDER — SODIUM CHLORIDE 0.9 % WEIGHT BASED INFUSION: 1.0000 mL/kg/h | INTRAVENOUS | Status: DC

## 2019-08-06 MED ORDER — ACETAMINOPHEN 325 MG PO TABS: 650.0000 mg | ORAL_TABLET | ORAL | Status: DC | PRN

## 2019-08-06 MED ORDER — ENSURE ENLIVE PO LIQD
237.0000 mL | Freq: Two times a day (BID) | ORAL | Status: DC
  Administered 2019-08-07: 237 mL via ORAL

## 2019-08-06 MED ORDER — CLOPIDOGREL BISULFATE 75 MG PO TABS: 75.0000 mg | ORAL_TABLET | Freq: Every day | ORAL | 11 refills | Status: DC

## 2019-08-06 MED ORDER — SODIUM CHLORIDE 0.9% FLUSH
3.0000 mL | Freq: Two times a day (BID) | INTRAVENOUS | Status: DC
  Administered 2019-08-06 – 2019-08-07 (×2): 3 mL via INTRAVENOUS

## 2019-08-06 MED ORDER — ONDANSETRON HCL 4 MG/2ML IJ SOLN: 4.0000 mg | Freq: Once | INTRAMUSCULAR | Status: DC

## 2019-08-06 MED ORDER — CLOPIDOGREL BISULFATE 75 MG PO TABS
300.0000 mg | ORAL_TABLET | Freq: Once | ORAL | Status: AC
  Administered 2019-08-06: 300 mg via ORAL
  Filled 2019-08-06: qty 4

## 2019-08-06 MED ORDER — OXYCODONE HCL 5 MG PO TABS: 5.0000 mg | ORAL_TABLET | ORAL | Status: DC | PRN

## 2019-08-06 MED ORDER — MIDAZOLAM HCL 2 MG/2ML IJ SOLN
INTRAMUSCULAR | Status: AC
  Filled 2019-08-06: qty 2

## 2019-08-06 MED ORDER — DIPHENHYDRAMINE HCL 50 MG/ML IJ SOLN
INTRAMUSCULAR | Status: DC | PRN
  Administered 2019-08-06: 50 mg via INTRAVENOUS

## 2019-08-06 MED ORDER — ROSUVASTATIN CALCIUM 5 MG PO TABS: 10.0000 mg | ORAL_TABLET | Freq: Every day | ORAL | Status: DC

## 2019-08-06 MED ORDER — HYDRALAZINE HCL 20 MG/ML IJ SOLN
INTRAMUSCULAR | Status: AC
  Filled 2019-08-06: qty 1

## 2019-08-06 MED ORDER — MIDAZOLAM HCL 2 MG/2ML IJ SOLN
INTRAMUSCULAR | Status: DC | PRN
  Administered 2019-08-06: 2 mg via INTRAVENOUS

## 2019-08-06 MED ORDER — HYDROMORPHONE HCL 1 MG/ML IJ SOLN: 0.5000 mg | INTRAMUSCULAR | Status: DC | PRN

## 2019-08-06 MED ORDER — FENTANYL CITRATE (PF) 100 MCG/2ML IJ SOLN
INTRAMUSCULAR | Status: DC | PRN
  Administered 2019-08-06 (×2): 25 ug via INTRAVENOUS
  Administered 2019-08-06: 50 ug via INTRAVENOUS
  Administered 2019-08-06 (×2): 25 ug via INTRAVENOUS

## 2019-08-06 MED ORDER — PREGABALIN 100 MG PO CAPS
100.0000 mg | ORAL_CAPSULE | Freq: Every day | ORAL | Status: DC
  Administered 2019-08-06 – 2019-08-07 (×2): 100 mg via ORAL
  Filled 2019-08-06 (×2): qty 1

## 2019-08-06 MED ORDER — HYDROMORPHONE HCL 1 MG/ML IJ SOLN
INTRAMUSCULAR | Status: AC
  Filled 2019-08-06: qty 1

## 2019-08-06 MED ORDER — HYDRALAZINE HCL 20 MG/ML IJ SOLN: 10.0000 mg | Freq: Once | INTRAMUSCULAR | Status: DC

## 2019-08-06 MED ORDER — CLOPIDOGREL BISULFATE 300 MG PO TABS
ORAL_TABLET | ORAL | Status: AC
  Filled 2019-08-06: qty 1

## 2019-08-06 MED ORDER — ASPIRIN EC 81 MG PO TBEC
81.0000 mg | DELAYED_RELEASE_TABLET | Freq: Every day | ORAL | Status: DC
  Administered 2019-08-06 – 2019-08-07 (×2): 81 mg via ORAL
  Filled 2019-08-06 (×2): qty 1

## 2019-08-06 MED ORDER — SODIUM CHLORIDE 0.9 % IV SOLN: INTRAVENOUS | Status: AC

## 2019-08-06 MED ORDER — ONDANSETRON HCL 4 MG/2ML IJ SOLN
4.0000 mg | Freq: Four times a day (QID) | INTRAMUSCULAR | Status: DC | PRN
  Administered 2019-08-06: 4 mg via INTRAVENOUS

## 2019-08-06 SURGICAL SUPPLY — 22 items
BALLN MUSTANG 5X100X135 (BALLOONS) ×2
BALLN MUSTANG 9X20X75 (BALLOONS) ×4
BALLOON MUSTANG 5X100X135 (BALLOONS) ×1 IMPLANT
BALLOON MUSTANG 9X20X75 (BALLOONS) ×2 IMPLANT
CATH OMNI FLUSH 5F 65CM (CATHETERS) ×2 IMPLANT
GLIDEWIRE ADV .035X260CM (WIRE) ×2 IMPLANT
KIT ENCORE 26 ADVANTAGE (KITS) ×4 IMPLANT
KIT MICROPUNCTURE NIT STIFF (SHEATH) ×2 IMPLANT
KIT PV (KITS) ×2 IMPLANT
SHEATH BRITE TIP 7FR 35CM (SHEATH) ×4 IMPLANT
SHEATH PINNACLE 5F 10CM (SHEATH) ×2 IMPLANT
SHEATH PINNACLE ST 6F 45CM (SHEATH) ×2 IMPLANT
SHEATH PROBE COVER 6X72 (BAG) ×4 IMPLANT
STENT TIGRIS 5X100X120 (Permanent Stent) ×2 IMPLANT
STENT TIGRIS 5X60X120 (Permanent Stent) ×2 IMPLANT
STENT VIABAHN 7X29X80 VBX (Permanent Stent) ×2 IMPLANT
STENT VIABAHNBX 7X29X135 (Permanent Stent) ×2 IMPLANT
SYR MEDRAD MARK 7 150ML (SYRINGE) ×2 IMPLANT
TRANSDUCER W/STOPCOCK (MISCELLANEOUS) ×2 IMPLANT
TRAY PV CATH (CUSTOM PROCEDURE TRAY) ×2 IMPLANT
WIRE BENTSON .035X145CM (WIRE) ×2 IMPLANT
WIRE ROSEN-J .035X180CM (WIRE) ×2 IMPLANT

## 2019-08-06 NOTE — Progress Notes (Signed)
Patient got very diaphoretic, hot, then threw up gold yellow emesis. Dr. Randie Heinz made aware. CBG 120. Zofran given. Treating high BP

## 2019-08-06 NOTE — Progress Notes (Signed)
Site area: rt groin fa sheath pulled by Jannet Mantis; left groin fa sheath pulled by Lauro Regulus Site Prior to Removal:  Level 0 Pressure Applied For: 25 minutes each groin Manual:   Yes  Patient Status During Pull:  stable Post Pull Site:  Level 0 Post Pull Instructions Given:  yes Post Pull Pulses Present: bilateral PT dopplered Dressing Applied:  Gauze and tegaderm Bedrest begins @ 1415 Comments:

## 2019-08-06 NOTE — Op Note (Signed)
Patient name: Matthew [name] MRN: [id number] DOB: Jun 19, 1950 Sex: male  08/06/2019 Pre-operative Diagnosis: Critical left lower extremity ischemia Post-operative diagnosis:  Same Surgeon:  Erlene Quan C. Donzetta Matters, MD Procedure Performed: 1.  Ultrasound-guided cannulation right common femoral artery 2.  Aortogram with bilateral lower extremity runoff 3.  Stent of left SFA with 5 x 100 mm Tigris and 5 x 60 mm Tigris 4.  Ultrasound-guided cannulation left common femoral artery 5.  Kissing iliac stents of bilateral common iliac arteries with 7 x 29 mm VBX postdilated to 9 mm 6.  Moderate sedation with fentanyl and Versed for 78 minutes  Indications: 69 year old male with history of left lower extremity wound.  Has a toe pressure of 0 on the left.  Does not have a right common femoral pulse on the left side is 1+.  He is indicated for angiogram possible intervention focusing on the left.  Findings: He has abdominal aortic aneurysm we measured to be approximately 4 cm.  Bilateral common iliac arteries are 90% stenosed for approximately 1 cm each.  Common femoral arteries were free of disease.  Right side SFA appears patent as this popliteal artery with dominant runoff via the posterior tibial artery.  Left side SFA is patent up until 10 cm proximal to the adductor canal where it becomes stenotic and then has a proximal 95% stenosis for 15 centimeters.  Dominant runoff is via posterior tibial artery.  Given the tight stenosis in the common iliac arteries I elected to primarily stent with Tigris stents postdilated with 5 mm balloons.  Completion was 0% residual stenosis where previously was subtotally occluded at least 95% stenosed.  Common iliac arteries were stented with 7 mm stents postdilated with 9 mm.  Where previously they were 90% stenosed there was 0% residual stenosis.  Patient does have approximately 4 cm aneurysm will need to have this followed as an outpatient we will get aortoiliac duplex and  ABIs.   Procedure:  The patient was identified in the holding area and taken to room 8.  The patient was then placed supine on the table and prepped and draped in the usual sterile fashion.  A time out was called.  Ultrasound was used to evaluate the right common femoral artery.  This was noted to be patent compressible although did not have any pulsatility.  The area was anesthetized 1% lidocaine cannulated with micropuncture needle followed by wire sheath.  Images saved department record.  Bentson wire was placed followed by 5 [place] sheath.  We run across the stenotic lesion placed on the catheter level of L1 performed aortogram.  There is an obvious aneurysm with very tight stenosis bilateral iliac arteries.  At this time the patient had a contrast reaction he was treated with Benadryl and Solu-Medrol.  We then crossed the bifurcation with Glidewire advantage and Omni catheter.  We placed a long 6 French sheath in the left SFA.  Patient was fully heparinized at this time we crossed the SFA lesion with Glidewire advantage.  We confirmed intraluminal access.  We first balloon dilated with 5 mm balloon.  We then placed first a 5 x 100 mm Tigris followed 5 x 60 Tigers.  These were postdilated with the same 5 mm balloon.  Previously there was multilevel 90% stenosis for approximately 15 cm there is now 0% residual stenosis.  Satisfied we retracted our sheath.  We placed the wire into the aorta exchanged for a long bright tip 7 French sheath.  We then  used ultrasound guidance to cannulate the left common femoral artery with direct ultrasound visualization.  Images saved the permanent record and artery was noted to be patent and compressible free of disease.  We placed a Bentson wire followed by a long 7 French sheath that was bright to.  Glidewire advantage was placed into the aorta we advanced the sheath over the dilator into the aorta.  We brought to 7 x 29 mm VBX stents to the level of the common leg arteries.   Retrograde angiography was performed and the stents were deployed.  Completion angiography did not demonstrate any issues however the stents did not appear fully deployed.  We postdilated with 9 mm balloons.  Completion demonstrated full dilatation of the stents.  There were palpable common femoral pulses.  Satisfied we removed the catheters and wires.  She still be pulled in postoperative holding.  He tolerated procedure without immediate complication.   Contrast: 180cc   Anastaisa Wooding C. Randie Heinz, MD [facility] Office: [phone] Pager: [phone]

## 2019-08-06 NOTE — H&P (Signed)
   History and Physical Update  The patient was interviewed and re-examined.  The patient's previous History and Physical has been reviewed and is unchanged from recent office visit. Plan for aortogram with possible intervention.   Xzavien Harada C. Randie Heinz, MD [facility] Office: [phone] Pager: [phone]  08/06/2019, 9:19 AM

## 2019-08-07 DIAGNOSIS — Z7982 Long term (current) use of aspirin: Secondary | ICD-10-CM | POA: Diagnosis not present

## 2019-08-07 DIAGNOSIS — L97829 Non-pressure chronic ulcer of other part of left lower leg with unspecified severity: Secondary | ICD-10-CM | POA: Diagnosis not present

## 2019-08-07 DIAGNOSIS — Z87891 Personal history of nicotine dependence: Secondary | ICD-10-CM | POA: Diagnosis not present

## 2019-08-07 DIAGNOSIS — I70248 Atherosclerosis of native arteries of left leg with ulceration of other part of lower left leg: Secondary | ICD-10-CM | POA: Diagnosis not present

## 2019-08-07 DIAGNOSIS — I10 Essential (primary) hypertension: Secondary | ICD-10-CM | POA: Diagnosis not present

## 2019-08-07 DIAGNOSIS — Z885 Allergy status to narcotic agent status: Secondary | ICD-10-CM | POA: Diagnosis not present

## 2019-08-07 DIAGNOSIS — I714 Abdominal aortic aneurysm, without rupture: Secondary | ICD-10-CM | POA: Diagnosis not present

## 2019-08-07 DIAGNOSIS — Z880 Allergy status to penicillin: Secondary | ICD-10-CM | POA: Diagnosis not present

## 2019-08-07 DIAGNOSIS — G2581 Restless legs syndrome: Secondary | ICD-10-CM | POA: Diagnosis not present

## 2019-08-07 LAB — BASIC METABOLIC PANEL
Anion gap: 8 (ref 5–15)
BUN: 16 mg/dL (ref 8–23)
CO2: 25 mmol/L (ref 22–32)
Calcium: 10.3 mg/dL (ref 8.9–10.3)
Chloride: 106 mmol/L (ref 98–111)
Creatinine, Ser: 1.4 mg/dL — ABNORMAL HIGH (ref 0.61–1.24)
GFR calc Af Amer: 59 mL/min — ABNORMAL LOW (ref >60)
GFR calc non Af Amer: 51 mL/min — ABNORMAL LOW (ref >60)
Glucose, Bld: 116 mg/dL — ABNORMAL HIGH (ref 70–99)
Potassium: 4 mmol/L (ref 3.5–5.1)
Sodium: 139 mmol/L (ref 135–145)

## 2019-08-07 LAB — CBC
HCT: 37.9 % — ABNORMAL LOW (ref 39.0–52.0)
Hemoglobin: 13.1 g/dL (ref 13.0–17.0)
MCH: 30.1 pg (ref 26.0–34.0)
MCHC: 34.6 g/dL (ref 30.0–36.0)
MCV: 87.1 fL (ref 80.0–100.0)
Platelets: 187 10*3/uL (ref 150–400)
RBC: 4.35 MIL/uL (ref 4.22–5.81)
RDW: 12.7 % (ref 11.5–15.5)
WBC: 11.8 10*3/uL — ABNORMAL HIGH (ref 4.0–10.5)
nRBC: 0 % (ref 0.0–0.2)

## 2019-08-07 LAB — GLUCOSE, CAPILLARY
Glucose-Capillary: 99 mg/dL (ref 70–99)
Glucose-Capillary: 101 mg/dL — ABNORMAL HIGH (ref 70–99)

## 2019-08-07 MED ORDER — ASPIRIN 81 MG PO TBEC: 81.0000 mg | DELAYED_RELEASE_TABLET | Freq: Every day | ORAL | Status: DC

## 2019-08-07 NOTE — Plan of Care (Signed)
  Problem: Education: Goal: Knowledge of General Education information will improve Description Including pain rating scale, medication(s)/side effects and non-pharmacologic comfort measures Outcome: Progressing   

## 2019-08-07 NOTE — Discharge Instructions (Signed)
° °  [facility] ° °Discharge Instructions ° °Lower Extremity Angiogram; Angioplasty/Stenting ° °Please refer to the following instructions for your post-procedure care. Your surgeon or physician assistant will discuss any changes with you. ° °Activity ° °Avoid lifting more than 8 pounds (1 gallons of milk) for 72 hours (3 days) after your procedure. You may walk as much as you can tolerate. It's OK to drive after 72 hours. ° °Bathing/Showering ° °You may shower the day after your procedure. If you have a bandage, you may remove it at 24- 48 hours. Clean your incision site with mild soap and water. Pat the area dry with a clean towel. ° °Diet ° °Resume your pre-procedure diet. There are no special food restrictions following this procedure. All patients with peripheral vascular disease should follow a low fat/low cholesterol diet. In order to heal from your surgery, it is CRITICAL to get adequate nutrition. Your body requires vitamins, minerals, and protein. Vegetables are the best source of vitamins and minerals. Vegetables also provide the perfect balance of protein. Processed food has little nutritional value, so try to avoid this. ° °Medications ° °Resume taking all of your medications unless your doctor tells you not to. If your incision is causing pain, you may take over-the-counter pain relievers such as acetaminophen (Tylenol) ° °Follow Up ° °Follow up will be arranged at the time of your procedure. You may have an office visit scheduled or may be scheduled for surgery. Ask your surgeon if you have any questions. ° °Please call us immediately for any of the following conditions: °•Severe or worsening pain your legs or feet at rest or with walking. °•Increased pain, redness, drainage at your groin puncture site. °•Fever of 101 degrees or higher. °•If you have any mild or slow bleeding from your puncture site: lie down, apply firm constant pressure over the area with a piece of  gauze or a clean wash cloth for 30 minutes- no peeking!, call 911 right away if you are still bleeding after 30 minutes, or if the bleeding is heavy and unmanageable. ° °Reduce your risk factors of vascular disease: ° °Stop smoking. If you would like help call [facility] at 1-800-QUIT-NOW (1-800-784-8669) or [facility] at 336-586-4000. °Manage your cholesterol °Maintain a desired weight °Control your diabetes °Keep your blood pressure down ° °If you have any questions, please call the office at [phone] ° °

## 2019-08-07 NOTE — Care Management (Signed)
08-07-19 Patient stated he did not have transportation home- Case Manager set up [employer] for transportation. Patient signed waiver and information faxed to the transportation center. [employer] to pick up patient at 1:15. Patient has someone to pick up medications. No further needs from Case Manager at this time. Graves-Bigelow, Lamar Laundry, RN, BSN Case Manager

## 2019-08-07 NOTE — Progress Notes (Addendum)
[facility]  Subjective  - States he fills a lot better and his feet are warmer.   Objective (!) 154/54 (!) 52 98.4 F (36.9 C) (Oral) 17 96%  Intake/Output Summary (Last 24 hours) at 08/07/2019 0929 Last data filed at 08/07/2019 0840 Gross per 24 hour  Intake 543.5 ml  Output 475 ml  Net 68.5 ml    Palpable DP B LE B groins soft without hematoma Lungs non labored breathing No N/V  Assessment/Planning: POD # 1 Stent of left SFA with 5 x 100 mm Tigris and 5 x 60 mm Tigris Kissing iliac stents of bilateral common iliac arteries with 7 x 29 mm VBX postdilated to 9 mm  Patent arterial LE blood flow with palpable DP pulses  Denise N/V today  Asprin and Plavix, Crestor at discharge.  Patient does have approximately 4 cm aneurysm will need to have this followed as an outpatient we will get aortoiliac duplex and ABIs.  Mosetta Pigeon 08/07/2019 9:29 AM --  Laboratory Lab Results: Recent Labs    08/06/19 1858 08/07/19 0321  WBC 8.0 11.8*  HGB 13.5 13.1  HCT 39.2 37.9*  PLT 193 187   BMET Recent Labs    08/06/19 0900 08/06/19 0900 08/06/19 1858 08/07/19 0321  NA 141  --   --  139  K 3.3*  --   --  4.0  CL 100  --   --  106  CO2  --   --   --  25  GLUCOSE 101*  --   --  116*  BUN 15  --   --  16  CREATININE 1.30*   < > 1.36* 1.40*  CALCIUM  --   --   --  10.3   < > = values in this interval not displayed.    COAG No results found for: INR, PROTIME No results found for: PTT   I have independently interviewed and examined patient and agree with PA assessment and plan above.  We will get office to refer to wound center.  We will follow up with aortoiliac duplex and can evaluate the size of his aneurysm.  He has palpable pedal pulses hopefully this will get his wound to heal.  Sueanne Maniaci C. Randie Heinz, MD [facility] Office: [phone] Pager: [phone]

## 2019-08-07 NOTE — Discharge Summary (Signed)
  Discharge Summary  Patient ID: Matthew [name] [id number] 69 y.o. 1951/04/20  Admit date: 08/06/2019  Discharge date and time: 08/07/19   Admitting Physician: Maeola Harman, MD   Discharge Physician: same  Admission Diagnoses: PAD (peripheral artery disease) [facility]) [I73.9]  Discharge Diagnoses: same  Admission Condition: fair  Discharged Condition: fair  Indication for Admission: critical left lower extremity ischemia  Hospital Course: Matthew [name] is a 69 year old male who is brought in as an outpatient due to critical limb ischemia of left lower extremity with wound for aortogram.  He underwent left SFA stenting and bilateral common iliac artery stents by Dr. Randie Heinz on 08/06/2019.  He was kept overnight for observation.  POD #1 bilateral lower extremities well perfused and groins are without palpable hematoma.  He will follow-up in office with ABIs and aortoiliac duplex.  Should also be noted that on angiography a 4 cm abdominal aortic aneurysm was seen.  He will be discharged on aspirin, Plavix, statin.  Discharge instructions were reviewed.  He will be discharged home this morning in stable condition.  Consults: None  Treatments: surgery: Aortogram with left SFA stenting and kissing bilateral common iliac stents by Dr. Randie Heinz on 08/06/2019  Discharge Exam: See progress note 08/07/2019 Vitals:   08/07/19 0331 08/07/19 0742  BP: (!) 160/50 (!) 154/54  Pulse: (!) 52 (!) 52  Resp: 15 17  Temp: 98.5 F (36.9 C) 98.4 F (36.9 C)  SpO2: 98% 96%     Disposition: Discharge disposition: 01-Home or Self Care       Patient Instructions:  Allergies as of 08/07/2019      Reactions   Contrast Media [iodinated Diagnostic Agents] Hives, Shortness Of Breath, Nausea And Vomiting   Penicillins Itching   Did it involve swelling of the face/tongue/throat, SOB, or low BP? No Did it involve sudden or severe rash/hives, skin peeling, or any reaction on the inside of your mouth  or nose? No Did you need to seek medical attention at a hospital or doctor's office? No When did it last happen?30 years If all above answers are "NO", may proceed with cephalosporin use.   Codeine Itching      Medication List    TAKE these medications   aspirin 81 MG EC tablet Take 1 tablet (81 mg total) by mouth daily. Start taking on: August 08, 2019   clopidogrel 75 MG tablet Commonly known as: Plavix Take 1 tablet (75 mg total) by mouth daily.   diazepam 5 MG tablet Commonly known as: VALIUM TAKE 1 TABLET BY MOUTH EVERY NIGHT AT BEDTIME AS NEEDED FOR MUSCLE SPASMS What changed:   how much to take  how to take this  when to take this  additional instructions   ibuprofen 600 MG tablet Commonly known as: ADVIL TAKE 1 TABLET BY MOUTH EVERY 8 HOURS AS NEEDED   pregabalin 100 MG capsule Commonly known as: Lyrica Take 1 capsule (100 mg total) by mouth daily. What changed: when to take this   rosuvastatin 10 MG tablet Commonly known as: Crestor Take 1 tablet (10 mg total) by mouth at bedtime.      Activity: activity as tolerated Diet: regular diet Wound Care: keep wound clean and dry  Follow-up with Dr. Randie Heinz in 4 weeks.  Signed: Emilie Rutter, PA-C 08/07/2019 9:44 AM [facility] Office: [phone]

## 2019-08-21 ENCOUNTER — Encounter (HOSPITAL_COMMUNITY): Payer: Self-pay | Admitting: Emergency Medicine

## 2019-08-21 ENCOUNTER — Ambulatory Visit (HOSPITAL_BASED_OUTPATIENT_CLINIC_OR_DEPARTMENT_OTHER): Payer: Medicare HMO

## 2019-08-21 ENCOUNTER — Emergency Department (HOSPITAL_COMMUNITY)
Admission: EM | Admit: 2019-08-21 | Discharge: 2019-08-21 | Disposition: A | Discharge status: Home / Self Care | Payer: Medicare HMO | Attending: Emergency Medicine | Admitting: Emergency Medicine

## 2019-08-21 ENCOUNTER — Other Ambulatory Visit: Payer: Self-pay

## 2019-08-21 DIAGNOSIS — Z7982 Long term (current) use of aspirin: Secondary | ICD-10-CM | POA: Diagnosis not present

## 2019-08-21 DIAGNOSIS — L0390 Cellulitis, unspecified: Secondary | ICD-10-CM

## 2019-08-21 DIAGNOSIS — L538 Other specified erythematous conditions: Secondary | ICD-10-CM

## 2019-08-21 DIAGNOSIS — R2242 Localized swelling, mass and lump, left lower limb: Secondary | ICD-10-CM | POA: Diagnosis not present

## 2019-08-21 DIAGNOSIS — I10 Essential (primary) hypertension: Secondary | ICD-10-CM | POA: Diagnosis not present

## 2019-08-21 DIAGNOSIS — R609 Edema, unspecified: Secondary | ICD-10-CM

## 2019-08-21 DIAGNOSIS — R202 Paresthesia of skin: Secondary | ICD-10-CM | POA: Insufficient documentation

## 2019-08-21 DIAGNOSIS — Z79899 Other long term (current) drug therapy: Secondary | ICD-10-CM | POA: Insufficient documentation

## 2019-08-21 DIAGNOSIS — R52 Pain, unspecified: Secondary | ICD-10-CM

## 2019-08-21 DIAGNOSIS — L03116 Cellulitis of left lower limb: Secondary | ICD-10-CM | POA: Insufficient documentation

## 2019-08-21 DIAGNOSIS — R200 Anesthesia of skin: Secondary | ICD-10-CM | POA: Insufficient documentation

## 2019-08-21 DIAGNOSIS — Z8673 Personal history of transient ischemic attack (TIA), and cerebral infarction without residual deficits: Secondary | ICD-10-CM | POA: Diagnosis not present

## 2019-08-21 DIAGNOSIS — L089 Local infection of the skin and subcutaneous tissue, unspecified: Secondary | ICD-10-CM | POA: Diagnosis present

## 2019-08-21 DIAGNOSIS — Z87891 Personal history of nicotine dependence: Secondary | ICD-10-CM | POA: Diagnosis not present

## 2019-08-21 LAB — CBC WITH DIFFERENTIAL/PLATELET
Abs Immature Granulocytes: 0.08 10*3/uL — ABNORMAL HIGH (ref 0.00–0.07)
Basophils Absolute: 0.1 10*3/uL (ref 0.0–0.1)
Basophils Relative: 0 %
Eosinophils Absolute: 0.4 10*3/uL (ref 0.0–0.5)
Eosinophils Relative: 3 %
HCT: 45 % (ref 39.0–52.0)
Hemoglobin: 14.7 g/dL (ref 13.0–17.0)
Immature Granulocytes: 1 %
Lymphocytes Relative: 25 %
Lymphs Abs: 3.4 10*3/uL (ref 0.7–4.0)
MCH: 29.5 pg (ref 26.0–34.0)
MCHC: 32.7 g/dL (ref 30.0–36.0)
MCV: 90.4 fL (ref 80.0–100.0)
Monocytes Absolute: 0.8 10*3/uL (ref 0.1–1.0)
Monocytes Relative: 6 %
Neutro Abs: 8.9 10*3/uL — ABNORMAL HIGH (ref 1.7–7.7)
Neutrophils Relative %: 65 %
Platelets: 338 10*3/uL (ref 150–400)
RBC: 4.98 MIL/uL (ref 4.22–5.81)
RDW: 12.6 % (ref 11.5–15.5)
WBC: 13.5 10*3/uL — ABNORMAL HIGH (ref 4.0–10.5)
nRBC: 0 % (ref 0.0–0.2)

## 2019-08-21 LAB — BASIC METABOLIC PANEL
Anion gap: 10 (ref 5–15)
BUN: 16 mg/dL (ref 8–23)
CO2: 28 mmol/L (ref 22–32)
Calcium: 10.6 mg/dL — ABNORMAL HIGH (ref 8.9–10.3)
Chloride: 100 mmol/L (ref 98–111)
Creatinine, Ser: 1.51 mg/dL — ABNORMAL HIGH (ref 0.61–1.24)
GFR calc Af Amer: 53 mL/min — ABNORMAL LOW (ref >60)
GFR calc non Af Amer: 46 mL/min — ABNORMAL LOW (ref >60)
Glucose, Bld: 119 mg/dL — ABNORMAL HIGH (ref 70–99)
Potassium: 4.2 mmol/L (ref 3.5–5.1)
Sodium: 138 mmol/L (ref 135–145)

## 2019-08-21 LAB — LACTIC ACID, PLASMA
Lactic Acid, Venous: 2.1 mmol/L (ref 0.5–1.9)
Lactic Acid, Venous: 1.1 mmol/L (ref 0.5–1.9)

## 2019-08-21 MED ORDER — SODIUM CHLORIDE 0.9 % IV BOLUS
1000.0000 mL | Freq: Once | INTRAVENOUS | Status: AC
  Administered 2019-08-21: 1000 mL via INTRAVENOUS

## 2019-08-21 MED ORDER — SODIUM CHLORIDE 0.9 % IV SOLN
1.0000 g | Freq: Once | INTRAVENOUS | Status: AC
  Administered 2019-08-21: 1 g via INTRAVENOUS
  Filled 2019-08-21: qty 10

## 2019-08-21 MED ORDER — ACETAMINOPHEN 325 MG PO TABS
650.0000 mg | ORAL_TABLET | Freq: Once | ORAL | Status: AC
  Administered 2019-08-21: 650 mg via ORAL
  Filled 2019-08-21: qty 2

## 2019-08-21 MED ORDER — CEPHALEXIN 500 MG PO CAPS: 500.0000 mg | ORAL_CAPSULE | Freq: Four times a day (QID) | ORAL | 0 refills | Status: AC

## 2019-08-21 NOTE — ED Notes (Signed)
Matthew [name], [name] lactic acid 2.1

## 2019-08-21 NOTE — Progress Notes (Signed)
Left lower extremity venous duplex complete.  Preliminary results given to Dr. Particia Nearing @ 18:40.  Please see CV Proc tab for preliminary results, Levin Bacon- RDMS, RVT 6:41 PM  08/21/2019

## 2019-08-21 NOTE — Discharge Instructions (Addendum)
As discussed, your labs were reassuring today. You were given antibiotics here in the ED. Your blood clot study was negative. I have placed a referral for wound care. They will call you within the next week to schedule an appointment. I am also sending you home with more antibiotics. Take as prescribed and finish all antibiotics. Follow-up with PCP within the next week for further evaluation. Return to the ER for new or worsening symptoms.

## 2019-08-21 NOTE — ED Notes (Signed)
ED Provider at bedside. 

## 2019-08-21 NOTE — ED Triage Notes (Signed)
Pt c/o left lower leg infection that has been ongoing for while. Reports got out of hospital last week but not currently on antibiotics.

## 2019-08-21 NOTE — ED Provider Notes (Signed)
[facility]-EMERGENCY DEPT Provider Note   CSN: [id number] Arrival date & time: 08/21/19  1152     History Chief Complaint  Patient presents with  . leg infection    Matthew [name] is a 69 y.o. male with a past medical history significant for anxiety, PAD status post left SFA stenting and bilateral common iliac artery stents on 08/06/2019, restless leg syndrome, history of stroke, hypertension, sprinx of spinal cord, and gait disorder who presents to the ED for evaluation of worsening left lower extremity wound that is still present for the past year.  Patient states he has been seen numerous times by different providers and has never been offered anything for his wound.  He notes he has never been on any antibiotics.  Left lower extremity wound associated with left foot edema that is worsened after his stent placement.  Patient was recently discharged from the hospital on 08/07/2019 after lower extremity stent placement.  Left lower extremity edema associated with numbness/tingling and severe pain.  Patient denies fever and chills.  He has used Vaseline over the entire wound with no relief. Denies drainage from site.  Denies shortness of breath and chest pain.  Denies history of blood clots, recent long immobilizations, and hormonal treatment; however, he did have the stent placement on 08/06/19.  History obtained from patient and past medical records. No interpreter used during encounter.      Past Medical History:  Diagnosis Date  . Anxiety disorder   . Carpal tunnel syndrome    Bilateral  . Cervical spondylosis without myelopathy 05/07/2013  . Foot fracture    right foot  . Gait disorder 05/07/2013  . Hypertension   . Joint pain, knee   . PAD (peripheral artery disease) (HCC)   . Pain in joint, shoulder region   . Post traumatic stress disorder   . Restless leg syndrome   . Spinal stenosis in cervical region   . Spondylosis   . Stroke (HCC)   . Syrinx of spinal  cord (HCC) 03/30/2018   Thoracic    Patient Active Problem List   Diagnosis Date Noted  . PAD (peripheral artery disease) (HCC) 08/06/2019  . Syrinx of spinal cord (HCC) 03/30/2018  . Carpal tunnel syndrome 02/06/2014  . Cervical spondylosis without myelopathy 05/07/2013  . Gait disorder 05/07/2013    Past Surgical History:  Procedure Laterality Date  . ABDOMINAL AORTAGRAM  08/06/2019   ABDOMINAL AORTOGRAM W/LOWER EXTREMITY  . ABDOMINAL AORTOGRAM W/LOWER EXTREMITY N/A 08/06/2019   Procedure: ABDOMINAL AORTOGRAM W/LOWER EXTREMITY;  Surgeon: Maeola Harman, MD;  Location: [facility] INVASIVE CV LAB;  Service: Cardiovascular;  Laterality: N/A;  . KNEE SURGERY  1980  . NECK SURGERY     2009 or 2010       Family History  Problem Relation Age of Onset  . Cancer Brother 60       brain tumor  . Cancer - Lung Mother   . Diabetes Father   . Hypertension Other   . Stroke Other   . Diabetes Other     Social History   Tobacco Use  . Smoking status: Former Games developer  . Smokeless tobacco: Never Used  . Tobacco comment: quit 8 years  ago  Substance Use Topics  . Alcohol use: No    Alcohol/week: 0.0 standard drinks    Comment: quit drinking 8 years ago  . Drug use: No    Home Medications Prior to Admission medications   Medication Sig Start  Date End Date Taking? Authorizing Provider  aspirin EC 81 MG EC tablet Take 1 tablet (81 mg total) by mouth daily. 08/08/19  Yes Emilie Rutter, PA-C  clopidogrel (PLAVIX) 75 MG tablet Take 1 tablet (75 mg total) by mouth daily. 08/06/19 08/05/20 Yes Maeola Harman, MD  diazepam (VALIUM) 5 MG tablet TAKE 1 TABLET BY MOUTH EVERY NIGHT AT BEDTIME AS NEEDED FOR MUSCLE SPASMS Patient taking differently: Take 5 mg by mouth at bedtime. NEEDED FOR MUSCLE SPASMS 05/15/19  Yes Butch Penny, NP  pregabalin (LYRICA) 100 MG capsule Take 1 capsule (100 mg total) by mouth daily. Patient taking differently: Take 100 mg by mouth at bedtime.   05/15/19  Yes Butch Penny, NP  rosuvastatin (CRESTOR) 10 MG tablet Take 1 tablet (10 mg total) by mouth at bedtime. 08/06/19 08/05/20 Yes Maeola Harman, MD  cephALEXin (KEFLEX) 500 MG capsule Take 1 capsule (500 mg total) by mouth 4 (four) times daily for 7 days. 08/21/19 08/28/19  Mannie Stabile, PA-C  ibuprofen (ADVIL,MOTRIN) 600 MG tablet TAKE 1 TABLET BY MOUTH EVERY 8 HOURS AS NEEDED Patient not taking: Reported on 03/30/2018    York Spaniel, MD    Allergies    Contrast media [iodinated diagnostic agents], Penicillins, and Codeine  Review of Systems   Review of Systems  Constitutional: Negative for chills and fever.  Respiratory: Negative for shortness of breath.   Cardiovascular: Positive for leg swelling (LLE). Negative for chest pain.  Musculoskeletal: Positive for arthralgias.  Skin: Positive for color change and wound.  Neurological: Positive for numbness.  All other systems reviewed and are negative.   Physical Exam Updated Vital Signs BP 133/74   Pulse 84   Temp 98 F (36.7 C) (Oral)   Resp 18   SpO2 99%   Physical Exam Vitals and nursing note reviewed.  Constitutional:      General: He is not in acute distress.    Appearance: He is not toxic-appearing.  HENT:     Head: Normocephalic.  Eyes:     Conjunctiva/sclera: Conjunctivae normal.  Cardiovascular:     Rate and Rhythm: Normal rate and regular rhythm.     Pulses: Normal pulses.     Heart sounds: Normal heart sounds. No murmur. No friction rub. No gallop.   Pulmonary:     Effort: Pulmonary effort is normal.     Breath sounds: Normal breath sounds.  Abdominal:     General: Abdomen is flat. Bowel sounds are normal. There is no distension.     Palpations: Abdomen is soft.     Tenderness: There is no abdominal tenderness. There is no guarding or rebound.  Musculoskeletal:     Cervical back: Neck supple.     Comments: Left foot edematous. Doppler used to to find lower extremity pulses.  Limited ROM of left toes and ankle due to edema/pain. Soft compartments.   Skin:    Comments: Large wound on anterior portion of left shin with surrounding erythema and hyperpigmentation. See photo below.   Neurological:     General: No focal deficit present.     Mental Status: He is alert.  Psychiatric:        Mood and Affect: Mood normal.        Behavior: Behavior normal.         ED Results / Procedures / Treatments   Labs (all labs ordered are listed, but only abnormal results are displayed) Labs Reviewed  CBC WITH DIFFERENTIAL/PLATELET - Abnormal; Notable  for the following components:      Result Value   WBC 13.5 (*)    Neutro Abs 8.9 (*)    Abs Immature Granulocytes 0.08 (*)    All other components within normal limits  BASIC METABOLIC PANEL - Abnormal; Notable for the following components:   Glucose, Bld 119 (*)    Creatinine, Ser 1.51 (*)    Calcium 10.6 (*)    GFR calc non Af Amer 46 (*)    GFR calc Af Amer 53 (*)    All other components within normal limits  LACTIC ACID, PLASMA - Abnormal; Notable for the following components:   Lactic Acid, Venous 2.1 (*)    All other components within normal limits  LACTIC ACID, PLASMA    EKG None  Radiology VAS US LOWER EXTREMITY VENOUS (DVT) ([facility] and [facility] 7a-7p)  Result Date: 08/21/2019  Lower Venous DVTStudy Indications: Ulceration, Pain, Erythema, and Edema.  Performing Technologist: Levada Schillingharlotte Bynum RDMS, RVT  Examination Guidelines: A complete evaluation includes B-mode imaging, spectral Doppler, color Doppler, and power Doppler as needed of all accessible portions of each vessel. Bilateral testing is considered an integral part of a complete examination. Limited examinations for reoccurring indications may be performed as noted. The reflux portion of the exam is performed with the patient in reverse Trendelenburg.  +-----+---------------+---------+-----------+----------+--------------+  RIGHTCompressibilityPhasicitySpontaneityPropertiesThrombus Aging +-----+---------------+---------+-----------+----------+--------------+ CFV  Full           Yes      Yes                                 +-----+---------------+---------+-----------+----------+--------------+   +---------+---------------+---------+-----------+----------+--------------+ LEFT     CompressibilityPhasicitySpontaneityPropertiesThrombus Aging +---------+---------------+---------+-----------+----------+--------------+ CFV      Full           Yes      Yes                                 +---------+---------------+---------+-----------+----------+--------------+ SFJ      Full                                                        +---------+---------------+---------+-----------+----------+--------------+ FV Prox  Full                                                        +---------+---------------+---------+-----------+----------+--------------+ FV Mid   Full                                                        +---------+---------------+---------+-----------+----------+--------------+ FV DistalFull                                                        +---------+---------------+---------+-----------+----------+--------------+ PFV  Full                                                        +---------+---------------+---------+-----------+----------+--------------+ POP      Full           Yes      Yes                                 +---------+---------------+---------+-----------+----------+--------------+ PTV      Full                                                        +---------+---------------+---------+-----------+----------+--------------+ PERO                                                  Not visualized +---------+---------------+---------+-----------+----------+--------------+ GSV      Full                                                         +---------+---------------+---------+-----------+----------+--------------+     Summary: RIGHT: - No evidence of common femoral vein obstruction.  LEFT: - There is no evidence of deep vein thrombosis in the lower extremity.  - No cystic structure found in the popliteal fossa. - significant interstitial fluid and pitting edema in lower left leg.  *See table(s) above for measurements and observations.    Preliminary     Procedures Procedures (including critical care time)  Medications Ordered in ED Medications  acetaminophen (TYLENOL) tablet 650 mg (650 mg Oral Given 08/21/19 1734)  cefTRIAXone (ROCEPHIN) 1 g in sodium chloride 0.9 % 100 mL IVPB (0 g Intravenous Stopped 08/21/19 1905)  sodium chloride 0.9 % bolus 1,000 mL (1,000 mLs Intravenous Bolus from Bag 08/21/19 1830)    ED Course  I have reviewed the triage vital signs and the nursing notes.  Pertinent labs & imaging results that were available during my care of the patient were reviewed by me and considered in my medical decision making (see chart for details).  Clinical Course as of Aug 20 2017  Tue Aug 21, 2019  1749 WBC(!): 13.5 [CA]  1751 Lactic Acid, Venous(!!): 2.1 [CA]  1833 Informed by tech that patient is negative for DVT   [CA]  1916 Lactic Acid, Venous: 1.1 [CA]    Clinical Course User Index [CA] Mannie Stabile, PA-C   MDM Rules/Calculators/A&P                     69 year old male presents to the ED due to persistent left lower extremity wound x1 year.  Patient has a history of PAD status post stent placement on 08/06/2019.  Stable vitals.  Patient is afebrile, not tachycardic or hypoxic.  No concern for sepsis at this time.  Patient no acute distress  and nontoxic-appearing.  Large wound over left shin.  See photo above.  Surrounding erythema concerning for cellulitis.  Edematous left foot.  Distal pulses detected with Doppler.  Will obtain routine labs and lactic acid. Will also order DVT study to rule out  DVT given patient's edema.  CBC significant for mild leukocytosis at 13.5, but otherwise reassuring. Elevated lactic acid at 2.1. Will give IVFs and IV rocephin.  BMP reassuring with mild hyperglycemia at 119 and AKI with creatinine at 1.51.  Normal anion gap.  Doubt DKA.  Lactic acid normalized after IVF. Doubt sepsis at this time. Will discharge patient to ambulatory referral to wound care and Keflex x7 days. Discussed case with Dr. Gilford Raid who evaluated patient at bedside and agrees with assessment and plan. Strict ED precautions discussed with patient. Patient states understanding and agrees to plan. Patient discharged home in no acute distress and stable vitals  Final Clinical Impression(s) / ED Diagnoses Final diagnoses:  Wound cellulitis    Rx / DC Orders ED Discharge Orders         Ordered    AMB referral to wound care center     08/21/19 1835    cephALEXin (KEFLEX) 500 MG capsule  4 times daily     08/21/19 1836           Karie Kirks 08/21/19 2021    Isla Pence, MD 08/21/19 2031

## 2019-08-22 ENCOUNTER — Telehealth: Payer: Self-pay

## 2019-08-22 NOTE — Telephone Encounter (Signed)
Pt called with c/o of poor wound healing after being seen in ED yesterday. This has been on going. He is unhappy he can not be seen sooner at wound care center. I have called wound care center to make sure he is on a cancellation list. Pt will be called if anything sooner becomes available. He will call us back if symptoms worsen.  I have discussed this with Dois Davenport, [place]. Pt is planning to return for f/u next week with Dr. Randie Heinz.

## 2019-08-27 ENCOUNTER — Encounter (HOSPITAL_BASED_OUTPATIENT_CLINIC_OR_DEPARTMENT_OTHER): Payer: Medicare HMO | Attending: Internal Medicine | Admitting: Internal Medicine

## 2019-08-27 ENCOUNTER — Other Ambulatory Visit: Payer: Self-pay

## 2019-08-27 DIAGNOSIS — S81812A Laceration without foreign body, left lower leg, initial encounter: Secondary | ICD-10-CM | POA: Insufficient documentation

## 2019-08-27 DIAGNOSIS — I70242 Atherosclerosis of native arteries of left leg with ulceration of calf: Secondary | ICD-10-CM | POA: Insufficient documentation

## 2019-08-27 DIAGNOSIS — L97322 Non-pressure chronic ulcer of left ankle with fat layer exposed: Secondary | ICD-10-CM | POA: Diagnosis not present

## 2019-08-27 DIAGNOSIS — L97828 Non-pressure chronic ulcer of other part of left lower leg with other specified severity: Secondary | ICD-10-CM | POA: Insufficient documentation

## 2019-08-27 DIAGNOSIS — Z87891 Personal history of nicotine dependence: Secondary | ICD-10-CM | POA: Insufficient documentation

## 2019-08-27 DIAGNOSIS — W109XXA Fall (on) (from) unspecified stairs and steps, initial encounter: Secondary | ICD-10-CM | POA: Insufficient documentation

## 2019-08-27 DIAGNOSIS — I70243 Atherosclerosis of native arteries of left leg with ulceration of ankle: Secondary | ICD-10-CM | POA: Diagnosis not present

## 2019-08-27 DIAGNOSIS — R6889 Other general symptoms and signs: Secondary | ICD-10-CM | POA: Diagnosis not present

## 2019-08-27 NOTE — Progress Notes (Signed)
CORDEL, DREWES ([id number]) Visit Report for 08/27/2019 Abuse/Suicide Risk Screen Details Patient Name: Date of Service: Matthew [name], Matthew [name] 08/27/2019 1:15 PM Medical Record [id number] Patient Account Number: [id number] Date of Birth/Sex: Treating RN: 1950/12/12 (69 y.o. Matthew [name]) [name] Primary Care Josuha Fontanez: PATIENT, NO Other Clinician: Referring Brytney Somes: Treating Lois Ostrom/Extender:Robson, Lorenso Quarry, CAROLINE Weeks in Treatment: 0 Abuse/Suicide Risk Screen Items Answer ABUSE RISK SCREEN: Has anyone close to you tried to hurt or harm you recentlyo No Do you feel uncomfortable with anyone in your familyo No Has anyone forced you do things that you didnt want to doo No Electronic Signature(s) Signed: 08/27/2019 5:05:11 PM By: [name] RN Entered By: [name] on 08/27/2019 13:46:21 -------------------------------------------------------------------------------- Activities of Daily Living Details Patient Name: Date of Service: [name], [name] 08/27/2019 1:15 PM Medical Record [id number] Patient Account Number: [id number] Date of Birth/Sex: Treating RN: 1951/01/12 (69 y.o. Matthew [name]) [name] Primary Care Dedric Ethington: PATIENT, NO Other Clinician: Referring Dillen Belmontes: Treating Andru Genter/Extender:Robson, Lorenso Quarry, CAROLINE Weeks in Treatment: 0 Activities of Daily Living Items Answer Activities of Daily Living (Please select one for each item) Drive Automobile Not Able Take Medications Completely Able Use Telephone Completely Able Care for Appearance Completely Able Use Toilet Completely Able Bath / Shower Completely Able Dress Self Completely Able Feed Self Completely Able Walk Completely Able Get In / Out Bed Completely Able Housework Completely Able Prepare Meals Completely Able Handle Money Completely Able Shop for Self Completely Able Electronic Signature(s) Signed: 08/27/2019 5:05:11 PM By: [name] RN Entered By: [name] on  08/27/2019 13:47:06 -------------------------------------------------------------------------------- Education Screening Details Patient Name: Date of Service: Matthew [name] 08/27/2019 1:15 PM Medical Record [id number] Patient Account Number: [id number] Date of Birth/Sex: Treating RN: 03-Sep-1950 (69 y.o. Matthew [name]) [name] Primary Care Porshia Blizzard: PATIENT, NO Other Clinician: Referring Pranavi Aure: Treating Philamena Kramar/Extender:Robson, Lorenso Quarry, CAROLINE Weeks in Treatment: 0 Primary Learner Assessed: Patient Learning Preferences/Education Level/Primary Language Learning Preference: Explanation Highest Education Level: College or Above Preferred Language: English Cognitive Barrier Language Barrier: No Translator Needed: No Memory Deficit: No Emotional Barrier: No Cultural/Religious Beliefs Affecting Medical Care: No Physical Barrier Impaired Vision: No Impaired Hearing: No Decreased Hand dexterity: No Knowledge/Comprehension Knowledge Level: Medium Comprehension Level: High Ability to understand written High instructions: Ability to understand verbal High instructions: Motivation Anxiety Level: Calm Cooperation: Cooperative Education Importance: Acknowledges Need Interest in Health Problems: Asks Questions Perception: Coherent Willingness to Engage in Self- High Management Activities: Readiness to Engage in Self- High Management Activities: Electronic Signature(s) Signed: 08/27/2019 5:05:11 PM By: [name] RN Entered By: [name] on 08/27/2019 13:47:37 -------------------------------------------------------------------------------- Fall Risk Assessment Details Patient Name: Date of Service: Matthew [name] 08/27/2019 1:15 PM Medical Record [id number] Patient Account Number: [id number] Date of Birth/Sex: Treating RN: 08-16-1950 (69 y.o. Matthew [name]) [name] Primary Care Latania Bascomb: PATIENT, NO Other Clinician: Referring Joshoa Shawler: Treating  Aldred Mase/Extender:Robson, Lorenso Quarry, CAROLINE Weeks in Treatment: 0 Fall Risk Assessment Items Have you had 2 or more falls in the last 12 monthso 0 Yes Have you had any fall that resulted in injury in the last 12 monthso 0 No FALLS RISK SCREEN History of falling - immediate or within 3 months 25 Yes Secondary diagnosis (Do you have 2 or more medical diagnoseso) 0 No Ambulatory aid None/bed rest/wheelchair/nurse 0 No Crutches/cane/walker 0 No Furniture 0 No Intravenous therapy Access/Saline/Heparin Lock 0 No Weak (short steps with or without shuffle, stooped but able to lift head 0 No while walking, may seek support from furniture) Impaired (short steps with shuffle, may have  difficulty arising from chair, 0 No head down, impaired balance) Mental Status Oriented to own ability 0 No Overestimates or forgets limitations 0 No Risk Level: Medium Risk Score: 25 Electronic Signature(s) Signed: 08/27/2019 5:05:11 PM By: Carlene Coria RN Entered By: Carlene Coria on 08/27/2019 13:48:45 -------------------------------------------------------------------------------- Foot Assessment Details Patient Name: Date of Service: Matthew Amass T. 08/27/2019 1:15 PM Medical Record [id number] Patient Account Number: [id number] Date of Birth/Sex: Treating RN: 01/25/1951 (69 y.o. Jerilynn Mages) Carlene Coria Primary Care Daymian Lill: PATIENT, NO Other Clinician: Referring Makaveli Hoard: Treating Eero Dini/Extender:Robson, Pablo Ledger, CAROLINE Weeks in Treatment: 0 Foot Assessment Items Site Locations + = Sensation present, - = Sensation absent, C = Callus, U = Ulcer R = Redness, W = Warmth, M = Maceration, PU = Pre-ulcerative lesion F = Fissure, S = Swelling, D = Dryness Assessment Right: Left: Other Deformity: No No Prior Foot Ulcer: No No Prior Amputation: No No Charcot Joint: No No Ambulatory Status: Ambulatory Without Help Gait: Steady Electronic Signature(s) Signed: 08/27/2019 5:05:11 PM By:  Carlene Coria RN Entered By: Carlene Coria on 08/27/2019 13:52:49 -------------------------------------------------------------------------------- Nutrition Risk Screening Details Patient Name: Date of Service: MAKSYMILIAN, MABEY 08/27/2019 1:15 PM Medical Record [id number] Patient Account Number: [id number] Date of Birth/Sex: Treating RN: Jan 26, 1951 (68 y.o. Jerilynn Mages) Carlene Coria Primary Care Sharran Caratachea: PATIENT, NO Other Clinician: Referring Alexxander Kurt: Treating Shey Yott/Extender:Robson, Pablo Ledger, CAROLINE Weeks in Treatment: 0 Height (in): 72 Weight (lbs): 130 Body Mass Index (BMI): 17.6 Nutrition Risk Screening Items Score Screening NUTRITION RISK SCREEN: I have an illness or condition that made me change the kind and/or 0 No amount of food I eat I eat fewer than two meals per day 0 No I eat few fruits and vegetables, or milk products 0 No I have three or more drinks of beer, liquor or wine almost every day 0 No I have tooth or mouth problems that make it hard for me to eat 0 No I don't always have enough money to buy the food I need 0 No I eat alone most of the time 0 No I take three or more different prescribed or over-the-counter drugs a day 1 Yes 2 Yes Without wanting to, I have lost or gained 10 pounds in the last six months I am not always physically able to shop, cook and/or feed myself 0 No Nutrition Protocols Good Risk Protocol Provide education on Moderate Risk Protocol 0 nutrition High Risk Proctocol Risk Level: Moderate Risk Score: 3 Electronic Signature(s) Signed: 08/27/2019 5:05:11 PM By: Carlene Coria RN Entered By: Carlene Coria on 08/27/2019 13:50:37

## 2019-08-27 NOTE — Progress Notes (Signed)
DONIS, PINDER ([id number]) Visit Report for 08/27/2019 Chief Complaint Document Details Patient Name: Date of Service: Matthew [name], Matthew [name] 08/27/2019 1:15 PM Medical Record [id number] Patient Account Number: [id number] Date of Birth/Sex: Treating RN: April 22, 1951 (69 y.o. [name] Primary Care Provider: PATIENT, NO Other Clinician: Referring Provider: Treating Provider/Extender:Takita Riecke, Lorenso Quarry, CAROLINE Weeks in Treatment: 0 Information Obtained from: Patient Chief Complaint 08/27/2019; patient is here for review of wound on the left anterior mid tibia and the left lateral ankle area. Electronic Signature(s) Signed: 08/27/2019 5:34:39 PM By: Baltazar Najjar MD Entered By: Baltazar Najjar on 08/27/2019 15:25:48 -------------------------------------------------------------------------------- HPI Details Patient Name: Date of Service: Matthew [name], Matthew [name] 08/27/2019 1:15 PM Medical Record [id number] Patient Account Number: [id number] Date of Birth/Sex: Treating RN: 26-Feb-1951 (69 y.o. [name] Primary Care Provider: PATIENT, NO Other Clinician: Referring Provider: Treating Provider/Extender:[name], Lorenso Quarry, CAROLINE Weeks in Treatment: 0 History of Present Illness HPI Description: ADMISSION 08/27/2019 This is a 69 year old man who tells me that he fell going up stairs about a year ago resulting in a deep laceration type wound on the left anterior mid tibia. He was soon found to have significant PAD/critical limb ischemia. He had had previous noninvasive studies in 2017 showing an ABI of 0.82 on the right and 0.91 on the left with monophasic to biphasic waveforms. On 07/18/2019 his ABI in the right was 0.58 on the left 0.52 with TBI's of 0.51 on the right and 0 on the left. He underwent an angiogram by Dr. Randie Heinz on 3/1. He underwent bilateral iliac artery stents he was also discovered to have a left-sided SFA with it was patent up to 10 minutes  centimeters proximal to the adductor canal but then became stenotic with a 95% stenosis for 15 cm. Dominant runoff is via the posterior tibial artery. The patient still has the wound on the left anterior mid tibia. He has been using Vaseline to this. He wraps this and gauze and some form of Ace wrap. He complains bitterly of the pain also complains about falling with the leg giving out on him. About 2 weeks ago he developed a new wound on the left lateral ankle. He had a DVT rule out study on 3/16/[facility] 1 that was negative for a DVT Past medical history includes cervical spondylosis, left carotid bruit, marked PAD. ABI in our clinic was 0.71 on the left Electronic Signature(s) Signed: 08/27/2019 5:34:39 PM By: Baltazar Najjar MD Entered By: Baltazar Najjar on 08/27/2019 15:29:53 -------------------------------------------------------------------------------- Physical Exam Details Patient Name: Date of Service: Matthew [name], Matthew [name] 08/27/2019 1:15 PM Medical Record [id number] Patient Account Number: [id number] Date of Birth/Sex: Treating RN: 1950-10-08 (69 y.o. [name] Primary Care Provider: PATIENT, NO Other Clinician: Referring Provider: Treating Provider/Extender:Mylan Lengyel, Lorenso Quarry, CAROLINE Weeks in Treatment: 0 Constitutional Sitting or standing Blood Pressure is within target range for patient.. Pulse regular and within target range for patient.Marland Kitchen Respirations regular, non-labored and within target range.. Temperature is normal and within the target range for the patient.Marland Kitchen Appears in no distress. Respiratory work of breathing is normal. Cardiovascular Heart rhythm and rate regular, without murmur or gallop.. Popliteal pulses absent on the left. I could not examine his femoral. It will pulses absent on the left. Gastrointestinal (GI) Abdomen is soft and non-distended without masses or tenderness.. No liver or spleen enlargement. Genitourinary (GU) Bladder is not  distended. Neurological Present in both knee jerks absent at the ankle jerks.. Notes Wound exam; the patient has a long wound in the anterior mid tibia.  Necrotic debris on the surface. I did not debride this because of complaints of pain. Area on the left lateral ankle area. This did not have a viable surface either. The leg is clearly ischemic there is ischemic change in his tips of his 4 toes 1-4. Electronic Signature(s) Signed: 08/27/2019 5:34:39 PM By: Baltazar Najjar MD Entered By: Baltazar Najjar on 08/27/2019 15:32:43 -------------------------------------------------------------------------------- Physician Orders Details Patient Name: Date of Service: Matthew [name], Matthew [name] 08/27/2019 1:15 PM Medical Record [id number] Patient Account Number: [id number] Date of Birth/Sex: Treating RN: 09-18-1950 (69 y.o. [name] Primary Care Provider: PATIENT, NO Other Clinician: Referring Provider: Treating Provider/Extender:[name], Lorenso Quarry, CAROLINE Weeks in Treatment: 0 Verbal / Phone Orders: No Diagnosis Coding Follow-up Appointments Return Appointment in 1 week. Dressing Change Frequency Wound #1 Left,Distal,Anterior Lower Leg Do not change entire dressing for one week. Wound #2 Left,Lateral Ankle Do not change entire dressing for one week. Skin Barriers/Peri-Wound Care Barrier cream Moisturizing lotion TCA Cream or Ointment - mix with lotion Wound Cleansing May shower with protection. - use cast protector Primary Wound Dressing Wound #1 Left,Distal,Anterior Lower Leg Iodoflex Wound #2 Left,Lateral Ankle Iodoflex Secondary Dressing Wound #1 Left,Distal,Anterior Lower Leg Dry Gauze ABD pad Wound #2 Left,Lateral Ankle Dry Gauze ABD pad Edema Control Kerlix and Coban - Left Lower Extremity - ****LIGHTLY WRAP**** Avoid standing for long periods of time Elevate legs to the level of the heart or above for 30 minutes daily and/or when sitting, a  frequency of: - throughout the day Electronic Signature(s) Signed: 08/27/2019 5:18:21 PM By: Zandra Abts RN, BSN Signed: 08/27/2019 5:34:39 PM By: Baltazar Najjar MD Entered By: Zandra Abts on 08/27/2019 14:58:47 -------------------------------------------------------------------------------- Problem List Details Patient Name: Date of Service: DRADEN, COTTING[place]AM 08/27/2019 1:15 PM Medical Record [id number] Patient Account Number: [id number] Date of Birth/Sex: Treating RN: 1951-01-05 (69 y.o. 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 [name] Primary Care Provider: Other Clinician: PATIENT, NO Referring Provider: Treating Provider/Extender:Alli Jasmer, Lorenso Quarry, CAROLINE Weeks in Treatment: 0 Subjective Chief Complaint Information obtained from Patient 08/27/2019; patient is here for review of wound  on the left anterior mid tibia and the left lateral ankle area. History of Present Illness (HPI) ADMISSION 08/27/2019 This is a 69 year old man who tells me that he fell going up stairs about a year ago resulting in a deep laceration type wound on the left anterior mid tibia. He was soon found to have significant PAD/critical limb ischemia. He had had previous noninvasive studies in 2017 showing an ABI of 0.82 on the right and 0.91 on the left with monophasic to biphasic waveforms. On 07/18/2019 his ABI in the right was 0.58 on the left 0.52 with TBI's of 0.51  on the right and 0 on the left. He underwent an angiogram by Dr. Randie Heinz on 3/1. He underwent bilateral iliac artery stents he was also discovered to have a left-sided SFA with it was patent up to 10 minutes centimeters proximal to the adductor canal but then became stenotic with a 95% stenosis for 15 cm. Dominant runoff is via the posterior tibial artery. The patient still has the wound on the left anterior mid tibia. He has been using Vaseline to this. He wraps this and gauze and some form of Ace wrap. He complains bitterly of the pain also complains about falling with the leg giving out on him. About 2 weeks ago he developed a new wound on the left lateral ankle. He had a DVT rule out study on 3/16/[facility] 1 that was negative for a DVT Past medical history includes cervical spondylosis, left carotid bruit, marked PAD. ABI in our clinic was 0.71 on the left Patient History Information obtained from Patient. Allergies penicillin, codeine Family History Cancer - Paternal Grandparents, Diabetes - Father,Child, Hypertension - Mother, No family history of Heart Disease, Hereditary Spherocytosis, Kidney Disease, Lung Disease, Seizures, Stroke, Thyroid Problems, Tuberculosis. Social History Former smoker, Marital Status - Divorced, Alcohol Use - Never, Drug Use - No History, Caffeine Use - Daily. Medical History Eyes Denies history of  Cataracts, Glaucoma, Optic Neuritis Ear/Nose/Mouth/Throat Denies history of Chronic sinus problems/congestion, Middle ear problems Hematologic/Lymphatic Denies history of Anemia, Hemophilia, Human Immunodeficiency Virus, Lymphedema, Sickle Cell Disease Respiratory Denies history of Aspiration, Asthma, Chronic Obstructive Pulmonary Disease (COPD), Pneumothorax, Sleep Apnea, Tuberculosis Cardiovascular Patient has history of Peripheral Arterial Disease Denies history of Angina, Arrhythmia, Congestive Heart Failure, Coronary Artery Disease, Deep Vein Thrombosis, Hypertension, Hypotension, Myocardial Infarction, Peripheral Venous Disease, Phlebitis, Vasculitis Gastrointestinal Denies history of Cirrhosis , Colitis, Crohnoos, Hepatitis A, Hepatitis B Endocrine Denies history of Type I Diabetes, Type II Diabetes Genitourinary Denies history of End Stage Renal Disease Immunological Denies history of Lupus Erythematosus, Raynaudoos, Scleroderma Integumentary (Skin) Denies history of History of Burn Musculoskeletal Denies history of Gout, Rheumatoid Arthritis, Osteoarthritis, Osteomyelitis Neurologic Denies history of Dementia, Neuropathy, Quadriplegia, Paraplegia, Seizure Disorder Oncologic Denies history of Received Chemotherapy, Received Radiation Psychiatric Denies history of Anorexia/bulimia, Confinement Anxiety Review of Systems (ROS) Constitutional Symptoms (General Health) Denies complaints or symptoms of Fatigue, Fever, Chills, Marked Weight Change. Eyes Complains or has symptoms of Vision Changes. Denies complaints or symptoms of Dry Eyes, Glasses / Contacts. Ear/Nose/Mouth/Throat Denies complaints or symptoms of Chronic sinus problems or rhinitis. Respiratory Denies complaints or symptoms of Chronic or frequent coughs, Shortness of Breath. Cardiovascular Denies complaints or symptoms of Chest pain. Gastrointestinal Denies complaints or symptoms of Frequent diarrhea,  Nausea, Vomiting. Endocrine Denies complaints or symptoms of Heat/cold intolerance. Genitourinary Denies complaints or symptoms of Frequent urination. Integumentary (Skin) Complains or has symptoms of Wounds. Musculoskeletal Denies complaints or symptoms of Muscle Pain, Muscle Weakness. Neurologic Denies complaints or symptoms of Numbness/parasthesias. Psychiatric Denies complaints or symptoms of Claustrophobia, Suicidal. Objective Constitutional Sitting or standing Blood Pressure is within target range for patient.. Pulse regular and within target range for patient.Marland Kitchen Respirations regular, non-labored and within target range.. Temperature is normal and within the target range for the patient.Marland Kitchen Appears in no distress. Vitals Time Taken: 1:35 PM, Height: 72 in, Source: Stated, Weight: 130 lbs, Source: Stated, BMI: 17.6, Temperature: 98.4 F, Pulse: 93 bpm, Respiratory Rate: 18 breaths/min, Blood Pressure: 112/47 mmHg. Respiratory work of breathing is normal. Cardiovascular Heart rhythm and rate regular, without murmur or gallop.Marland Kitchen  Popliteal pulses absent on the left. I could not examine his femoral. It will pulses absent on the left. Gastrointestinal (GI) Abdomen is soft and non-distended without masses or tenderness.. No liver or spleen enlargement. Genitourinary (GU) Bladder is not distended. Neurological Present in both knee jerks absent at the ankle jerks.. General Notes: Wound exam; the patient has a long wound in the anterior mid tibia. Necrotic debris on the surface. I did not debride this because of complaints of pain. ooArea on the left lateral ankle area. This did not have a viable surface either. ooThe leg is clearly ischemic there is ischemic change in his tips of his 4 toes 1-4. Integumentary (Hair, Skin) Wound #1 status is Open. Original cause of wound was Trauma. The wound is located on the [facility] Lower Leg. The wound measures 10.5cm length x 2.5cm  width x 0.2cm depth; 20.617cm^2 area and 4.123cm^3 volume. There is Fat Layer (Subcutaneous Tissue) Exposed exposed. There is no tunneling or undermining noted. There is a medium amount of serosanguineous drainage noted. The wound margin is flat and intact. There is small (1-33%) pink granulation within the wound bed. There is a large (67-100%) amount of necrotic tissue within the wound bed including Adherent Slough. Wound #2 status is Open. Original cause of wound was Blister. The wound is located on the Left,Lateral Ankle. The wound measures 0.5cm length x 0.7cm width x 0.1cm depth; 0.275cm^2 area and 0.027cm^3 volume. There is no tunneling or undermining noted. There is a medium amount of serosanguineous drainage noted. There is no granulation within the wound bed. There is a large (67-100%) amount of necrotic tissue within the wound bed including Adherent Slough. 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 Dressing Change Frequency: Wound #1 Left,Distal,Anterior Lower Leg: Do not change entire dressing for one week. Wound #2 Left,Lateral Ankle: Do not change entire dressing for one week. Skin Barriers/Peri-Wound Care: Barrier cream Moisturizing lotion TCA Cream or Ointment - mix with lotion Wound Cleansing: May shower with protection. - use cast protector Primary Wound Dressing: Wound #1 Left,Distal,Anterior Lower Leg: Iodoflex Wound #2 Left,Lateral Ankle: Iodoflex Secondary Dressing: Wound #1 Left,Distal,Anterior Lower Leg: Dry Gauze ABD pad Wound #2 Left,Lateral Ankle: Dry Gauze ABD pad Edema Control: Kerlix and Coban - Left  Lower Extremity - ****LIGHTLY WRAP**** Avoid standing for long periods of time Elevate legs to the level of the heart or above for 30 minutes daily and/or when sitting, a frequency of: - throughout the day 1. We are going to use Iodoflex to help debride these wounds 2. The patient apparently has an extensive follow-up with Dr. Randie Heinz I will see what he has planned. 3. He has claudication probably with minimal activity or even at rest but I do not think this accounts for the fact that he does not have antigravity strength at the left hip. I wonder if there is a neurologic issue here. 4. No mechanical debridement until we are more certain that everything can be done to improve the blood flow here. 5. The patient probably also has venous insufficiency recent DVT rule out was negative. We put him in a light coat Curlex Coban Electronic Signature(s) Signed: 08/27/2019 5:34:39 PM By: Baltazar Najjar MD Entered  By: Baltazar Najjarobson, Anavi Branscum on 08/27/2019 15:34:11 -------------------------------------------------------------------------------- HxROS Details Patient Name: Date of Service: Matthew ShutterHAXTON, Lakeith T. 08/27/2019 1:15 PM Medical Record [id number] Patient Account Number: [id number] Date of Birth/Sex: Treating RN: 1950/06/08 (68 y.o. Judie PetitM) Yevonne PaxEpps, Carrie Primary Care Provider: PATIENT, NO Other Clinician: Referring Provider: Treating Provider/Extender:Ashanta Amoroso, Lorenso QuarryMichael ABERMAN, CAROLINE Weeks in Treatment: 0 Information Obtained From Patient Constitutional Symptoms (General Health) Complaints and Symptoms: Negative for: Fatigue; Fever; Chills; Marked Weight Change Eyes Complaints and Symptoms: Positive for: Vision Changes Negative for: Dry Eyes; Glasses / Contacts Medical History: Negative for: Cataracts; Glaucoma; Optic Neuritis Ear/Nose/Mouth/Throat Complaints and Symptoms: Negative for: Chronic sinus problems or rhinitis Medical History: Negative for: Chronic sinus problems/congestion; Middle  ear problems Respiratory Complaints and Symptoms: Negative for: Chronic or frequent coughs; Shortness of Breath Medical History: Negative for: Aspiration; Asthma; Chronic Obstructive Pulmonary Disease (COPD); Pneumothorax; Sleep Apnea; Tuberculosis Cardiovascular Complaints and Symptoms: Negative for: Chest pain Medical History: Positive for: Peripheral Arterial Disease Negative for: Angina; Arrhythmia; Congestive Heart Failure; Coronary Artery Disease; Deep Vein Thrombosis; Hypertension; Hypotension; Myocardial Infarction; Peripheral Venous Disease; Phlebitis; Vasculitis Gastrointestinal Complaints and Symptoms: Negative for: Frequent diarrhea; Nausea; Vomiting Medical History: Negative for: Cirrhosis ; Colitis; Crohns; Hepatitis A; Hepatitis B Endocrine Complaints and Symptoms: Negative for: Heat/cold intolerance Medical History: Negative for: Type I Diabetes; Type II Diabetes Genitourinary Complaints and Symptoms: Negative for: Frequent urination Medical History: Negative for: End Stage Renal Disease Integumentary (Skin) Complaints and Symptoms: Positive for: Wounds Medical History: Negative for: History of Burn Musculoskeletal Complaints and Symptoms: Negative for: Muscle Pain; Muscle Weakness Medical History: Negative for: Gout; Rheumatoid Arthritis; Osteoarthritis; Osteomyelitis Neurologic Complaints and Symptoms: Negative for: Numbness/parasthesias Medical History: Negative for: Dementia; Neuropathy; Quadriplegia; Paraplegia; Seizure Disorder Psychiatric Complaints and Symptoms: Negative for: Claustrophobia; Suicidal Medical History: Negative for: Anorexia/bulimia; Confinement Anxiety Hematologic/Lymphatic Medical History: Negative for: Anemia; Hemophilia; Human Immunodeficiency Virus; Lymphedema; Sickle Cell Disease Immunological Medical History: Negative for: Lupus Erythematosus; Raynauds; Scleroderma Oncologic Medical History: Negative for: Received  Chemotherapy; Received Radiation Immunizations Pneumococcal Vaccine: Received Pneumococcal Vaccination: No Implantable Devices None Family and Social History Cancer: Yes - Paternal Grandparents; Diabetes: Yes - Father,Child; Heart Disease: No; Hereditary Spherocytosis: No; Hypertension: Yes - Mother; Kidney Disease: No; Lung Disease: No; Seizures: No; Stroke: No; Thyroid Problems: No; Tuberculosis: No; Former smoker; Marital Status - Divorced; Alcohol Use: Never; Drug Use: No History; Caffeine Use: Daily; Financial Concerns: No; Food, Clothing or Shelter Needs: No; Support System Lacking: No; Transportation Concerns: No Electronic Signature(s) Signed: 08/27/2019 5:05:11 PM By: Yevonne PaxEpps, Carrie RN Signed: 08/27/2019 5:34:39 PM By: Baltazar Najjarobson, Saqib Cazarez MD Entered By: Yevonne PaxEpps, Carrie on 08/27/2019 13:46:08 -------------------------------------------------------------------------------- SuperBill Details Patient Name: Date of Service: Matthew ShutterHAXTON, Joseh T. 08/27/2019 Medical Record [id number] Patient Account Number: [id number] Date of Birth/Sex: Treating RN: 1950/06/08 (69 y.o. 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 I70.243 Atherosclerosis of native arteries of left leg with ulceration of ankle L97.828 Non-pressure chronic ulcer of other part of left lower leg with other specified severity L97.322 Non-pressure chronic ulcer of left ankle with fat layer exposed Facility Procedures CPT4 Code: [id number] Description: 99214 - WOUND CARE VISIT-LEV 4 EST PT Modifier: Quantity: 1 Physician Procedures CPT4 Code Description: [id number] WC PHYS LEVEL 3 NEW PT ICD-10 Diagnosis Description I70.242 Atherosclerosis of native arteries of left leg with ulce  I70.243 Atherosclerosis of native arteries of left leg  with ulce L97.828 Non-pressure chronic ulcer of  other part of left lower l severity L97.322 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

## 2019-08-27 NOTE — Progress Notes (Addendum)
Matthew [name] ([id number]) Visit Report for 08/27/2019 Allergy List Details Patient Name: Date of Service: [name], [name] 08/27/2019 1:15 PM Medical Record [id number] Patient Account Number: [id number] Date of Birth/Sex: Treating RN: 06-07-1951 (69 y.o. Matthew [name]) Matthew [name] Primary Care Lulla Linville: PATIENT, NO Other Clinician: Referring Aunika Kirsten: Treating Lional Icenogle/Extender:Robson, Lorenso Quarry, CAROLINE Weeks in Treatment: 0 Allergies Active Allergies penicillin codeine Allergy Notes Electronic Signature(s) Signed: 08/27/2019 5:05:11 PM By: Matthew Pax RN Entered By: Matthew [name] on 08/27/2019 13:36:54 -------------------------------------------------------------------------------- Arrival Information Details Patient Name: Date of Service: Matthew [name] 08/27/2019 1:15 PM Medical Record [id number] Patient Account Number: [id number] Date of Birth/Sex: Treating RN: 14-May-1951 (69 y.o. Matthew [name]) Matthew [name] Primary Care Maryjo Ragon: PATIENT, NO Other Clinician: Referring Jacobi Nile: Treating Isidro Monks/Extender:Robson, Lorenso Quarry, CAROLINE Weeks in Treatment: 0 Visit Information Patient Arrived: Ambulatory Arrival Time: 13:34 Accompanied By: self Transfer Assistance: None Patient Identification Verified: Yes Secondary Verification Process Completed: Yes Patient Requires Transmission-Based No Precautions: Patient Has Alerts: No Electronic Signature(s) Signed: 08/27/2019 5:05:11 PM By: Matthew Pax RN Entered By: Matthew [name] on 08/27/2019 13:35:17 -------------------------------------------------------------------------------- Clinic Level of Care Assessment Details Patient Name: Date of Service: Matthew [name] 08/27/2019 1:15 PM Medical Record [id number] Patient Account Number: [id number] Date of Birth/Sex: Treating RN: August 22, 1950 (69 y.o. [name] Primary Care Manasvini Whatley: PATIENT, NO Other Clinician: Referring Reyne Falconi: Treating  Riki Gehring/Extender:Robson, Lorenso Quarry, CAROLINE Weeks in Treatment: 0 Clinic Level of Care Assessment Items TOOL 2 Quantity Score X - Use when only an EandM is performed on the INITIAL visit 1 0 ASSESSMENTS - Nursing Assessment / Reassessment X - General Physical Exam (combine w/ comprehensive assessment (listed just below) 1 20 when performed on new pt. evals) X - Comprehensive Assessment (HX, ROS, Risk Assessments, Wounds Hx, etc.) 1 25 ASSESSMENTS - Wound and Skin Assessment / Reassessment []  - Simple Wound Assessment / Reassessment - one wound 0 X - Complex Wound Assessment / Reassessment - multiple wounds 2 5 []  - Dermatologic / Skin Assessment (not related to wound area) 0 ASSESSMENTS - Ostomy and/or Continence Assessment and Care []  - Incontinence Assessment and Management 0 []  - Ostomy Care Assessment and Management (repouching, etc.) 0 PROCESS - Coordination of Care X - Simple Patient / Family Education for ongoing care 1 15 []  - Complex (extensive) Patient / Family Education for ongoing care 0 X - Staff obtains Consents, Records, Test Results / Process Orders 1 10 []  - Staff telephones HHA, Nursing Homes / Clarify orders / etc 0 []  - Routine Transfer to another Facility (non-emergent condition) 0 []  - Routine Hospital Admission (non-emergent condition) 0 []  - New Admissions / / Ordering NPWT, Apligraf, etc. 0 []  - Emergency Hospital Admission (emergent condition) 0 X - Simple Discharge Coordination 1 10 []  - Complex (extensive) Discharge Coordination 0 PROCESS - Special Needs []  - Pediatric / Minor Patient Management 0 []  - Isolation Patient Management 0 []  - Hearing / Language / Visual special needs 0 []  - Assessment of Community assistance (transportation, D/C planning, etc.) 0 []  - Additional assistance / Altered mentation 0 []  - Support Surface(s) Assessment (bed, cushion, seat, etc.) 0 INTERVENTIONS - Wound Cleansing / Measurement X -  Wound Imaging (photographs - any number of wounds) 1 5 []  - Wound Tracing (instead of photographs) 0 []  - Simple Wound Measurement - one wound 0 X - Complex Wound Measurement - multiple wounds 2 5 []  - Simple Wound Cleansing - one wound 0 X - Complex Wound Cleansing - multiple wounds 2 5 INTERVENTIONS -  Wound Dressings []  - Small Wound Dressing one or multiple wounds 0 []  - Medium Wound Dressing one or multiple wounds 0 X - Large Wound Dressing one or multiple wounds 1 20 []  - Application of Medications - injection 0 INTERVENTIONS - Miscellaneous []  - External ear exam 0 []  - Specimen Collection (cultures, biopsies, blood, body fluids, etc.) 0 []  - Specimen(s) / Culture(s) sent or taken to Lab for analysis 0 []  - Patient Transfer (multiple staff / Harrel Lemon Lift / Similar devices) 0 []  - Simple Staple / Suture removal (25 or less) 0 []  - Complex Staple / Suture removal (26 or more) 0 []  - Hypo / Hyperglycemic Management (close monitor of Blood Glucose) 0 X - Ankle / Brachial Index (ABI) - do not check if billed separately 1 15 Has the patient been seen at the hospital within the last three years: Yes Total Score: 150 Level Of Care: New/Established - Level 4 Electronic Signature(s) Signed: 08/27/2019 5:18:21 PM By: Levan Hurst RN, BSN Entered By: Levan Hurst on 08/27/2019 15:01:49 -------------------------------------------------------------------------------- Encounter Discharge Information Details Patient Name: Date of Service: Matthew Amass T. 08/27/2019 1:15 PM Medical Record [id number] Patient Account Number: [id number] Date of Birth/Sex: Treating RN: 10-31-1950 (69 y.o. Hessie Diener Primary Care Onica Davidovich: PATIENT, NO Other Clinician: Referring Bela Nyborg: Treating Amdrew Oboyle/Extender:Robson, Pablo Ledger, CAROLINE Weeks in Treatment: 0 Encounter Discharge Information Items Discharge Condition: Stable Ambulatory Status: Cane Discharge Destination:  Home Transportation: Private Auto Accompanied By: self Schedule Follow-up Appointment: Yes Clinical Summary of Care: Electronic Signature(s) Signed: 08/27/2019 5:27:21 PM By: Deon Pilling Entered By: Deon Pilling on 08/27/2019 16:02:03 -------------------------------------------------------------------------------- Lower Extremity Assessment Details Patient Name: Date of Service: JAEVEN, WANZER 08/27/2019 1:15 PM Medical Record [id number] Patient Account Number: [id number] Date of Birth/Sex: Treating RN: Apr 04, 1951 (68 y.o. Jerilynn Mages) Carlene Coria Primary Care Eldo Umanzor: PATIENT, NO Other Clinician: Referring Khalia Gong: Treating Izyk Marty/Extender:Robson, Pablo Ledger, CAROLINE Weeks in Treatment: 0 Edema Assessment Assessed: [Left: No] [Right: No] E[Left: dema] [Right: :] Calf Left: Right: Point of Measurement: 41 cm From Medial Instep 36 cm cm Ankle Left: Right: Point of Measurement: 10 cm From Medial Instep 28 cm cm Vascular Assessment Blood Pressure: Brachial: [Left:112] Ankle: [Left:Dorsalis Pedis: 80 0.71] Electronic Signature(s) Signed: 08/27/2019 5:05:11 PM By: Carlene Coria RN Entered By: Carlene Coria on 08/27/2019 14:02:31 -------------------------------------------------------------------------------- Multi Wound Chart Details Patient Name: Date of Service: [name] 08/27/2019 1:15 PM Medical Record [id number] Patient Account Number: [id number] Date of Birth/Sex: Treating RN: [birth year] (69 y.o. [name] Primary Care Samon Dishner: PATIENT, NO Other Clinician: Referring Willye Javier: Treating Reuel Lamadrid/Extender:Robson, Pablo Ledger, CAROLINE Weeks in Treatment: 0 Vital Signs Height(in): 72 Pulse(bpm): 93 Weight(lbs): 130 Blood Pressure(mmHg): 112/47 Body Mass Index(BMI): 18 Temperature(F): 98.4 Respiratory 18 Rate(breaths/min): Photos: [1:No Photos] [2:No Photos] [N/A:N/A] Wound Location: [1:Left Lower Leg - Anterior, Distal]  [2:Left Ankle - Lateral] [N/A:N/A] Wounding Event: [1:Trauma] [2:Blister] [N/A:N/A] Primary Etiology: [1:Arterial Insufficiency Ulcer] [2:Arterial Insufficiency Ulcer] [N/A:N/A] Comorbid History: [1:Peripheral Arterial Disease] [2:Peripheral Arterial Disease] [N/A:N/A] Date Acquired: [1:07/08/2018] [2:08/06/2019] [N/A:N/A] Weeks of Treatment: [1:0] [2:0] [N/A:N/A] Wound Status: [1:Open] [2:Open] [N/A:N/A] Measurements L x W x D 10.5x2.5x0.2 [2:0.5x0.7x0.1] [N/A:N/A] (cm) Area (cm) : [1:20.617] [2:0.275] [N/A:N/A] Volume (cm) : [1:4.123] [2:0.027] [N/A:N/A] % Reduction in Area: [1:0.00%] [2:0.00%] [N/A:N/A] % Reduction in Volume: 0.00% [2:0.00%] [N/A:N/A] Classification: [1:Full Thickness Without Exposed Support Structures Exposed Support Structures] [2:Full Thickness Without] [N/A:N/A] Exudate Amount: [1:Medium] [2:Medium] [N/A:N/A] Exudate Type: [1:Serosanguineous] [2:Serosanguineous] [N/A:N/A] Exudate Color: [1:red, brown] [2:red, brown] [N/A:N/A] Wound Margin: [1:Flat and Intact] [2:N/A] [N/A:N/A] Granulation  Amount: [1:Small (1-33%)] [2:None Present (0%)] [N/A:N/A] Granulation Quality: [1:Pink] [2:N/A] [N/A:N/A] Necrotic Amount: [1:Large (67-100%)] [2:Large (67-100%)] [N/A:N/A] Exposed Structures: [1:Fat Layer (Subcutaneous Fascia: No Tissue) Exposed: Yes Fascia: No Tendon: No Muscle: No Joint: No Bone: No Small (1-33%)] [2:Fat Layer (Subcutaneous Tissue) Exposed: No Tendon: No Muscle: No Joint: No Bone: No None] [N/A:N/A N/A] Treatment Notes Electronic Signature(s) Signed: 08/27/2019 5:18:21 PM By: Zandra Abts RN, BSN Signed: 08/27/2019 5:34:39 PM By: Baltazar Najjar MD Entered By: Baltazar Najjar on 08/27/2019 15:25:25 -------------------------------------------------------------------------------- Multi-Disciplinary Care Plan Details Patient Name: Date of Service: Franki Monte T. 08/27/2019 1:15 PM Medical Record [id number] Patient Account Number: [id number] Date of  Birth/Sex: Treating RN: March 17, 1951 (69 y.o. [name] Primary Care Russie Gulledge: PATIENT, NO Other Clinician: Referring Makiyah Zentz: Treating Jaline Pincock/Extender:Robson, Lorenso Quarry, CAROLINE Weeks in Treatment: 0 Active Inactive Abuse / Safety / Falls / Self Care Management Nursing Diagnoses: Potential for falls Potential for injury related to falls Goals: Patient will not experience any injury related to falls Date Initiated: 08/27/2019 Target Resolution Date: 09/28/2019 Goal Status: Active Patient/caregiver will verbalize/demonstrate measures taken to prevent injury and/or falls Date Initiated: 08/27/2019 Target Resolution Date: 09/28/2019 Goal Status: Active Interventions: Assess Activities of Daily Living upon admission and as needed Assess fall risk on admission and as needed Assess: immobility, friction, shearing, incontinence upon admission and as needed Assess impairment of mobility on admission and as needed per policy Assess personal safety and home safety (as indicated) on admission and as needed Provide education on fall prevention Provide education on personal and home safety Notes: Tissue Oxygenation Nursing Diagnoses: Actual ineffective tissue perfusion; peripheral (select once diagnosis is confirmed) Knowledge deficit related to disease process and management Goals: Patient/caregiver will verbalize understanding of disease process and disease management Date Initiated: 08/27/2019 Target Resolution Date: 09/28/2019 Goal Status: Active Interventions: Assess patient understanding of disease process and management upon diagnosis and as needed Assess peripheral arterial status upon admission and as needed Provide education on tissue oxygenation and ischemia Notes: Wound/Skin Impairment Nursing Diagnoses: Impaired tissue integrity Knowledge deficit related to ulceration/compromised skin integrity Goals: Patient/caregiver will verbalize understanding of skin  care regimen Date Initiated: 08/27/2019 Target Resolution Date: 09/28/2019 Goal Status: Active Ulcer/skin breakdown will have a volume reduction of 30% by week 4 Date Initiated: 08/27/2019 Target Resolution Date: 09/28/2019 Goal Status: Active Interventions: Assess patient/caregiver ability to obtain necessary supplies Assess patient/caregiver ability to perform ulcer/skin care regimen upon admission and as needed Assess ulceration(s) every visit Provide education on ulcer and skin care Notes: Electronic Signature(s) Signed: 08/27/2019 5:18:21 PM By: Zandra Abts RN, BSN Entered By: Zandra Abts on 08/27/2019 14:54:36 -------------------------------------------------------------------------------- Pain Assessment Details Patient Name: Date of Service: Matthew [name] 08/27/2019 1:15 PM Medical Record [id number] Patient Account Number: [id number] Date of Birth/Sex: Treating RN: 1950/11/01 (69 y.o. Matthew [name]) Matthew [name] Primary Care Kassity Woodson: PATIENT, NO Other Clinician: Referring Hallis Meditz: Treating Gibson Telleria/Extender:Robson, Lorenso Quarry, CAROLINE Weeks in Treatment: 0 Active Problems Location of Pain Severity and Description of Pain Patient Has Paino Yes Site Locations With Dressing Change: Yes Duration of the Pain. Constant / Intermittento Constant Rate the pain. Current Pain Level: 10 Worst Pain Level: 10 Least Pain Level: 3 Tolerable Pain Level: 5 Character of Pain Describe the Pain: Aching, Burning, Sharp, Throbbing Pain Management and Medication Current Pain Management: Medication: Yes Cold Application: No Rest: Yes Massage: No Activity: No McclureE.N.S.: No Heat Application: No Leg drop or elevation: No Is the Current Pain Management Adequate: Inadequate How does your wound impact your activities of daily  livingo Sleep: Yes Bathing: No Appetite: Yes Relationship With Others: No Bladder Continence: No Emotions: No Bowel Continence: No Work:  No Toileting: No Drive: No Dressing: No Hobbies: No Electronic Signature(s) Signed: 08/27/2019 5:05:11 PM By: Matthew PaxEpps, Carrie RN Entered By: Matthew PaxEpps, Carrie on 08/27/2019 14:10:08 -------------------------------------------------------------------------------- Patient/Caregiver Education Details Patient Name: Date of Service: Haislip, Vyom T. 3/22/2021andnbsp1:15 PM Medical Record [id number] Patient Account Number: [id number] Date of Birth/Gender: Treating RN: 09-21-1950 [age] y.o. [name]) Lynch, Shatara Primary Care Physician: PATIENT, NO Other Clinician: Referring Physician: Treating Physician/Extender:Robson, Lorenso QuarryMichael ABERMAN, Lujean AmelAROLINE Weeks in Treatment: 0 Education Assessment Education Provided To: Patient Education Topics Provided Safety: Methods: Explain/Verbal Responses: State content correctly Tissue Oxygenation: Methods: Explain/Verbal Responses: State content correctly Wound/Skin Impairment: Methods: Explain/Verbal Responses: State content correctly Electronic Signature(s) Signed: 08/27/2019 5:18:21 PM By: Zandra AbtsLynch, Shatara RN, BSN Entered By: Zandra AbtsLynch, Shatara on 08/27/2019 14:54:51 -------------------------------------------------------------------------------- Wound Assessment Details Patient Name: Date of Service: Franki MonteHAXTON, Jessen T. 08/27/2019 1:15 PM Medical Record [id number] Patient Account Number: [id number] Date of Birth/Sex: Treating RN: 09-21-1950 [age] y.o. [name]) Lynch, Shatara Primary Care Ervin Hensley: PATIENT, NO Other Clinician: Referring Jarryd Gratz: Treating Trista Ciocca/Extender:Robson, Lorenso QuarryMichael ABERMAN, CAROLINE Weeks in Treatment: 0 Wound Status Wound Number: 1 Primary Etiology: Arterial Insufficiency Ulcer Wound Location: Left Lower Leg - Anterior, Distal Wound Status: Open Wounding Event: Trauma Comorbid History: Peripheral Arterial Disease Date Acquired: 07/08/2018 Weeks Of Treatment: 0 Clustered Wound: No Wound Measurements Length: (cm) 10.5 %  Reduct Width: (cm) 2.5 % Reduct Depth: (cm) 0.2 Epitheli Area: (cm) 20.617 Tunneli Volume: (cm) 4.123 Undermi Wound Description Classification: Full Thickness Without Exposed Support Foul Odo Structures Slough/F Wound Flat and Intact Margin: Exudate Medium Amount: Exudate Serosanguineous Type: Exudate red, brown Color: Wound Bed Granulation Amount: Small (1-33%) Granulation Quality: Pink Fascia E Necrotic Amount: Large (67-100%) Fat Laye Necrotic Quality: Adherent Slough Tendon E Muscle E Joint Ex Bone Exp r After Cleansing: No ibrino Yes Exposed Structure xposed: No r (Subcutaneous Tissue) Exposed: Yes xposed: No xposed: No posed: No osed: No ion in Area: 0% ion in Volume: 0% alization: Small (1-33%) ng: No ning: No Electronic Signature(s) Signed: 08/27/2019 5:18:21 PM By: Zandra AbtsLynch, Shatara RN, BSN Entered By: Zandra AbtsLynch, Shatara on 08/27/2019 14:48:16 -------------------------------------------------------------------------------- Wound Assessment Details Patient Name: Date of Service: Franki MonteHAXTON, Donney T. 08/27/2019 1:15 PM Medical Record [id number] Patient Account Number: [id number] Date of Birth/Sex: Treating RN: 09-21-1950 [age] y.o. [name]) Lynch, Shatara Primary Care Roberta Angell: PATIENT, NO Other Clinician: Referring Deolinda Frid: Treating Jiselle Sheu/Extender:Robson, Lorenso QuarryMichael ABERMAN, CAROLINE Weeks in Treatment: 0 Wound Status Wound Number: 2 Primary Etiology: Arterial Insufficiency Ulcer Wound Location: Left Ankle - Lateral Wound Status: Open Wounding Event: Blister Comorbid History: Peripheral Arterial Disease Date Acquired: 08/06/2019 Weeks Of Treatment: 0 Clustered Wound: No Photos Wound Measurements Length: (cm) 0.5 % Reduct Width: (cm) 0.7 % Reduct Depth: (cm) 0.1 Epitheli Area: (cm) 0.275 Tunneli Volume: (cm) 0.027 Undermi Wound Description Classification: Full Thickness Without Exposed Support Foul Odo Structures Slough/F Exudate  Medium Amount: Exudate Serosanguineous Type: Exudate red, brown Color: Wound Bed Granulation Amount: None Present (0%) Necrotic Amount: Large (67-100%) Fascia E Necrotic Quality: Adherent Slough Fat Laye Tendon E Muscle E Joint Ex Bone Exp r After Cleansing: No ibrino Yes Exposed Structure xposed: No r (Subcutaneous Tissue) Exposed: No xposed: No xposed: No posed: No osed: No ion in Area: 0% ion in Volume: 0% alization: None ng: No ning: No Electronic Signature(s) Signed: 08/28/2019 3:59:58 PM By: Benjaman KindlerJones, Dedrick EMT/HBOT Signed: 09/19/2019 9:02:47 AM By: Zandra AbtsLynch, Shatara RN, BSN Previous Signature: 08/27/2019 5:05:11 PM Version By:  Matthew Pax RN Entered By: Benjaman Kindler on 08/28/2019 14:43:21 -------------------------------------------------------------------------------- Vitals Details Patient Name: Date of Service: WYMAN, MESCHKE 08/27/2019 1:15 PM Medical Record [id number] Patient Account Number: [id number] Date of Birth/Sex: Treating RN: Sep 03, 1950 (69 y.o. Matthew [name]) Matthew [name] Primary Care Anastasios Melander: PATIENT, NO Other Clinician: Referring Candon Caras: Treating Vaanya Shambaugh/Extender:Robson, Lorenso Quarry, CAROLINE Weeks in Treatment: 0 Vital Signs Time Taken: 13:35 Temperature (F): 98.4 Height (in): 72 Pulse (bpm): 93 Source: Stated Respiratory Rate (breaths/min): 18 Weight (lbs): 130 Blood Pressure (mmHg): 112/47 Source: Stated Reference Range: 80 - 120 mg / dl Body Mass Index (BMI): 17.6 Electronic Signature(s) Signed: 08/27/2019 5:05:11 PM By: Matthew Pax RN Entered By: Matthew [name] on 08/27/2019 13:35:58

## 2019-08-29 ENCOUNTER — Other Ambulatory Visit: Payer: Self-pay | Admitting: *Deleted

## 2019-08-29 DIAGNOSIS — I739 Peripheral vascular disease, unspecified: Secondary | ICD-10-CM

## 2019-08-30 ENCOUNTER — Other Ambulatory Visit: Payer: Self-pay

## 2019-08-30 ENCOUNTER — Ambulatory Visit (HOSPITAL_COMMUNITY)
Admission: RE | Admit: 2019-08-30 | Discharge: 2019-08-30 | Disposition: A | Discharge status: Home / Self Care | Payer: Medicare HMO | Source: Ambulatory Visit | Attending: Vascular Surgery | Admitting: Vascular Surgery

## 2019-08-30 ENCOUNTER — Encounter (HOSPITAL_COMMUNITY): Payer: Self-pay

## 2019-08-30 DIAGNOSIS — R6889 Other general symptoms and signs: Secondary | ICD-10-CM | POA: Diagnosis not present

## 2019-08-30 DIAGNOSIS — I739 Peripheral vascular disease, unspecified: Secondary | ICD-10-CM

## 2019-08-31 ENCOUNTER — Ambulatory Visit (HOSPITAL_COMMUNITY): Payer: Medicare HMO

## 2019-08-31 ENCOUNTER — Encounter: Payer: Medicare HMO | Admitting: Vascular Surgery

## 2019-09-03 ENCOUNTER — Encounter (HOSPITAL_BASED_OUTPATIENT_CLINIC_OR_DEPARTMENT_OTHER): Payer: Medicare HMO | Admitting: Internal Medicine

## 2019-09-03 ENCOUNTER — Other Ambulatory Visit: Payer: Self-pay

## 2019-09-03 DIAGNOSIS — I70243 Atherosclerosis of native arteries of left leg with ulceration of ankle: Secondary | ICD-10-CM | POA: Diagnosis not present

## 2019-09-03 DIAGNOSIS — R6889 Other general symptoms and signs: Secondary | ICD-10-CM | POA: Diagnosis not present

## 2019-09-03 DIAGNOSIS — L97322 Non-pressure chronic ulcer of left ankle with fat layer exposed: Secondary | ICD-10-CM | POA: Diagnosis not present

## 2019-09-03 DIAGNOSIS — S81812A Laceration without foreign body, left lower leg, initial encounter: Secondary | ICD-10-CM | POA: Diagnosis not present

## 2019-09-03 DIAGNOSIS — Z87891 Personal history of nicotine dependence: Secondary | ICD-10-CM | POA: Diagnosis not present

## 2019-09-03 DIAGNOSIS — L97822 Non-pressure chronic ulcer of other part of left lower leg with fat layer exposed: Secondary | ICD-10-CM | POA: Diagnosis not present

## 2019-09-03 DIAGNOSIS — L97828 Non-pressure chronic ulcer of other part of left lower leg with other specified severity: Secondary | ICD-10-CM | POA: Diagnosis not present

## 2019-09-03 DIAGNOSIS — I70242 Atherosclerosis of native arteries of left leg with ulceration of calf: Secondary | ICD-10-CM | POA: Diagnosis not present

## 2019-09-03 NOTE — Progress Notes (Signed)
LYNK, MARTI ([id number]) Visit Report for 09/03/2019 HPI Details Patient Name: Date of Service: Matthew [name], Matthew [name] 09/03/2019 1:45 PM Medical Record [id number] Patient Account Number: [id number] Date of Birth/Sex: Treating RN: [birth year] (69 y.o. [name] Primary Care Provider: PATIENT, NO Other Clinician: Referring Provider: Treating Provider/Extender:Tamotsu Wiederholt, Lorenso Quarry, CAROLINE Weeks in Treatment: 1 History of Present Illness HPI Description: ADMISSION 08/27/2019 This is a 69 year old man who tells me that he fell going up stairs about a year ago resulting in a deep laceration type wound on the left anterior mid tibia. He was soon found to have significant PAD/critical limb ischemia. He had had previous noninvasive studies in 2017 showing an ABI of 0.82 on the right and 0.91 on the left with monophasic to biphasic waveforms. On 07/18/2019 his ABI in the right was 0.58 on the left 0.52 with TBI's of 0.51 on the right and 0 on the left. He underwent an angiogram by Dr. Randie Heinz on 3/1. He underwent bilateral iliac artery stents he was also discovered to have a left-sided SFA with it was patent up to 10 minutes centimeters proximal to the adductor canal but then became stenotic with a 95% stenosis for 15 cm. Dominant runoff is via the posterior tibial artery. The patient still has the wound on the left anterior mid tibia. He has been using Vaseline to this. He wraps this and gauze and some form of Ace wrap. He complains bitterly of the pain also complains about falling with the leg giving out on him. About 2 weeks ago he developed a new wound on the left lateral ankle. He had a DVT rule out study on 3/16/ 1 that was negative for a DVT Past medical history includes cervical spondylosis, left carotid bruit, marked PAD. ABI in our clinic was 0.71 on the left 3/29; patient admitted to the clinic last week with a laceration type injury on the left anterior mid tibia in the  setting of severe PAD. He was supposed to follow-up with Dr. Randie Heinz last week but he tells me he had to cancel the appointment because of nausea and vomiting. I have encouraged him to rebook this ASAP. He tolerated our kerlix Coban wrap without incidence that the pain was better. We used Iodoflex to the wound area Electronic Signature(s) Signed: 09/03/2019 5:31:36 PM By: Baltazar Najjar MD Entered By: Baltazar Najjar on 09/03/2019 14:48:38 -------------------------------------------------------------------------------- Physical Exam Details Patient Name: Date of Service: Matthew [name] 09/03/2019 1:45 PM Medical Record [id number] Patient Account Number: [id number] Date of Birth/Sex: Treating RN: 09/16/1950 (69 y.o. [name] Primary Care Provider: Other Clinician: PATIENT, NO Referring Provider: Treating Provider/Extender:Tahjir Silveria, Lorenso Quarry, CAROLINE Weeks in Treatment: 1 Constitutional Patient is hypertensive.. Pulse regular and within target range for patient.Marland Kitchen Respirations regular, non-labored and within target range.. Temperature is normal and within the target range for the patient.Marland Kitchen Appears in no distress. Respiratory work of breathing is normal. Cardiovascular Pedal pulses are not palpable on the left. Integumentary (Hair, Skin) No erythema around the wound. Psychiatric appears at normal baseline. Notes Wound exam; the patient has a long wound in the anterior mid tibia. Less necrotic debris than last time however some is still present. No mechanical debridement secondary to known severe PAD. Electronic Signature(s) Signed: 09/03/2019 5:31:36 PM By: Baltazar Najjar MD Entered By: Baltazar Najjar on 09/03/2019 14:50:18 -------------------------------------------------------------------------------- Physician Orders Details Patient Name: Date of Service: THINH, CUCCARO 09/03/2019 1:45 PM Medical Record [id number] Patient Account Number:  [id number] Date of Birth/Sex: Treating RN: [birth year] (69  y.o. 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 Dressing Change Frequency Wound #1 Left,Distal,Anterior Lower Leg Do not change entire dressing for one week. Wound #2 Left,Lateral Ankle Do not change entire dressing for one week. Skin Barriers/Peri-Wound Care Barrier cream Moisturizing lotion TCA Cream or Ointment - mix with lotion Wound Cleansing May shower with protection. - use cast protector Primary Wound Dressing Wound #1 Left,Distal,Anterior Lower Leg Iodoflex Wound #2 Left,Lateral Ankle Iodoflex Secondary Dressing Wound #1 Left,Distal,Anterior Lower Leg Dry Gauze ABD pad Wound #2 Left,Lateral Ankle Dry Gauze ABD pad Edema Control Kerlix and Coban - Left Lower Extremity - ****LIGHTLY WRAP**** Avoid standing for long periods of time Elevate legs to the level of the heart or above for 30 minutes daily and/or when sitting, a frequency of: - throughout the day Additional Orders / Instructions Other: - Be sure to reschedule appointment with Dr. Donzetta Matters ASAP Electronic Signature(s) Signed: 09/03/2019 5:31:36 PM By: Linton Ham MD Signed: 09/03/2019 5:31:53 PM By: Baruch Gouty RN, BSN Entered By: Baruch Gouty on 09/03/2019 14:36:25 -------------------------------------------------------------------------------- Problem List  Details Patient Name: Date of Service: Matthew Amass T. 09/03/2019 1:45 PM Medical Record [id number] Patient Account Number: [id number] Date of Birth/Sex: Treating RN: 1951-03-23 (69 y.o. Ernestene Mention Primary Care Provider: PATIENT, NO Other Clinician: Referring Provider: Treating Provider/Extender:Tiwanda Threats, Pablo Ledger, CAROLINE Weeks in Treatment: 1 Active Problems ICD-10 Evaluated Encounter Code Description Active Date Code Description Active Date Today Diagnosis I70.242 Atherosclerosis of native arteries of left leg with 08/27/2019 No Yes ulceration of calf I70.243 Atherosclerosis of native arteries of left leg with 08/27/2019 No Yes ulceration of ankle L97.828 Non-pressure chronic ulcer of other part of left lower 08/27/2019 No Yes leg with other specified severity L97.322 Non-pressure chronic ulcer of left ankle with fat layer 08/27/2019 No Yes exposed Inactive Problems Resolved Problems Electronic Signature(s) Signed: 09/03/2019 5:31:36 PM By: Linton Ham MD Entered By: Linton Ham on 09/03/2019 14:45:25 -------------------------------------------------------------------------------- Progress Note Details Patient Name: Date of Service: [name] 09/03/2019 1:45 PM Medical Record [id number] Patient Account Number: [id number] Date of Birth/Sex: Treating RN: 03/20/1951 (69 y.o. [name] Primary Care Provider: PATIENT, NO Other Clinician: Referring Provider: Treating Provider/Extender:Samiksha Pellicano, Pablo Ledger, CAROLINE Weeks in Treatment: 1 Subjective History of Present Illness (HPI) ADMISSION 08/27/2019 This is a 69 year old man who tells me that he fell going up stairs about a year ago resulting in a deep laceration type wound on the left anterior mid tibia. He was soon found to have significant PAD/critical limb ischemia. He had had previous noninvasive studies in 2017 showing an ABI of 0.82 on the right and 0.91 on the left  with monophasic to biphasic waveforms. On 07/18/2019 his ABI in the right was 0.58 on the left 0.52 with TBI's of 0.51 on the right and 0 on the left. He underwent an angiogram by Dr. Donzetta Matters on 3/1. He underwent bilateral iliac artery stents he was also discovered to have a left-sided SFA with it was patent up to 10 minutes centimeters proximal to the adductor canal  but then became stenotic with a 95% stenosis for 15 cm. Dominant runoff is via the posterior tibial artery. The patient still has the wound on the left anterior mid tibia. He has been using Vaseline to this. He wraps this and gauze and some form of Ace wrap. He complains bitterly of the pain also complains about falling with the leg giving out on him. About 2 weeks ago he developed a new wound on the left lateral ankle. He had a DVT rule out study on 3/16/ 1 that was negative for a DVT Past medical history includes cervical spondylosis, left carotid bruit, marked PAD. ABI in our clinic was 0.71 on the left 3/29; patient admitted to the clinic last week with a laceration type injury on the left anterior mid tibia in the setting of severe PAD. He was supposed to follow-up with Dr. Randie Heinz last week but he tells me he had to cancel the appointment because of nausea and vomiting. I have encouraged him to rebook this ASAP. He tolerated our kerlix Coban wrap without incidence that the pain was better. We used Iodoflex to the wound area Objective Constitutional Patient is hypertensive.. Pulse regular and within target range for patient.Marland Kitchen Respirations regular, non-labored and within target range.. Temperature is normal and within the target range for the patient.Marland Kitchen Appears in no distress. Vitals Time Taken: 1:47 PM, Height: 72 in, Weight: 130 lbs, BMI: 17.6, Temperature: 98.0 F, Pulse: 77 bpm, Respiratory Rate: 18 breaths/min, Blood Pressure: 154/63 mmHg. Respiratory work of breathing is normal. Cardiovascular Pedal pulses are not palpable on  the left. Psychiatric appears at normal baseline. General Notes: Wound exam; the patient has a long wound in the anterior mid tibia. Less necrotic debris than last time however some is still present. No mechanical debridement secondary to known severe PAD. Integumentary (Hair, Skin) No erythema around the wound. Wound #1 status is Open. Original cause of wound was Trauma. The wound is located on the [facility] Lower Leg. The wound measures 10.5cm length x 3cm width x 0.2cm depth; 24.74cm^2 area and 4.948cm^3 volume. There is Fat Layer (Subcutaneous Tissue) Exposed exposed. There is no tunneling or undermining noted. There is a medium amount of serosanguineous drainage noted. The wound margin is flat and intact. There is small (1-33%) pink granulation within the wound bed. There is a large (67-100%) amount of necrotic tissue within the wound bed including Adherent Slough. Wound #2 status is Open. Original cause of wound was Blister. The wound is located on the Left,Lateral Ankle. The wound measures 0.6cm length x 0.9cm width x 0.1cm depth; 0.424cm^2 area and 0.042cm^3 volume. There is no tunneling or undermining noted. There is a medium amount of serosanguineous drainage noted. There is no granulation within the wound bed. There is a large (67-100%) amount of necrotic tissue within the wound bed including Adherent Slough. 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 Dressing Change Frequency: Wound #1 Left,Distal,Anterior Lower Leg: Do not change entire dressing for one week. Wound #2 Left,Lateral Ankle: Do not change entire dressing for  one week. Skin Barriers/Peri-Wound Care: Barrier cream Moisturizing lotion TCA Cream or Ointment - mix with lotion Wound Cleansing: May shower with protection. - use cast protector Primary  Wound Dressing: Wound #1 Left,Distal,Anterior Lower Leg: Iodoflex Wound #2 Left,Lateral Ankle: Iodoflex Secondary Dressing: Wound #1 Left,Distal,Anterior Lower Leg: Dry Gauze ABD pad Wound #2 Left,Lateral Ankle: Dry Gauze ABD pad Edema Control: Kerlix and Coban - Left Lower Extremity - ****LIGHTLY WRAP**** Avoid standing for long periods of time Elevate legs to the level of the heart or above for 30 minutes daily and/or when sitting, a frequency of: - throughout the day Additional Orders / Instructions: Other: - Be sure to reschedule appointment with Dr. Randie Heinz ASAP 1. I am going to continue with Iodoflex under Curlex Coban 2. He is leaving this on all week 3. I have encouraged him to rebook an appointment with Dr. Randie Heinz. I emphasized this several times Electronic Signature(s) Signed: 09/03/2019 5:31:36 PM By: Baltazar Najjar MD Entered By: Baltazar Najjar on 09/03/2019 14:51:02 -------------------------------------------------------------------------------- SuperBill Details Patient Name: Date of Service: Matthew [name] 09/03/2019 Medical Record [id number] Patient Account Number: [id number] Date of Birth/Sex: Treating RN: July 10, 1950 (69 y.o. 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 L97.828 Non-pressure chronic ulcer of other part of left lower leg with other specified severity L97.322 Non-pressure chronic ulcer of left ankle with fat layer  exposed Facility Procedures CPT4 Code: [id number] Description: 99214 - WOUND CARE VISIT-LEV 4 EST PT Modifier: Quantity: 1 Physician Procedures CPT4 Code Description: [id number] 99213 - WC PHYS LEVEL 3 - EST PT ICD-10 Diagnosis Description I70.242 Atherosclerosis of native arteries of left leg with ulcer I70.243 Atherosclerosis of native arteries of left leg with ulcer L97.828 Non-pressure chronic  ulcer of other part of left lower le severity L97.322 Non-pressure chronic ulcer of left ankle with fat layer e Modifier: ation of calf ation of ankle g with other speci xposed Quantity: 1 fied Electronic Signature(s) Signed: 09/03/2019 5:31:36 PM By: Baltazar Najjar MD Entered By: Baltazar Najjar on 09/03/2019 14:51:28

## 2019-09-05 ENCOUNTER — Telehealth: Payer: Self-pay

## 2019-09-05 NOTE — Telephone Encounter (Signed)
Pt called to schedule f/u. He was offered tomorrow morning but is unable to make it due to transportation issues. He uses [employer] transportation and needs 3 days notice. We are working on scheduling this for the near future.

## 2019-09-10 ENCOUNTER — Encounter (HOSPITAL_BASED_OUTPATIENT_CLINIC_OR_DEPARTMENT_OTHER): Payer: Medicare HMO | Attending: Internal Medicine | Admitting: Internal Medicine

## 2019-09-10 ENCOUNTER — Other Ambulatory Visit: Payer: Self-pay

## 2019-09-10 DIAGNOSIS — L97322 Non-pressure chronic ulcer of left ankle with fat layer exposed: Secondary | ICD-10-CM | POA: Diagnosis not present

## 2019-09-10 DIAGNOSIS — I70243 Atherosclerosis of native arteries of left leg with ulceration of ankle: Secondary | ICD-10-CM | POA: Diagnosis not present

## 2019-09-10 DIAGNOSIS — L97822 Non-pressure chronic ulcer of other part of left lower leg with fat layer exposed: Secondary | ICD-10-CM | POA: Diagnosis not present

## 2019-09-10 DIAGNOSIS — I70248 Atherosclerosis of native arteries of left leg with ulceration of other part of lower left leg: Secondary | ICD-10-CM | POA: Insufficient documentation

## 2019-09-10 DIAGNOSIS — R6889 Other general symptoms and signs: Secondary | ICD-10-CM | POA: Diagnosis not present

## 2019-09-10 DIAGNOSIS — I70242 Atherosclerosis of native arteries of left leg with ulceration of calf: Secondary | ICD-10-CM | POA: Diagnosis not present

## 2019-09-10 DIAGNOSIS — L97828 Non-pressure chronic ulcer of other part of left lower leg with other specified severity: Secondary | ICD-10-CM | POA: Diagnosis not present

## 2019-09-10 NOTE — Progress Notes (Signed)
JACQUE, GARRELS ([id number]) Visit Report for 09/10/2019 HPI Details Patient Name: Date of Service: ESCO, JOSLYN 09/10/2019 2:45 PM Medical Record [id number] Patient Account Number: [id number] Date of Birth/Sex: Treating RN: [birth year] (69 y.o. [name] Primary Care Provider: PATIENT, NO Other Clinician: Referring Provider: Treating Provider/Extender:Iver Fehrenbach, Lorenso Quarry, CAROLINE Weeks in Treatment: 2 History of Present Illness HPI Description: ADMISSION 08/27/2019 This is a 69 year old man who tells me that he fell going up stairs about a year ago resulting in a deep laceration type wound on the left anterior mid tibia. He was soon found to have significant PAD/critical limb ischemia. He had had previous noninvasive studies in 2017 showing an ABI of 0.82 on the right and 0.91 on the left with monophasic to biphasic waveforms. On 07/18/2019 his ABI in the right was 0.58 on the left 0.52 with TBI's of 0.51 on the right and 0 on the left. He underwent an angiogram by Dr. Randie Heinz on 3/1. He underwent bilateral iliac artery stents he was also discovered to have a left-sided SFA with it was patent up to 10 minutes centimeters proximal to the adductor canal but then became stenotic with a 95% stenosis for 15 cm. Dominant runoff is via the posterior tibial artery. The patient still has the wound on the left anterior mid tibia. He has been using Vaseline to this. He wraps this and gauze and some form of Ace wrap. He complains bitterly of the pain also complains about falling with the leg giving out on him. About 2 weeks ago he developed a new wound on the left lateral ankle. He had a DVT rule out study on 3/16/ 1 that was negative for a DVT Past medical history includes cervical spondylosis, left carotid bruit, marked PAD. ABI in our clinic was 0.71 on the left 3/29; patient admitted to the clinic last week with a laceration type injury on the left anterior mid tibia in the  setting of severe PAD. He was supposed to follow-up with Dr. Randie Heinz last week but he tells me he had to cancel the appointment because of nausea and vomiting. I have encouraged him to rebook this ASAP. He tolerated our kerlix Coban wrap without incidence that the pain was better. We used Iodoflex to the wound area 4/5; working on getting him back in with Dr. Randie Heinz. We have been using Iodoflex under Curlex and Coban. He seems to be tolerating this Electronic Signature(s) Signed: 09/10/2019 5:24:04 PM By: Baltazar Najjar MD Entered By: Baltazar Najjar on 09/10/2019 16:28:29 -------------------------------------------------------------------------------- Physical Exam Details Patient Name: Date of Service: [name] 09/10/2019 2:45 PM Medical Record [id number] Patient Account Number: [id number] Date of Birth/Sex: Treating RN: 1951/03/14 (69 y.o. [name] Primary Care Provider: PATIENT, NO Other Clinician: Referring Provider: Treating Provider/Extender:Blade Scheff, Lorenso Quarry, CAROLINE Weeks in Treatment: 2 Constitutional Patient is hypertensive.. Pulse regular and within target range for patient.Marland Kitchen Respirations regular, non-labored and within target range.. Temperature is normal and within the target range for the patient.Marland Kitchen Appears in no distress. Respiratory work of breathing is normal. Cardiovascular Pedal pulses are not palpable his feet look ischemic. Integumentary (Hair, Skin) No erythema around the wound. Psychiatric appears at normal baseline. Notes Wound exam; patient has a long laceration wound in the anterior mid tibia. He has less necrotic debris. I washed this off with wound cleanser and he tolerates this very poorly. I have been reluctant to do mechanical debridement until worse more certain about his arterial status whether anything can be else can  be done here. Electronic Signature(s) Signed: 09/10/2019 5:24:04 PM By: Linton Ham MD Entered By:  Linton Ham on 09/10/2019 16:29:59 -------------------------------------------------------------------------------- Physician Orders Details Patient Name: Date of Service: [name] 09/10/2019 2:45 PM Medical Record [id number] Patient Account Number: [id number] Date of Birth/Sex: Treating RN: [birth year] (69 y.o. 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 Dressing Change Frequency Wound #1 Left,Distal,Anterior Lower Leg Do not change entire dressing for one week. Wound #2 Left,Lateral Ankle Do not change entire dressing for one week. Skin Barriers/Peri-Wound Care Barrier cream Moisturizing lotion TCA Cream or Ointment - mix with lotion Wound Cleansing May shower with protection. - use cast protector Primary Wound Dressing Wound #1 Left,Distal,Anterior Lower Leg Iodoflex Wound #2 Left,Lateral Ankle Iodoflex Secondary Dressing Wound #1 Left,Distal,Anterior Lower Leg Dry Gauze ABD pad Wound #2 Left,Lateral Ankle Dry Gauze ABD pad Edema Control Kerlix and Coban - Left Lower Extremity - ****LIGHTLY WRAP**** Avoid standing for long periods of time Elevate legs to the level of the heart or above for 30 minutes daily and/or when sitting, a frequency of: - throughout the day Additional Orders / Instructions Other: - Be sure to reschedule appointment with Dr. Donzetta Matters ASAP Electronic  Signature(s) Signed: 09/10/2019 5:09:30 PM By: Levan Hurst RN, BSN Signed: 09/10/2019 5:24:04 PM By: Linton Ham MD Entered By: Levan Hurst on 09/10/2019 15:24:36 -------------------------------------------------------------------------------- Problem List Details Patient Name: Date of Service: [name]. 09/10/2019 2:45 PM Medical Record [id number] Patient Account Number: [id number] Date of Birth/Sex: Treating RN: 07-05-1950 (69 y.o. [name] Primary Care Provider: PATIENT, NO Other Clinician: Referring Provider: Treating Provider/Extender:Rika Daughdrill, Pablo Ledger, CAROLINE Weeks in Treatment: 2 Active Problems ICD-10 Evaluated Encounter Code Description Active Date Today Diagnosis I70.242 Atherosclerosis of native arteries of left leg with 08/27/2019 No Yes ulceration of calf I70.243 Atherosclerosis of native arteries of left leg with 08/27/2019 No Yes ulceration of ankle L97.828 Non-pressure chronic ulcer of other part of left lower 08/27/2019 No Yes leg with other specified severity L97.322 Non-pressure chronic ulcer of left ankle with fat layer 08/27/2019 No Yes exposed Inactive Problems Resolved Problems Electronic Signature(s) Signed: 09/10/2019 5:24:04 PM By: Linton Ham MD Entered By: Linton Ham on 09/10/2019 16:27:36 -------------------------------------------------------------------------------- Progress Note Details Patient Name: Date of Service: [name] 09/10/2019 2:45 PM Medical Record [id number] Patient Account Number: [id number] Date of Birth/Sex: Treating RN: 1950-07-16 (69 y.o. [name] Primary Care Provider: PATIENT, NO Other Clinician: Referring Provider: Treating Provider/Extender:Dashanae Longfield, Pablo Ledger, CAROLINE Weeks in Treatment: 2 Subjective History of Present Illness (HPI) ADMISSION 08/27/2019 This is a 69 year old man who tells me that he fell going up stairs about a year ago resulting in  a deep laceration type wound on the left anterior mid tibia. He was soon found to have significant PAD/critical limb ischemia. He had had previous noninvasive studies in 2017 showing an ABI of 0.82 on the right and 0.91 on the left with monophasic to biphasic waveforms. On 07/18/2019 his ABI in the right was 0.58 on the left 0.52 with TBI's of 0.51  on the right and 0 on the left. He underwent an angiogram by Dr. Randie Heinz on 3/1. He underwent bilateral iliac artery stents he was also discovered to have a left-sided SFA with it was patent up to 10 minutes centimeters proximal to the adductor canal but then became stenotic with a 95% stenosis for 15 cm. Dominant runoff is via the posterior tibial artery. The patient still has the wound on the left anterior mid tibia. He has been using Vaseline to this. He wraps this and gauze and some form of Ace wrap. He complains bitterly of the pain also complains about falling with the leg giving out on him. About 2 weeks ago he developed a new wound on the left lateral ankle. He had a DVT rule out study on 3/16/ 1 that was negative for a DVT Past medical history includes cervical spondylosis, left carotid bruit, marked PAD. ABI in our clinic was 0.71 on the left 3/29; patient admitted to the clinic last week with a laceration type injury on the left anterior mid tibia in the setting of severe PAD. He was supposed to follow-up with Dr. Randie Heinz last week but he tells me he had to cancel the appointment because of nausea and vomiting. I have encouraged him to rebook this ASAP. He tolerated our kerlix Coban wrap without incidence that the pain was better. We used Iodoflex to the wound area 4/5; working on getting him back in with Dr. Randie Heinz. We have been using Iodoflex under Curlex and Coban. He seems to be tolerating this Objective Constitutional Patient is hypertensive.. Pulse regular and within target range for patient.Marland Kitchen Respirations regular, non-labored and within  target range.. Temperature is normal and within the target range for the patient.Marland Kitchen Appears in no distress. Vitals Time Taken: 2:23 PM, Height: 72 in, Weight: 130 lbs, BMI: 17.6, Temperature: 98.1 F, Pulse: 80 bpm, Respiratory Rate: 18 breaths/min, Blood Pressure: 145/88 mmHg. Respiratory work of breathing is normal. Cardiovascular Pedal pulses are not palpable his feet look ischemic. Psychiatric appears at normal baseline. General Notes: Wound exam; patient has a long laceration wound in the anterior mid tibia. He has less necrotic debris. I washed this off with wound cleanser and he tolerates this very poorly. I have been reluctant to do mechanical debridement until worse more certain about his arterial status whether anything can be else can be done here. Integumentary (Hair, Skin) No erythema around the wound. Wound #1 status is Open. Original cause of wound was Trauma. The wound is located on the [facility] Lower Leg. The wound measures 10.2cm length x 2.7cm width x 0.3cm depth; 21.63cm^2 area and 6.489cm^3 volume. There is Fat Layer (Subcutaneous Tissue) Exposed exposed. There is no tunneling or undermining noted. There is a medium amount of serosanguineous drainage noted. The wound margin is well defined and not attached to the wound base. There is small (1-33%) pink granulation within the wound bed. There is a large (67-100%) amount of necrotic tissue within the wound bed including Eschar and Adherent Slough. Wound #2 status is Open. Original cause of wound was Blister. The wound is located on the Left,Lateral Ankle. The wound measures 0.5cm length x 1cm width x 0.1cm depth; 0.393cm^2 area and 0.039cm^3 volume. There is no tunneling or undermining noted. There is a medium amount of serosanguineous drainage noted. There is no granulation within the wound bed. There is a large (67-100%) amount of necrotic tissue within the wound bed including Eschar and Adherent  Slough. 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 Dressing Change Frequency: Wound #1 Left,Distal,Anterior Lower Leg: Do not change entire dressing for one week. Wound #2 Left,Lateral Ankle: Do not change entire dressing for one week. Skin Barriers/Peri-Wound Care: Barrier cream Moisturizing lotion TCA Cream or Ointment - mix with lotion Wound Cleansing: May shower with protection. - use cast protector Primary Wound Dressing: Wound #1 Left,Distal,Anterior Lower Leg: Iodoflex Wound #2 Left,Lateral Ankle: Iodoflex Secondary Dressing: Wound #1 Left,Distal,Anterior Lower Leg: Dry Gauze ABD pad Wound #2 Left,Lateral Ankle: Dry Gauze ABD pad Edema Control: Kerlix and Coban - Left Lower Extremity - ****LIGHTLY WRAP**** Avoid standing for long periods of time Elevate legs to the level of the heart or above for 30 minutes daily and/or when sitting, a frequency of: - throughout the day Additional Orders / Instructions: Other: - Be sure to reschedule appointment with Dr. Randie Heinz ASAP 1. I am continuing with the Iodoflex for debridement purposes 2. He tolerates the kerlix/Coban compression 3. I would like him to follow-up with Dr. Randie Heinz. See if anything further can be done with regards to his vascular status. He will need follow-up arterial studies Electronic Signature(s) Signed: 09/10/2019 5:24:04 PM By: Baltazar Najjar MD Entered By: Baltazar Najjar on 09/10/2019 16:31:21 -------------------------------------------------------------------------------- SuperBill Details Patient Name: Date of Service: [name] 09/10/2019 Medical  Record [id number] Patient Account Number: [id number] Date of Birth/Sex: Treating RN: [birth year] (69 y.o. 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 L97.828 Non-pressure chronic ulcer of other part of left lower leg with other specified severity L97.322 Non-pressure chronic ulcer of left ankle with fat layer exposed Facility Procedures CPT4 Code: [id number] Description: 99214 - WOUND CARE VISIT-LEV 4 EST PT Modifier: Quantity: 1 Physician Procedures CPT4 Code Description: 1884166 99213 - WC PHYS LEVEL 3 - EST PT ICD-10 Diagnosis Description I70.243 Atherosclerosis of native arteries of left leg with ulcerati L97.828 Non-pressure chronic ulcer of other part of left lower leg w severity Modifier: on of ankle ith other specifie Quantity: 1 d Electronic Signature(s) Signed: 09/10/2019 5:09:30 PM By: Zandra Abts RN, BSN Signed: 09/10/2019 5:24:04 PM By: Baltazar Najjar MD Entered By: Zandra Abts on 09/10/2019 17:00:57

## 2019-09-17 ENCOUNTER — Encounter (HOSPITAL_BASED_OUTPATIENT_CLINIC_OR_DEPARTMENT_OTHER): Payer: Medicare HMO | Admitting: Internal Medicine

## 2019-09-17 ENCOUNTER — Other Ambulatory Visit: Payer: Self-pay

## 2019-09-17 DIAGNOSIS — L97822 Non-pressure chronic ulcer of other part of left lower leg with fat layer exposed: Secondary | ICD-10-CM | POA: Diagnosis not present

## 2019-09-17 DIAGNOSIS — R6889 Other general symptoms and signs: Secondary | ICD-10-CM | POA: Diagnosis not present

## 2019-09-17 DIAGNOSIS — L97322 Non-pressure chronic ulcer of left ankle with fat layer exposed: Secondary | ICD-10-CM | POA: Diagnosis not present

## 2019-09-17 DIAGNOSIS — I70242 Atherosclerosis of native arteries of left leg with ulceration of calf: Secondary | ICD-10-CM | POA: Diagnosis not present

## 2019-09-17 DIAGNOSIS — I70243 Atherosclerosis of native arteries of left leg with ulceration of ankle: Secondary | ICD-10-CM | POA: Diagnosis not present

## 2019-09-17 DIAGNOSIS — L97828 Non-pressure chronic ulcer of other part of left lower leg with other specified severity: Secondary | ICD-10-CM | POA: Diagnosis not present

## 2019-09-17 DIAGNOSIS — I70248 Atherosclerosis of native arteries of left leg with ulceration of other part of lower left leg: Secondary | ICD-10-CM | POA: Diagnosis not present

## 2019-09-17 NOTE — Progress Notes (Addendum)
Matthew, [name] ([id number]) Visit Report for 09/17/2019 Arrival Information Details Patient Name: Date of Service: Matthew [name], Matthew [name] 09/17/2019 2:45 PM Medical Record [id number] Patient Account Number: [id number] Date of Birth/Sex: Treating RN: 1950/07/07 (69 y.o. Marvis Repress Primary Care Johnnisha Forton: PATIENT, NO Other Clinician: Referring Sariah Henkin: Treating Veora Fonte/Extender:Robson, Pablo Ledger, CAROLINE Weeks in Treatment: 3 Visit Information History Since Last Visit Cane Added or deleted any medications: No Patient Arrived: 14:56 Any new allergies or adverse reactions: No Arrival Time: Had a fall or experienced change in No Accompanied By: self None activities of daily living that may affect Transfer Assistance: risk of falls: Patient Identification Verified: Yes Signs or symptoms of abuse/neglect since last No Secondary Verification Process Completed: Yes visito Patient Requires Transmission-Based No Hospitalized since last visit: No Precautions: Implantable device outside of the clinic excluding No Patient Has Alerts: No cellular tissue based products placed in the center since last visit: Has Dressing in Place as Prescribed: Yes Has Compression in Place as Prescribed: Yes Pain Present Now: Yes Electronic Signature(s) Signed: 09/17/2019 5:40:42 PM By: Kela Millin Entered By: Kela Millin on 09/17/2019 14:59:04 -------------------------------------------------------------------------------- Clinic Level of Care Assessment Details Patient Name: Date of Service: Matthew, [name] 09/17/2019 2:45 PM Medical Record [id number] Patient Account Number: [id number] Date of Birth/Sex: Treating RN: 01-29-1951 (69 y.o. [name] Primary Care Dorie Ohms: PATIENT, NO Other Clinician: Referring Liyanna Cartwright: Treating Novalie Leamy/Extender:Robson, Pablo Ledger, CAROLINE Weeks in Treatment: 3 Clinic Level of Care Assessment Items TOOL 4  Quantity Score X - Use when only an EandM is performed on FOLLOW-UP visit 1 0 ASSESSMENTS - Nursing Assessment / Reassessment X - Reassessment of Co-morbidities (includes updates in patient status) 1 10 X - Reassessment of Adherence to Treatment Plan 1 5 ASSESSMENTS - Wound and Skin Assessment / Reassessment '[]'  - Simple Wound Assessment / Reassessment - one wound 0 X - Complex Wound Assessment / Reassessment - multiple wounds 3 5 '[]'  - Dermatologic / Skin Assessment (not related to wound area) 0 ASSESSMENTS - Focused Assessment '[]'  - Circumferential Edema Measurements - multi extremities 0 '[]'  - Nutritional Assessment / Counseling / Intervention 0 X - Lower Extremity Assessment (monofilament, tuning fork, pulses) 1 5 '[]'  - Peripheral Arterial Disease Assessment (using hand held doppler) 0 ASSESSMENTS - Ostomy and/or Continence Assessment and Care '[]'  - Incontinence Assessment and Management 0 '[]'  - Ostomy Care Assessment and Management (repouching, etc.) 0 PROCESS - Coordination of Care X - Simple Patient / Family Education for ongoing care 1 15 '[]'  - Complex (extensive) Patient / Family Education for ongoing care 0 X - Staff obtains Programmer, systems, Records, Test Results / Process Orders 1 10 '[]'  - Staff telephones HHA, Nursing Homes / Clarify orders / etc 0 '[]'  - Routine Transfer to another Facility (non-emergent condition) 0 '[]'  - Routine Hospital Admission (non-emergent condition) 0 '[]'  - New Admissions / Biomedical engineer / Ordering NPWT, Apligraf, etc. 0 '[]'  - Emergency Hospital Admission (emergent condition) 0 X - Simple Discharge Coordination 1 10 '[]'  - Complex (extensive) Discharge Coordination 0 PROCESS - Special Needs '[]'  - Pediatric / Minor Patient Management 0 '[]'  - Isolation Patient Management 0 '[]'  - Hearing / Language / Visual special needs 0 '[]'  - Assessment of Community assistance (transportation, D/C planning, etc.) 0 '[]'  - Additional assistance / Altered mentation 0 '[]'  - Support  Surface(s) Assessment (bed, cushion, seat, etc.) 0 INTERVENTIONS - Wound Cleansing / Measurement '[]'  - Simple Wound Cleansing - one wound 0 X - Complex Wound Cleansing - multiple wounds  3 5 X - Wound Imaging (photographs - any number of wounds) 1 5 '[]'  - Wound Tracing (instead of photographs) 0 '[]'  - Simple Wound Measurement - one wound 0 X - Complex Wound Measurement - multiple wounds 3 5 INTERVENTIONS - Wound Dressings '[]'  - Small Wound Dressing one or multiple wounds 0 '[]'  - Medium Wound Dressing one or multiple wounds 0 X - Large Wound Dressing one or multiple wounds 1 20 X - Application of Medications - topical 1 5 '[]'  - Application of Medications - injection 0 INTERVENTIONS - Miscellaneous '[]'  - External ear exam 0 '[]'  - Specimen Collection (cultures, biopsies, blood, body fluids, etc.) 0 '[]'  - Specimen(s) / Culture(s) sent or taken to Lab for analysis 0 '[]'  - Patient Transfer (multiple staff / Civil Service fast streamer / Similar devices) 0 '[]'  - Simple Staple / Suture removal (25 or less) 0 '[]'  - Complex Staple / Suture removal (26 or more) 0 '[]'  - Hypo / Hyperglycemic Management (close monitor of Blood Glucose) 0 '[]'  - Ankle / Brachial Index (ABI) - do not check if billed separately 0 X - Vital Signs 1 5 Has the patient been seen at the hospital within the last three years: Yes Total Score: 135 Level Of Care: New/Established - Level 4 Electronic Signature(s) Signed: 09/17/2019 6:12:10 PM By: Levan Hurst RN, BSN Entered By: Levan Hurst on 09/17/2019 18:05:36 -------------------------------------------------------------------------------- Encounter Discharge Information Details Patient Name: Date of Service: Matthew Amass T. 09/17/2019 2:45 PM Medical Record [id number] Patient Account Number: [id number] Date of Birth/Sex: Treating RN: May 13, 1951 (69 y.o. Hessie Diener Primary Care Dung Prien: PATIENT, NO Other Clinician: Referring Cordera Stineman: Treating Dannon Nguyenthi/Extender:Robson,  Pablo Ledger, CAROLINE Weeks in Treatment: 3 Encounter Discharge Information Items Discharge Condition: Stable Ambulatory Status: Cane Discharge Destination: Home Transportation: Private Auto Accompanied By: self Schedule Follow-up Appointment: Yes Clinical Summary of Care: Electronic Signature(s) Signed: 09/17/2019 5:40:16 PM By: Deon Pilling Entered By: Deon Pilling on 09/17/2019 16:10:04 -------------------------------------------------------------------------------- Lower Extremity Assessment Details Patient Name: Date of Service: ADISA, LITT 09/17/2019 2:45 PM Medical Record [id number] Patient Account Number: [id number] Date of Birth/Sex: Treating RN: 12-26-1950 (69 y.o. Marvis Repress Primary Care Bryannah Boston: PATIENT, NO Other Clinician: Referring Doreene Forrey: Treating Donnavin Vandenbrink/Extender:Robson, Pablo Ledger, CAROLINE Weeks in Treatment: 3 Edema Assessment Assessed: [Left: No] [Right: No] E[Left: dema] [Right: :] Calf Left: Right: Point of Measurement: 41 cm From Medial Instep 30.5 cm cm Ankle Left: Right: Point of Measurement: 10 cm From Medial Instep 22.5 cm cm Vascular Assessment Pulses: Dorsalis Pedis Palpable: [Left:Yes] Electronic Signature(s) Signed: 09/17/2019 5:40:42 PM By: Kela Millin Entered By: Kela Millin on 09/17/2019 15:01:30 -------------------------------------------------------------------------------- Multi Wound Chart Details Patient Name: Date of Service: Matthew Amass T. 09/17/2019 2:45 PM Medical Record [id number] Patient Account Number: [id number] Date of Birth/Sex: Treating RN: August 22, 1950 (69 y.o. [name] Primary Care Maudy Yonan: PATIENT, NO Other Clinician: Referring Neno Hohensee: Treating Bobie Caris/Extender:Robson, Pablo Ledger, CAROLINE Weeks in Treatment: 3 Vital Signs Height(in): 72 Pulse(bpm): 61 Weight(lbs): 130 Blood Pressure(mmHg): 129/44 Body Mass Index(BMI):  18 Temperature(F): 97.6 Respiratory 18 Rate(breaths/min): Photos: [1:No Photos] [2:No Photos] [3:No Photos] Wound Location: [1:Left, Distal, Anterior Lower Left, Lateral Ankle Leg] [3:Left, Distal, Lateral Lower Leg] Wounding Event: [1:Trauma] [2:Blister] [3:Gradually Appeared] Primary Etiology: [1:Arterial Insufficiency Ulcer Arterial Insufficiency Ulcer Arterial Insufficiency Ulcer] Comorbid History: [1:Peripheral Arterial Disease Peripheral Arterial Disease Peripheral Arterial Disease] Date Acquired: [1:07/08/2018] [2:08/06/2019] [3:09/17/2019] Weeks of Treatment: [1:3] [2:3] [3:0] Wound Status: [1:Open] [2:Open] [3:Open] Measurements L x W x D 10.5x4.8x0.3 [2:0.7x1x0.1] [3:0.5x0.5x0.1] (cm) Area (cm) : [1:39.584] [2:0.55] [  3:0.196] Volume (cm) : [1:11.875] [2:0.055] [3:0.02] % Reduction in Area: [1:-92.00%] [2:-100.00%] [3:N/A] % Reduction in Volume: -188.00% [2:-103.70%] [3:N/A] Classification: [1:Full Thickness Without Exposed Support Structures Exposed Support Structures Exposed Support Structures] [2:Full Thickness Without] [3:Full Thickness Without] Exudate Amount: [1:Medium] [2:None Present] [3:Medium] Exudate Type: [1:Serosanguineous] [2:N/A] [3:Serosanguineous] Exudate Color: [1:red, brown] [2:N/A] [3:red, brown] Wound Margin: [1:Well defined, not attached Distinct, outline attached Distinct, outline attached] Granulation Amount: [1:Small (1-33%)] [2:None Present (0%)] [3:Large (67-100%)] Granulation Quality: [1:Pink] [2:N/A] [3:Pink] Necrotic Amount: [1:Large (67-100%)] [2:Large (67-100%)] [3:Small (1-33%)] Necrotic Tissue: [1:Eschar, Adherent [place] Adherent Slough] Exposed Structures: [1:Fat Layer (Subcutaneous Fascia: No Tissue) Exposed: Yes Fascia: No Tendon: No Muscle: No Joint: No Bone: No Small (1-33%)] [2:Fat Layer (Subcutaneous Tissue) Exposed: No Tendon: No Muscle: No Joint: No Bone: No None] [3:Fat Layer (Subcutaneous Tissue)  Exposed: Yes  Fascia: No Tendon: No Muscle: No Joint: No Bone: No None] Treatment Notes Wound #1 (Left, Distal, Anterior Lower Leg) 1. Cleanse With Wound Cleanser Soap and water 2. Periwound Care Moisturizing lotion 3. Primary Dressing Applied Iodoflex 4. Secondary Dressing ABD Pad Dry Gauze 6. Support Layer Applied Kerlix/Coban Notes netting Wound #2 (Left, Lateral Ankle) 1. Cleanse With Wound Cleanser Soap and water 2. Periwound Care Moisturizing lotion 3. Primary Dressing Applied Iodoflex 4. Secondary Dressing ABD Pad Dry Gauze 6. Support Layer Applied Kerlix/Coban Notes netting Wound #3 (Left, Distal, Lateral Lower Leg) 1. Cleanse With Wound Cleanser Soap and water 2. Periwound Care Moisturizing lotion 3. Primary Dressing Applied Iodoflex 4. Secondary Dressing ABD Pad Dry Gauze 6. Support Layer Applied Kerlix/Coban Notes Horticulturist, commercial) Signed: 09/18/2019 5:51:00 PM By: Linton Ham MD Signed: 09/19/2019 5:43:43 PM By: Levan Hurst RN, BSN Entered By: Linton Ham on 09/18/2019 07:24:49 -------------------------------------------------------------------------------- Multi-Disciplinary Care Plan Details Patient Name: Date of Service: [name] 09/17/2019 2:45 PM Medical Record [id number] Patient Account Number: [id number] Date of Birth/Sex: Treating RN: 1951-02-11 (69 y.o. Hessie Diener Primary Care Shaylene Paganelli: PATIENT, NO Other Clinician: Referring Matraca Hunkins: Treating Woods Gangemi/Extender:Robson, Pablo Ledger, CAROLINE Weeks in Treatment: 3 Active Inactive Abuse / Safety / Falls / Self Care Management Nursing Diagnoses: Potential for falls Potential for injury related to falls Goals: Patient will not experience any injury related to falls Date Initiated: 08/27/2019 Target Resolution Date: 09/28/2019 Goal Status: Active Patient/caregiver will verbalize/demonstrate measures taken to prevent injury and/or falls Date  Initiated: 08/27/2019 Target Resolution Date: 09/28/2019 Goal Status: Active Interventions: Assess Activities of Daily Living upon admission and as needed Assess fall risk on admission and as needed Assess: immobility, friction, shearing, incontinence upon admission and as needed Assess impairment of mobility on admission and as needed per policy Assess personal safety and home safety (as indicated) on admission and as needed Provide education on fall prevention Provide education on personal and home safety Notes: Tissue Oxygenation Nursing Diagnoses: Actual ineffective tissue perfusion; peripheral (select once diagnosis is confirmed) Knowledge deficit related to disease process and management Goals: Patient/caregiver will verbalize understanding of disease process and disease management Date Initiated: 08/27/2019 Target Resolution Date: 09/28/2019 Goal Status: Active Interventions: Assess patient understanding of disease process and management upon diagnosis and as needed Assess peripheral arterial status upon admission and as needed Provide education on tissue oxygenation and ischemia Notes: Wound/Skin Impairment Nursing Diagnoses: Impaired tissue integrity Knowledge deficit related to ulceration/compromised skin integrity Goals: Patient/caregiver will verbalize understanding of skin care regimen Date Initiated: 08/27/2019 Date Inactivated: 09/17/2019 Target Resolution Date: 09/28/2019 Goal Status: Met Ulcer/skin breakdown will have a volume reduction of  30% by week 4 Date Initiated: 08/27/2019 Target Resolution Date: 09/28/2019 Goal Status: Active Interventions: Assess patient/caregiver ability to obtain necessary supplies Assess patient/caregiver ability to perform ulcer/skin care regimen upon admission and as needed Assess ulceration(s) every visit Provide education on ulcer and skin care Notes: Electronic Signature(s) Signed: 09/17/2019 5:40:16 PM By: Deon Pilling Entered  By: Deon Pilling on 09/17/2019 15:28:43 -------------------------------------------------------------------------------- Pain Assessment Details Patient Name: Date of Service: HYUN, MARSALIS 09/17/2019 2:45 PM Medical Record [id number] Patient Account Number: [id number] Date of Birth/Sex: Treating RN: 01-Oct-1950 (69 y.o. Marvis Repress Primary Care Joley Utecht: PATIENT, NO Other Clinician: Referring Trudie Cervantes: Treating Giselle Brutus/Extender:Robson, Pablo Ledger, CAROLINE Weeks in Treatment: 3 Active Problems Location of Pain Severity and Description of Pain Patient Has Paino Yes Site Locations Pain Location: Pain in Ulcers With Dressing Change: Yes Duration of the Pain. Constant / Intermittento Constant Rate the pain. Current Pain Level: 6 Worst Pain Level: 10 Least Pain Level: 5 Tolerable Pain Level: 5 Character of Pain Describe the Pain: Burning, Shooting, Stabbing Pain Management and Medication Current Pain Management: Electronic Signature(s) Signed: 09/17/2019 5:40:42 PM By: Kela Millin Entered By: Kela Millin on 09/17/2019 14:58:44 -------------------------------------------------------------------------------- Patient/Caregiver Education Details Patient Name: Date of Service: [name] 4/12/2021andnbsp2:45 PM Medical Record [id number] Patient Account Number: [id number] Date of Birth/Gender: Treating RN: 12-Aug-1950 (69 y.o. Hessie Diener Primary Care Physician: PATIENT, NO Other Clinician: Referring Physician: Treating Physician/Extender:Robson, Pablo Ledger, Titus Dubin in Treatment: 3 Education Assessment Education Provided To: Patient Education Topics Provided Safety: Handouts: Medication Safety Methods: Explain/Verbal Responses: Reinforcements needed Electronic Signature(s) Signed: 09/17/2019 5:40:16 PM By: Deon Pilling Entered By: Deon Pilling on 09/17/2019  15:28:56 -------------------------------------------------------------------------------- Wound Assessment Details Patient Name: Date of Service: JAYEN, BROMWELL 09/17/2019 2:45 PM Medical Record [id number] Patient Account Number: [id number] Date of Birth/Sex: Treating RN: September 30, 1950 (69 y.o. Marvis Repress Primary Care Alessandro Griep: PATIENT, NO Other Clinician: Referring Aianna Fahs: Treating Naijah Lacek/Extender:Robson, Pablo Ledger, CAROLINE Weeks in Treatment: 3 Wound Status Wound Number: 1 Primary Etiology: Arterial Insufficiency Ulcer Wound Location: Left, Distal, Anterior Lower Leg Wound Status: Open Wounding Event: Trauma Comorbid History: Peripheral Arterial Disease Date Acquired: 07/08/2018 Weeks Of Treatment: 3 Clustered Wound: No Wound Measurements Length: (cm) 10.5 % Reduct Width: (cm) 4.8 % Reduct Depth: (cm) 0.3 Epitheli Area: (cm) 39.584 Tunneli Volume: (cm) 11.875 Undermi Wound Description Classification: Full Thickness Without Exposed Support Foul Odo Structures Slough/F Wound Well defined, not attached Margin: Exudate Medium Amount: Exudate Serosanguineous Type: Exudate red, brown Color: Wound Bed Granulation Amount: Small (1-33%) Granulation Quality: Pink Fascia E Necrotic Amount: Large (67-100%) Fat Laye Necrotic Quality: Eschar, Adherent Slough Tendon E Muscle E Joint Ex Bone Exp r After Cleansing: No ibrino Yes Exposed Structure xposed: No r (Subcutaneous Tissue) Exposed: Yes xposed: No xposed: No posed: No osed: No ion in Area: -92% ion in Volume: -188% alization: Small (1-33%) ng: No ning: No Treatment Notes Wound #1 (Left, Distal, Anterior Lower Leg) 1. Cleanse With Wound Cleanser Soap and water 2. Periwound Care Moisturizing lotion 3. Primary Dressing Applied Iodoflex 4. Secondary Dressing ABD Pad Dry Gauze 6. Support Layer Applied Kerlix/Coban Notes Horticulturist, commercial) Signed: 09/17/2019  5:40:42 PM By: Kela Millin Entered By: Kela Millin on 09/17/2019 15:13:42 -------------------------------------------------------------------------------- Wound Assessment Details Patient Name: Date of Service: LEONA, PRESSLY 09/17/2019 2:45 PM Medical Record [id number] Patient Account Number: [id number] Date of Birth/Sex: Treating RN: Aug 21, 1950 (69 y.o. Marvis Repress Primary Care Maegan Buller: PATIENT, NO Other Clinician: Referring Osamah Schmader: Treating Kenlynn Houde/Extender:Robson, Pablo Ledger, CAROLINE Weeks in  Treatment: 3 Wound Status Wound Number: 2 Primary Etiology: Arterial Insufficiency Ulcer Wound Location: Left, Lateral Ankle Wound Status: Open Wounding Event: Blister Comorbid History: Peripheral Arterial Disease Date Acquired: 08/06/2019 Weeks Of Treatment: 3 Clustered Wound: No Wound Measurements Length: (cm) 0.7 % Reduct Width: (cm) 1 % Reduct Depth: (cm) 0.1 Epitheli Area: (cm) 0.55 Tunneli Volume: (cm) 0.055 Undermi Wound Description Classification: Full Thickness Without Exposed Support Foul Odo Structures Slough/F Wound Distinct, outline attached Margin: Exudate None Present Amount: Wound Bed Granulation Amount: None Present (0%) Necrotic Amount: Large (67-100%) Fascia E Necrotic Quality: Eschar, Adherent Slough Fat Laye Tendon E Muscle E Joint Ex Bone Exp Treatment Notes Wound #2 (Left, Lateral Ankle) 1. Cleanse With Wound Cleanser Soap and water 2. Periwound Care Moisturizing lotion 3. Primary Dressing Applied Iodoflex 4. Secondary Dressing ABD Pad Dry Gauze 6. Support Layer Applied Kerlix/Coban Notes Horticulturist, commercial) Signed: 09/17/2019 5:40:42 PM By: Kela Millin Entered By: Kela Millin on 04/ r After Cleansing: No ibrino Yes Exposed Structure xposed: No r (Subcutaneous Tissue) Exposed: No xposed: No xposed: No posed: No osed: No 05/2020 15:15:16 ion in Area: -100% ion in  Volume: -103.7% alization: None ng: No ning: No -------------------------------------------------------------------------------- Wound Assessment Details Patient Name: Date of Service: DALIN, CALDERA 09/17/2019 2:45 PM Medical Record [id number] Patient Account Number: [id number] Date of Birth/Sex: Treating RN: 1950/09/02 (69 y.o. Marvis Repress Primary Care Brenley Priore: PATIENT, NO Other Clinician: Referring Cerina Leary: Treating Kemal Amores/Extender:Robson, Pablo Ledger, CAROLINE Weeks in Treatment: 3 Wound Status Wound Number: 3 Primary Etiology: Arterial Insufficiency Ulcer Wound Location: Left, Distal, Lateral Lower Leg Wound Status: Open Wounding Event: Gradually Appeared Comorbid History: Peripheral Arterial Disease Date Acquired: 09/17/2019 Weeks Of Treatment: 0 Clustered Wound: No Wound Measurements Length: (cm) 0.5 % Reduct Width: (cm) 0.5 % Reduct Depth: (cm) 0.1 Epitheli Area: (cm) 0.196 Tunneli Volume: (cm) 0.02 Undermi Wound Description Classification: Full Thickness Without Exposed Support Foul Odo Structures Slough/F Wound Distinct, outline attached Margin: Exudate Exudate Medium Amount: Exudate Serosanguineous Type: Exudate red, brown Color: Wound Bed Granulation Amount: Large (67-100%) Granulation Quality: Pink Fascia Expo Necrotic Amount: Small (1-33%) Fat Layer ( Necrotic Quality: Adherent Slough Tendon Expo Muscle Expo Joint Expos Bone Expose r After Cleansing: No ibrino Yes Exposed Structure sed: No Subcutaneous Tissue) Exposed: Yes sed: No sed: No ed: No d: No ion in Area: ion in Volume: alization: None ng: No ning: No Treatment Notes Wound #3 (Left, Distal, Lateral Lower Leg) 1. Cleanse With Wound Cleanser Soap and water 2. Periwound Care Moisturizing lotion 3. Primary Dressing Applied Iodoflex 4. Secondary Dressing ABD Pad Dry Gauze 6. Support Layer Applied Kerlix/Coban Notes Careers adviser) Signed: 09/17/2019 5:40:42 PM By: Kela Millin Entered By: Kela Millin on 09/17/2019 15:12:56 -------------------------------------------------------------------------------- Vitals Details Patient Name: Date of Service: [name] 09/17/2019 2:45 PM Medical Record [id number] Patient Account Number: [id number] Date of Birth/Sex: Treating RN: Jan 18, 1951 (69 y.o. Marvis Repress Primary Care Cedar Ditullio: PATIENT, NO Other Clinician: Referring Vaiden Adames: Treating Everline Mahaffy/Extender:Robson, Pablo Ledger, CAROLINE Weeks in Treatment: 3 Vital Signs Time Taken: 14:55 Temperature (F): 97.6 Height (in): 72 Pulse (bpm): 61 Weight (lbs): 130 Respiratory Rate (breaths/min): 18 Body Mass Index (BMI): 17.6 Blood Pressure (mmHg): 129/44 Reference Range: 80 - 120 mg / dl Electronic Signature(s) Signed: 09/17/2019 5:40:42 PM By: Kela Millin Entered By: Kela Millin on 09/17/2019 14:58:18

## 2019-09-18 NOTE — Progress Notes (Signed)
Matthew [name], Trew T. ([id number]) Visit Report for 09/17/2019 HPI Details Patient Name: Date of Service: Matthew [name], Matthew T. 09/17/2019 2:45 PM Medical Record [id number] Patient Account Number: [id number] Date of Birth/Sex: Treating RN: 11-26-1950 (69 y.o. Matthew [name]) [name], Matthew Primary Care Provider: PATIENT, NO Other Clinician: Referring Provider: Treating Provider/Extender:Matthew [name], Matthew [name] [name], Matthew [name] Weeks in Treatment: 3 History of Present Illness HPI Description: ADMISSION 08/27/2019 This is a 69 year old man who tells me that he fell going up stairs about a year ago resulting in a deep laceration type wound on the left anterior mid tibia. He was soon found to have significant PAD/critical limb ischemia. He had had previous noninvasive studies in 2017 showing an ABI of 0.82 on the right and 0.91 on the left with monophasic to biphasic waveforms. On 07/18/2019 his ABI in the right was 0.58 on the left 0.52 with TBI's of 0.51 on the right and 0 on the left. He underwent an angiogram by Dr. Randie Heinzain on 3/1. He underwent bilateral iliac artery stents he was also discovered to have a left-sided SFA with it was patent up to 10 centimeters proximal to the adductor canal but then became stenotic with a 95% stenosis for 15 cm. Dominant runoff is via the posterior tibial artery. The patient still has the wound on the left anterior mid tibia. He has been using Vaseline to this. He wraps this and gauze and some form of Ace wrap. He complains bitterly of the pain also complains about falling with the leg giving out on him. About 2 weeks ago he developed a new wound on the left lateral ankle. He had a DVT rule out study on 3/16/ 1 that was negative for a DVT Past medical history includes cervical spondylosis, left carotid bruit, marked PAD. ABI in our clinic was 0.71 on the left 3/29; patient admitted to the clinic last week with a laceration type injury on the left anterior mid tibia in the  setting of severe PAD. He was supposed to follow-up with Dr. Randie Heinzain last week but he tells me he had to cancel the appointment because of nausea and vomiting. I have encouraged him to rebook this ASAP. He tolerated our kerlix Coban wrap without incidence that the pain was better. We used Iodoflex to the wound area 4/5; working on getting him back in with Dr. Randie Heinzain. We have been using Iodoflex under Curlex and Coban. He seems to be tolerating this 4/12; still complaining of a lot of pain. Wound itself on the left anterior mid tibia looks about the same perhaps somewhat better looking tissue. he has a new area on the left medial lower leg and ankle. He has previously had bilateral external iliac artery stents. Still complaining of a lot of pain Electronic Signature(s) Signed: 09/18/2019 5:51:00 PM By: Matthew [name], Matthew Cragun MD Entered By: Matthew [name], Matthew [name] on 09/18/2019 07:27:04 -------------------------------------------------------------------------------- Physical Exam Details Patient Name: Date of Service: Matthew [name], Matthew T. 09/17/2019 2:45 PM Medical Record [id number] Patient Account Number: [id number] Date of Birth/Sex: Treating RN: 11-26-1950 [age] y.o. Matthew [name]) [name], Matthew Primary Care Provider: PATIENT, NO Other Clinician: Referring Provider: Treating Provider/Extender:Matthew [name], Matthew [name] [name], Matthew [name] Weeks in Treatment: 3 Constitutional Sitting or standing Blood Pressure is within target range for patient.. Pulse regular and within target range for patient.Marland Kitchen. Respirations regular, non-labored and within target range.. Temperature is normal and within the target range for the patient.Marland Kitchen. Appears in no distress. Cardiovascular Pedal pulses are nonpalpable on the left. Foot and lower leg looks ischemic pedal pulses are nonpalpable. Notes Wound exam; long  laceration wound in the anterior mid tibia. He has less necrotic tissue however the wound bed still looks somewhat pale. No mechanical  debridement. He tolerates nonmechanical debridement with wound cleanser and gauze poorly. She has new areas on the medial lower leg/ankle. These are in a similar state washed off with Anasept and gauze. Electronic Signature(s) Signed: 09/18/2019 5:51:00 PM By: Linton Ham MD Entered By: Linton Ham on 09/18/2019 07:29:14 -------------------------------------------------------------------------------- Physician Orders Details Patient Name: Date of Service: Matthew [name] 09/17/2019 2:45 PM Medical Record [id number] Patient Account Number: [id number] Date of Birth/Sex: Treating RN: Feb 16, 1951 (69 y.o. 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 Dressing Change Frequency Do not change entire dressing for one week. - all wounds Skin Barriers/Peri-Wound Care Barrier cream Moisturizing lotion TCA Cream or Ointment - mix with lotion Wound Cleansing May shower with protection. - use cast protector Primary Wound Dressing Wound #1 Left,Distal,Anterior Lower Leg Iodoflex Wound #2 Left,Lateral Ankle Iodoflex Wound #3 Left,Distal,Lateral Lower Leg Iodoflex Secondary Dressing Wound #1 Left,Distal,Anterior Lower Leg Dry Gauze ABD pad Wound #2 Left,Lateral Ankle Dry Gauze ABD pad Wound #3 Left,Distal,Lateral Lower Leg Dry Gauze ABD pad Edema Control Kerlix and Coban - Left Lower Extremity  - ****LIGHTLY WRAP**** Avoid standing for long periods of time Elevate legs to the level of the heart or above for 30 minutes daily and/or when sitting, a frequency of: - throughout the day Additional Orders / Instructions Other: - Be sure to reschedule appointment with Dr. Donzetta Matters ASAP Electronic Signature(s) Signed: 09/17/2019 6:12:10 PM By: Levan Hurst RN, BSN Signed: 09/18/2019 5:51:00 PM By: Linton Ham MD Entered By: Levan Hurst on 09/17/2019 16:00:01 -------------------------------------------------------------------------------- Problem List Details Patient Name: Date of Service: [name] 09/17/2019 2:45 PM Medical Record [id number] Patient Account Number: [id number] Date of Birth/Sex: Treating RN: 1950/10/12 (69 y.o. Hessie Diener Primary Care Provider: PATIENT, NO Other Clinician: Referring Provider: Treating Provider/Extender:Jasmain Ahlberg, Matthew [name], Matthew [name] Weeks in Treatment: 3 Active Problems ICD-10 Evaluated Encounter Code Description Active Date Today Diagnosis I70.242 Atherosclerosis of native arteries of left leg with 08/27/2019 No Yes ulceration of calf I70.243 Atherosclerosis of native arteries of left leg with 08/27/2019 No Yes ulceration of ankle L97.828 Non-pressure chronic ulcer of other part of left lower 08/27/2019 No Yes leg with other specified severity L97.322 Non-pressure chronic ulcer of left ankle with fat layer 08/27/2019 No Yes exposed Inactive Problems Resolved Problems Electronic Signature(s) Signed: 09/18/2019 5:51:00 PM By: Linton Ham MD Previous Signature: 09/17/2019 6:12:10 PM Version By: Levan Hurst RN, BSN Entered By: Linton Ham on 09/18/2019 07:23:45 -------------------------------------------------------------------------------- Progress Note Details Patient Name: Date of Service: Matthew [name] 09/17/2019 2:45 PM Medical Record [id number] Patient Account Number: [id number] Date of  Birth/Sex: Treating RN: 03-Oct-1950 (69 y.o. Matthew [name] Primary Care Provider: PATIENT, NO Other Clinician: Referring Provider: Treating Provider/Extender:Jinan Biggins, Matthew [name], Matthew [name] Weeks in Treatment: 3 Subjective History of Present Illness (HPI) ADMISSION 08/27/2019 This is a 70 year old man who tells me that he fell going up stairs about a year ago resulting in a  deep laceration type wound on the left anterior mid tibia. He was soon found to have significant PAD/critical limb ischemia. He had had previous noninvasive studies in 2017 showing an ABI of 0.82 on the right and 0.91 on the left with monophasic to biphasic waveforms. On 07/18/2019 his ABI in the right was 0.58 on the left 0.52 with TBI's of 0.51 on the right and 0 on the left. He underwent an angiogram by Dr. Randie Heinz on 3/1. He underwent bilateral iliac artery stents he was also discovered to have a left-sided SFA with it was patent up to 10 centimeters proximal to the adductor canal but then became stenotic with a 95% stenosis for 15 cm. Dominant runoff is via the posterior tibial artery. The patient still has the wound on the left anterior mid tibia. He has been using Vaseline to this. He wraps this and gauze and some form of Ace wrap. He complains bitterly of the pain also complains about falling with the leg giving out on him. About 2 weeks ago he developed a new wound on the left lateral ankle. He had a DVT rule out study on 3/16/ 1 that was negative for a DVT Past medical history includes cervical spondylosis, left carotid bruit, marked PAD. ABI in our clinic was 0.71 on the left 3/29; patient admitted to the clinic last week with a laceration type injury on the left anterior mid tibia in the setting of severe PAD. He was supposed to follow-up with Dr. Randie Heinz last week but he tells me he had to cancel the appointment because of nausea and vomiting. I have encouraged him to rebook this ASAP. He tolerated our  kerlix Coban wrap without incidence that the pain was better. We used Iodoflex to the wound area 4/5; working on getting him back in with Dr. Randie Heinz. We have been using Iodoflex under Curlex and Coban. He seems to be tolerating this 4/12; still complaining of a lot of pain. Wound itself on the left anterior mid tibia looks about the same perhaps somewhat better looking tissue. he has a new area on the left medial lower leg and ankle. He has previously had bilateral external iliac artery stents. Still complaining of a lot of pain Objective Constitutional Sitting or standing Blood Pressure is within target range for patient.. Pulse regular and within target range for patient.Marland Kitchen Respirations regular, non-labored and within target range.. Temperature is normal and within the target range for the patient.Marland Kitchen Appears in no distress. Vitals Time Taken: 2:55 PM, Height: 72 in, Weight: 130 lbs, BMI: 17.6, Temperature: 97.6 F, Pulse: 61 bpm, Respiratory Rate: 18 breaths/min, Blood Pressure: 129/44 mmHg. Cardiovascular Pedal pulses are nonpalpable on the left. Foot and lower leg looks ischemic pedal pulses are nonpalpable. General Notes: Wound exam; long laceration wound in the anterior mid tibia. He has less necrotic tissue however the wound bed still looks somewhat pale. No mechanical debridement. He tolerates nonmechanical debridement with wound cleanser and gauze poorly. ooShe has new areas on the medial lower leg/ankle. These are in a similar state washed off with Anasept and gauze. Integumentary (Hair, Skin) Wound #1 status is Open. Original cause of wound was Trauma. The wound is located on the [facility] Lower Leg. The wound measures 10.5cm length x 4.8cm width x 0.3cm depth; 39.584cm^2 area and 11.875cm^3 volume. There is Fat Layer (Subcutaneous Tissue) Exposed exposed. There is no tunneling or undermining noted. There is a medium amount of serosanguineous drainage noted. The wound  margin is well defined and  not attached to the wound base. There is small (1-33%) pink granulation within the wound bed. There is a large (67-100%) amount of necrotic tissue within the wound bed including Eschar and Adherent Slough. Wound #2 status is Open. Original cause of wound was Blister. The wound is located on the Left,Lateral Ankle. The wound measures 0.7cm length x 1cm width x 0.1cm depth; 0.55cm^2 area and 0.055cm^3 volume. There is no tunneling or undermining noted. There is a none present amount of drainage noted. The wound margin is distinct with the outline attached to the wound base. There is no granulation within the wound bed. There is a large (67- 100%) amount of necrotic tissue within the wound bed including Eschar and Adherent Slough. Wound #3 status is Open. Original cause of wound was Gradually Appeared. The wound is located on the Left,Distal,Lateral Lower Leg. The wound measures 0.5cm length x 0.5cm width x 0.1cm depth; 0.196cm^2 area and 0.02cm^3 volume. There is Fat Layer (Subcutaneous Tissue) Exposed exposed. There is no tunneling or undermining noted. There is a medium amount of serosanguineous drainage noted. The wound margin is distinct with the outline attached to the wound base. There is large (67-100%) pink granulation within the wound bed. There is a small (1-33%) amount of necrotic tissue within the wound bed including Adherent Slough. 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 Dressing Change Frequency: Do not change entire dressing for one week. - all wounds Skin Barriers/Peri-Wound Care: Barrier  cream Moisturizing lotion TCA Cream or Ointment - mix with lotion Wound Cleansing: May shower with protection. - use cast protector Primary Wound Dressing: Wound #1 Left,Distal,Anterior Lower Leg: Iodoflex Wound #2 Left,Lateral Ankle: Iodoflex Wound #3 Left,Distal,Lateral Lower Leg: Iodoflex Secondary Dressing: Wound #1 Left,Distal,Anterior Lower Leg: Dry Gauze ABD pad Wound #2 Left,Lateral Ankle: Dry Gauze ABD pad Wound #3 Left,Distal,Lateral Lower Leg: Dry Gauze ABD pad Edema Control: Kerlix and Coban - Left Lower Extremity - ****LIGHTLY WRAP**** Avoid standing for long periods of time Elevate legs to the level of the heart or above for 30 minutes daily and/or when sitting, a frequency of: - throughout the day Additional Orders / Instructions: Other: - Be sure to reschedule appointment with Dr. Randie Heinz ASAP 1. Continue Iodoflex for another week to both wound areas. In terms of debris on the wound surface this seems better although I continue to think that there is insufficient blood flow to both of these areas accounting for the appearance of the wound and his complaints of pain 2. He has severe PAD. He has had bilateral iliac artery stents and had a left SFA occlusion. I will need to check notes about anything that was done to this. In any case I will reach out to Dr. Randie Heinz about follow-up. The patient apparently missed an appointment. I am hopeful that he is still a candidate for further revascularization 3. We should be able to change to another dressing next week perhaps Hydrofera Blue or collagen. Nevertheless I continue to think that he is going to require more blood flow for healing. If we can get more blood flow to this area I wonder about an advanced treatment product Electronic Signature(s) Signed: 09/18/2019 5:51:00 PM By: Matthew Najjar MD Entered  By: Matthew Najjar on 09/18/2019  07:40:28 -------------------------------------------------------------------------------- SuperBill Details Patient Name: Date of Service: AREG, BIALAS 09/17/2019 Medical Record [id number] Patient Account Number: [id number] Date of Birth/Sex: Treating RN: 27-May-1951 (69 y.o. 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

## 2019-09-19 ENCOUNTER — Other Ambulatory Visit: Payer: Self-pay | Admitting: *Deleted

## 2019-09-19 DIAGNOSIS — I739 Peripheral vascular disease, unspecified: Secondary | ICD-10-CM

## 2019-09-19 NOTE — Progress Notes (Signed)
DEWANE, TIMSON ([id number]) Visit Report for 09/03/2019 Arrival Information Details Patient Name: Date of Service: Matthew [name], Matthew [name] 09/03/2019 1:45 PM Medical Record [id number] Patient Account Number: [id number] Date of Birth/Sex: Treating RN: [birth year] (69 y.o. [name] Primary Care Noah Lembke: PATIENT, NO Other Clinician: Referring Alphia Behanna: Treating Alexya Mcdaris/Extender:Robson, Pablo Ledger, CAROLINE Weeks in Treatment: 1 Visit Information History Since Last Visit Cane Added or deleted any medications: No Patient Arrived: 13:47 Any new allergies or adverse reactions: No Arrival Time: Had a fall or experienced change in No Accompanied By: self None activities of daily living that may affect Transfer Assistance: risk of falls: Patient Identification Verified: Yes Signs or symptoms of abuse/neglect since last No Secondary Verification Process Completed: Yes visito Patient Requires Transmission-Based No Hospitalized since last visit: No Precautions: Implantable device outside of the clinic excluding No Patient Has Alerts: No cellular tissue based products placed in the center since last visit: Has Dressing in Place as Prescribed: Yes Pain Present Now: No Electronic Signature(s) Signed: 09/19/2019 9:21:08 AM By: Sandre Kitty Entered By: Sandre Kitty on 09/03/2019 13:47:25 -------------------------------------------------------------------------------- Clinic Level of Care Assessment Details Patient Name: Date of Service: Matthew [name], Matthew [name] 09/03/2019 1:45 PM Medical Record [id number] Patient Account Number: [id number] Date of Birth/Sex: Treating RN: 29-Aug-1950 (69 y.o. Ernestene Mention Primary Care Gizella [place]: PATIENT, NO Other Clinician: Referring Janiesha Diehl: Treating Rhandi Despain/Extender:Robson, Pablo Ledger, CAROLINE Weeks in Treatment: 1 Clinic Level of Care Assessment Items TOOL 4 Quantity Score []  - Use when only an EandM is  performed on FOLLOW-UP visit 0 ASSESSMENTS - Nursing Assessment / Reassessment X - Reassessment of Co-morbidities (includes updates in patient status) 1 10 X - Reassessment of Adherence to Treatment Plan 1 5 ASSESSMENTS - Wound and Skin Assessment / Reassessment []  - Simple Wound Assessment / Reassessment - one wound 0 X - Complex Wound Assessment / Reassessment - multiple wounds 2 5 []  - Dermatologic / Skin Assessment (not related to wound area) 0 ASSESSMENTS - Focused Assessment X - Circumferential Edema Measurements - multi extremities 1 5 []  - Nutritional Assessment / Counseling / Intervention 0 X - Lower Extremity Assessment (monofilament, tuning fork, pulses) 1 5 []  - Peripheral Arterial Disease Assessment (using hand held doppler) 0 ASSESSMENTS - Ostomy and/or Continence Assessment and Care []  - Incontinence Assessment and Management 0 []  - Ostomy Care Assessment and Management (repouching, etc.) 0 PROCESS - Coordination of Care X - Simple Patient / Family Education for ongoing care 1 15 []  - Complex (extensive) Patient / Family Education for ongoing care 0 X - Staff obtains Programmer, systems, Records, Test Results / Process Orders 1 10 []  - Staff telephones HHA, Nursing Homes / Clarify orders / etc 0 []  - Routine Transfer to another Facility (non-emergent condition) 0 []  - Routine Hospital Admission (non-emergent condition) 0 []  - New Admissions / Biomedical engineer / Ordering NPWT, Apligraf, etc. 0 []  - Emergency Hospital Admission (emergent condition) 0 X - Simple Discharge Coordination 1 10 []  - Complex (extensive) Discharge Coordination 0 PROCESS - Special Needs []  - Pediatric / Minor Patient Management 0 []  - Isolation Patient Management 0 []  - Hearing / Language / Visual special needs 0 []  - Assessment of Community assistance (transportation, D/C planning, etc.) 0 []  - Additional assistance / Altered mentation 0 []  - Support Surface(s) Assessment (bed, cushion, seat, etc.)  0 INTERVENTIONS - Wound Cleansing / Measurement []  - Simple Wound Cleansing - one wound 0 X - Complex Wound Cleansing - multiple wounds 2 5 X - Wound Imaging (photographs -  any number of wounds) 1 5 []  - Wound Tracing (instead of photographs) 0 []  - Simple Wound Measurement - one wound 0 X - Complex Wound Measurement - multiple wounds 2 5 INTERVENTIONS - Wound Dressings []  - Small Wound Dressing one or multiple wounds 0 X - Medium Wound Dressing one or multiple wounds 1 15 []  - Large Wound Dressing one or multiple wounds 0 X - Application of Medications - topical 1 5 []  - Application of Medications - injection 0 INTERVENTIONS - Miscellaneous []  - External ear exam 0 []  - Specimen Collection (cultures, biopsies, blood, body fluids, etc.) 0 []  - Specimen(s) / Culture(s) sent or taken to Lab for analysis 0 []  - Patient Transfer (multiple staff / / Similar devices) 0 []  - Simple Staple / Suture removal (25 or less) 0 []  - Complex Staple / Suture removal (26 or more) 0 []  - Hypo / Hyperglycemic Management (close monitor of Blood Glucose) 0 []  - Ankle / Brachial Index (ABI) - do not check if billed separately 0 X - Vital Signs 1 5 Has the patient been seen at the hospital within the last three years: Yes Total Score: 120 Level Of Care: New/Established - Level 4 Electronic Signature(s) Signed: 09/03/2019 5:31:53 PM By: RN, BSN Entered By: on 09/03/2019 14:37:05 -------------------------------------------------------------------------------- Encounter Discharge Information Details Patient Name: Date of Service: T. 09/03/2019 1:45 PM Medical Record Patient Account Number: Date of Birth/Sex: Treating RN: [birth year] (69 y.o. Primary Care Cederic Mozley: PATIENT, NO Other Clinician: Referring Larry Alcock: Treating Chestina Komatsu/Extender:Robson, , CAROLINE Weeks in Treatment:  1 Encounter Discharge Information Items Discharge Condition: Stable Ambulatory Status: Cane Discharge Destination: Home Transportation: Other Accompanied By: self Schedule Follow-up Appointment: Yes Clinical Summary of Care: Electronic Signature(s) Signed: 09/03/2019 4:48:26 PM By: 09/05/2019 Entered By: Zenaida Deed on 09/03/2019 15:12:44 -------------------------------------------------------------------------------- Lower Extremity Assessment Details Patient Name: Date of Service: Matthew [name], Matthew [name] 09/03/2019 1:45 PM Medical Record 09/05/2019 Patient Account Number: [id number] Date of Birth/Sex: Treating RN: May 30, 1951 (68 y.o. (75) [name] Primary Care Magdiel Bartles: PATIENT, NO Other Clinician: Referring Brandace Cargle: Treating Florette Thai/Extender:Robson, Lorenso Quarry, CAROLINE Weeks in Treatment: 1 Edema Assessment Assessed: [Left: No] [Right: No] E[Left: dema] [Right: :] Calf Left: Right: Point of Measurement: 41 cm From Medial Instep 36 cm cm Ankle Left: Right: Point of Measurement: 10 cm From Medial Instep 28 cm cm Electronic Signature(s) Signed: 09/03/2019 5:30:47 PM By: Shawn Stall RN Entered By: Shawn Stall on 09/03/2019 14:04:34 -------------------------------------------------------------------------------- Multi-Disciplinary Care Plan Details Patient Name: Date of Service: Matthew Shutter T. 09/03/2019 1:45 PM Medical Record [id number] Patient Account Number: [id number] Date of Birth/Sex: Treating RN: 05-30-1951 (69 y.o. Judie Petit Primary Care Mulan Adan: PATIENT, NO Other Clinician: Referring Delayna Sparlin: Treating Kingsten Enfield/Extender:Robson, [name], CAROLINE Weeks in Treatment: 1 Active Inactive Abuse / Safety / Falls / Self Care Management Nursing Diagnoses: Potential for falls Potential for injury related to falls Goals: Patient will not experience any injury related to falls Date Initiated: 08/27/2019 Target Resolution Date:  09/28/2019 Goal Status: Active Patient/caregiver will verbalize/demonstrate measures taken to prevent injury and/or falls Date Initiated: 08/27/2019 Target Resolution Date: 09/28/2019 Goal Status: Active Interventions: Assess Activities of Daily Living upon admission and as needed Assess fall risk on admission and as needed Assess: immobility, friction, shearing, incontinence upon admission and as needed Assess impairment of mobility on admission and as needed per policy Assess personal safety and home safety (as indicated) on admission and as needed Provide  education on fall prevention Provide education on personal and home safety Notes: Tissue Oxygenation Nursing Diagnoses: Actual ineffective tissue perfusion; peripheral (select once diagnosis is confirmed) Knowledge deficit related to disease process and management Goals: Patient/caregiver will verbalize understanding of disease process and disease management Date Initiated: 08/27/2019 Target Resolution Date: 09/28/2019 Goal Status: Active Interventions: Assess patient understanding of disease process and management upon diagnosis and as needed Assess peripheral arterial status upon admission and as needed Provide education on tissue oxygenation and ischemia Notes: Wound/Skin Impairment Nursing Diagnoses: Impaired tissue integrity Knowledge deficit related to ulceration/compromised skin integrity Goals: Patient/caregiver will verbalize understanding of skin care regimen Date Initiated: 08/27/2019 Target Resolution Date: 09/28/2019 Goal Status: Active Ulcer/skin breakdown will have a volume reduction of 30% by week 4 Date Initiated: 08/27/2019 Target Resolution Date: 09/28/2019 Goal Status: Active Interventions: Assess patient/caregiver ability to obtain necessary supplies Assess patient/caregiver ability to perform ulcer/skin care regimen upon admission and as needed Assess ulceration(s) every visit Provide education on ulcer  and skin care Notes: Electronic Signature(s) Signed: 09/03/2019 5:31:53 PM By: Zenaida Deed RN, BSN Entered By: Zenaida Deed on 09/03/2019 14:28:38 -------------------------------------------------------------------------------- Pain Assessment Details Patient Name: Date of Service: Matthew Shutter 09/03/2019 1:45 PM Medical Record [id number] Patient Account Number: [id number] Date of Birth/Sex: Treating RN: July 22, 1950 (69 y.o. [name] Primary Care Torry Istre: PATIENT, NO Other Clinician: Referring Donte Kary: Treating Brinsley Wence/Extender:Robson, Lorenso Quarry, CAROLINE Weeks in Treatment: 1 Active Problems Location of Pain Severity and Description of Pain Patient Has Paino No Site Locations Pain Management and Medication Current Pain Management: Electronic Signature(s) Signed: 09/07/2019 5:28:43 PM By: Zandra Abts RN, BSN Signed: 09/19/2019 9:21:08 AM By: Karl Ito Entered By: Karl Ito on 09/03/2019 13:47:51 -------------------------------------------------------------------------------- Patient/Caregiver Education Details Patient Name: Date of Service: Matthew [name], Matthew [name] 3/29/2021andnbsp1:45 PM Medical Record [id number] Patient Account Number: [id number] Date of Birth/Gender: Treating RN: August 12, 1950 (69 y.o. Damaris Schooner Primary Care Physician: PATIENT, NO Other Clinician: Referring Physician: Treating Physician/Extender:Robson, Lorenso Quarry, Lujean Amel in Treatment: 1 Education Assessment Education Provided To: Patient Education Topics Provided Tissue Oxygenation: Methods: Explain/Verbal Responses: Reinforcements needed, State content correctly Wound/Skin Impairment: Methods: Explain/Verbal Responses: Reinforcements needed, State content correctly Electronic Signature(s) Signed: 09/03/2019 5:31:53 PM By: Zenaida Deed RN, BSN Entered By: Zenaida Deed on 09/03/2019  14:29:07 -------------------------------------------------------------------------------- Wound Assessment Details Patient Name: Date of Service: Matthew Monte T. 09/03/2019 1:45 PM Medical Record [id number] Patient Account Number: [id number] Date of Birth/Sex: Treating RN: Oct 16, 1950 (69 y.o. [name] Primary Care Branton Einstein: PATIENT, NO Other Clinician: Referring [place] Throckmorton: Treating Edie Darley/Extender:Robson, Lorenso Quarry, CAROLINE Weeks in Treatment: 1 Wound Status Wound Number: 1 Primary Etiology: Arterial Insufficiency Ulcer Wound Location: Left Lower Leg - Anterior, Distal Wound Status: Open Wounding Event: Trauma Comorbid History: Peripheral Arterial Disease Date Acquired: 07/08/2018 Weeks Of Treatment: 1 Clustered Wound: No Photos Photo Uploaded By: Benjaman Kindler on 09/07/2019 10:57:53 Wound Measurements Length: (cm) 10.5 % Reduct Width: (cm) 3 % Reduct Depth: (cm) 0.2 Epitheli Area: (cm) 24.74 Tunneli Volume: (cm) 4.948 Undermi Wound Description Full Thickness Without Exposed Support Foul Od Classification: Structures Slough/ Wound Flat and Intact Margin: Exudate Medium Amount: Exudate Serosanguineous Type: Exudate red, brown Color: Wound Bed Granulation Amount: Small (1-33%) Granulation Quality: Pink Fascia E Necrotic Amount: Large (67-100%) Fat Laye Necrotic Quality: Adherent Slough Tendon E Muscle E Joint Ex Bone Exp or After Cleansing: No Fibrino Yes Exposed Structure xposed: No r (Subcutaneous Tissue) Exposed: Yes xposed: No xposed: No posed: No osed: No ion in Area: -20% ion in Volume: -20%  alization: Small (1-33%) ng: No ning: No Electronic Signature(s) Signed: 09/03/2019 5:30:47 PM By: [name] RN Signed: 09/07/2019 5:28:43 PM By: Zandra Abts RN, BSN Entered By: [name] on 09/03/2019 14:04:45 -------------------------------------------------------------------------------- Wound Assessment  Details Patient Name: Date of Service: Matthew Shutter 09/03/2019 1:45 PM Medical Record [id number] Patient Account Number: [id number] Date of Birth/Sex: Treating RN: Jan 22, 1951 (69 y.o. [name] Primary Care Tosha Belgarde: PATIENT, NO Other Clinician: Referring Mia Milan: Treating Sarahelizabeth Conway/Extender:Robson, Lorenso Quarry, CAROLINE Weeks in Treatment: 1 Wound Status Wound Number: 2 Primary Etiology: Arterial Insufficiency Ulcer Wound Location: Left Ankle - Lateral Wound Status: Open Wounding Event: Blister Comorbid History: Peripheral Arterial Disease Date Acquired: 08/06/2019 Weeks Of Treatment: 1 Clustered Wound: No Photos Photo Uploaded By: Benjaman Kindler on 09/07/2019 10:57:54 Wound Measurements Length: (cm) 0.6 % Reduct Width: (cm) 0.9 % Reduct Depth: (cm) 0.1 Epitheli Area: (cm) 0.424 Tunneli Volume: (cm) 0.042 Undermi Wound Description Full Thickness Without Exposed Support Foul Od Classification: Structures Slough/ Exudate Medium Amount: Exudate Serosanguineous Type: Exudate red, brown Color: Wound Bed Granulation Amount: None Present (0%) Necrotic Amount: Large (67-100%) Fascia Expo Necrotic Quality: Adherent Slough Fat Layer ( Tendon Expo Muscle Expo Joint Expos Bone Expose or After Cleansing: No Fibrino Yes Exposed Structure sed: No Subcutaneous Tissue) Exposed: No sed: No sed: No ed: No d: No ion in Area: -54.2% ion in Volume: -55.6% alization: None ng: No ning: No Electronic Signature(s) Signed: 09/03/2019 5:30:47 PM By: [name] RN Signed: 09/07/2019 5:28:43 PM By: Zandra Abts RN, BSN Entered By: [name] on 09/03/2019 14:04:55 -------------------------------------------------------------------------------- Vitals Details Patient Name: Date of Service: Matthew Shutter 09/03/2019 1:45 PM Medical Record [id number] Patient Account Number: [id number] Date of Birth/Sex: Treating RN: Aug 17, 1950 (69 y.o. [name] Primary Care Masyn Fullam: PATIENT, NO Other Clinician: Referring Johnye Kist: Treating Anglia Blakley/Extender:Robson, Lorenso Quarry, CAROLINE Weeks in Treatment: 1 Vital Signs Time Taken: 13:47 Temperature (F): 98.0 Height (in): 72 Pulse (bpm): 77 Weight (lbs): 130 Respiratory Rate (breaths/min): 18 Body Mass Index (BMI): 17.6 Blood Pressure (mmHg): 154/63 Reference Range: 80 - 120 mg / dl Electronic Signature(s) Signed: 09/19/2019 9:21:08 AM By: Karl Ito Entered By: Karl Ito on 09/03/2019 13:47:44

## 2019-09-19 NOTE — Progress Notes (Signed)
Matthew [name], Matthew T. ([id number]) Visit Report for 09/10/2019 Arrival Information Details Patient Name: Date of Service: Matthew [name], Matthew T. 09/10/2019 2:45 PM Medical Record [id number] Patient Account Number: [id number] Date of Birth/Sex: Treating RN: [birth year] (69 y.o. [name]) Lynch, Shatara Primary Care Amante Fomby: PATIENT, NO Other Clinician: Referring Hassaan Crite: Treating Teniola Tseng/Extender:Robson, Lorenso QuarryMichael ABERMAN, CAROLINE Weeks in Treatment: 2 Visit Information History Since Last Visit Cane Added or deleted any medications: No Patient Arrived: 14:22 Any new allergies or adverse reactions: No Arrival Time: Had a fall or experienced change in No Accompanied By: self None activities of daily living that may affect Transfer Assistance: risk of falls: Patient Identification Verified: Yes Signs or symptoms of abuse/neglect since last No Secondary Verification Process Completed: Yes visito Patient Requires Transmission-Based No Hospitalized since last visit: No Precautions: Implantable device outside of the clinic excluding No Patient Has Alerts: No cellular tissue based products placed in the center since last visit: Has Dressing in Place as Prescribed: Yes Pain Present Now: Yes Electronic Signature(s) Signed: 09/19/2019 9:20:10 AM By: Karl Itoawkins, Destiny Entered By: Karl Itoawkins, Destiny on 09/10/2019 14:23:04 -------------------------------------------------------------------------------- Clinic Level of Care Assessment Details Patient Name: Date of Service: Matthew [name], Matthew T. 09/10/2019 2:45 PM Medical Record [id number] Patient Account Number: [id number] Date of Birth/Sex: Treating RN: [birth year] (69 y.o. [name]) Lynch, Shatara Primary Care Solmon Bohr: PATIENT, NO Other Clinician: Referring Bryon Parker: Treating Ashly Goethe/Extender:Robson, Lorenso QuarryMichael ABERMAN, CAROLINE Weeks in Treatment: 2 Clinic Level of Care Assessment Items TOOL 4 Quantity Score X - Use when only an EandM is performed on  FOLLOW-UP visit 1 0 ASSESSMENTS - Nursing Assessment / Reassessment X - Reassessment of Co-morbidities (includes updates in patient status) 1 10 X - Reassessment of Adherence to Treatment Plan 1 5 ASSESSMENTS - Wound and Skin Assessment / Reassessment []  - Simple Wound Assessment / Reassessment - one wound 0 X - Complex Wound Assessment / Reassessment - multiple wounds 2 5 []  - Dermatologic / Skin Assessment (not related to wound area) 0 ASSESSMENTS - Focused Assessment []  - Circumferential Edema Measurements - multi extremities 0 []  - Nutritional Assessment / Counseling / Intervention 0 X - Lower Extremity Assessment (monofilament, tuning fork, pulses) 1 5 []  - Peripheral Arterial Disease Assessment (using hand held doppler) 0 ASSESSMENTS - Ostomy and/or Continence Assessment and Care []  - Incontinence Assessment and Management 0 []  - Ostomy Care Assessment and Management (repouching, etc.) 0 PROCESS - Coordination of Care X - Simple Patient / Family Education for ongoing care 1 15 []  - Complex (extensive) Patient / Family Education for ongoing care 0 X - Staff obtains ChiropractorConsents, Records, Test Results / Process Orders 1 10 []  - Staff telephones HHA, Nursing Homes / Clarify orders / etc 0 []  - Routine Transfer to another Facility (non-emergent condition) 0 []  - Routine Hospital Admission (non-emergent condition) 0 []  - New Admissions / Manufacturing engineernsurance Authorizations / Ordering NPWT, Apligraf, etc. 0 []  - Emergency Hospital Admission (emergent condition) 0 X - Simple Discharge Coordination 1 10 []  - Complex (extensive) Discharge Coordination 0 PROCESS - Special Needs []  - Pediatric / Minor Patient Management 0 []  - Isolation Patient Management 0 []  - Hearing / Language / Visual special needs 0 []  - Assessment of Community assistance (transportation, D/C planning, etc.) 0 []  - Additional assistance / Altered mentation 0 []  - Support Surface(s) Assessment (bed, cushion, seat, etc.)  0 INTERVENTIONS - Wound Cleansing / Measurement []  - Simple Wound Cleansing - one wound 0 X - Complex Wound Cleansing - multiple wounds 2 5 X - Wound Imaging (photographs -  any number of wounds) 1 5 []  - Wound Tracing (instead of photographs) 0 []  - Simple Wound Measurement - one wound 0 X - Complex Wound Measurement - multiple wounds 2 5 INTERVENTIONS - Wound Dressings []  - Small Wound Dressing one or multiple wounds 0 []  - Medium Wound Dressing one or multiple wounds 0 X - Large Wound Dressing one or multiple wounds 1 20 X - Application of Medications - topical 1 5 []  - Application of Medications - injection 0 INTERVENTIONS - Miscellaneous []  - External ear exam 0 []  - Specimen Collection (cultures, biopsies, blood, body fluids, etc.) 0 []  - Specimen(s) / Culture(s) sent or taken to Lab for analysis 0 []  - Patient Transfer (multiple staff / Lift / Similar devices) 0 []  - Simple Staple / Suture removal (25 or less) 0 []  - Complex Staple / Suture removal (26 or more) 0 []  - Hypo / Hyperglycemic Management (close monitor of Blood Glucose) 0 []  - Ankle / Brachial Index (ABI) - do not check if billed separately 0 X - Vital Signs 1 5 Has the patient been seen at the hospital within the last three years: Yes Total Score: 120 Level Of Care: New/Established - Level 4 Electronic Signature(s) Signed: 09/10/2019 5:09:30 PM By: RN, BSN Entered By: on 09/10/2019 17:00:47 -------------------------------------------------------------------------------- Encounter Discharge Information Details Patient Name: Date of Service: 09/10/2019 2:45 PM Medical Record Patient Account Number: Date of Birth/Sex: Treating RN: Nov 13, 1950 (69 y.o. Primary Care Rossie Bretado: PATIENT, NO Other Clinician: Referring Sherea Liptak: Treating Araiyah Cumpton/Extender:Robson, , CAROLINE Weeks in Treatment: 2 Encounter  Discharge Information Items Discharge Condition: Stable Ambulatory Status: Cane Discharge Destination: Home Transportation: Private Auto Accompanied By: self Schedule Follow-up Appointment: Yes Clinical Summary of Care: Electronic Signature(s) Signed: 09/10/2019 5:03:02 PM By: 11/10/2019 Entered By: Zandra Abts on 09/10/2019 15:55:22 -------------------------------------------------------------------------------- Lower Extremity Assessment Details Patient Name: Date of Service: KIMM, UNGARO 09/10/2019 2:45 PM Medical Record 11/10/2019 Patient Account Number: [id number] Date of Birth/Sex: Treating RN: [birth year] (69 y.o. (75 Primary Care Charne Mcbrien: PATIENT, NO Other Clinician: Referring Aasir Daigler: Treating Maxton Noreen/Extender:Robson, [name], CAROLINE Weeks in Treatment: 2 Edema Assessment Assessed: [Left: No] [Right: No] E[Left: dema] [Right: :] Calf Left: Right: Point of Measurement: 41 cm From Medial Instep 29.5 cm cm Ankle Left: Right: Point of Measurement: 10 cm From Medial Instep 22.5 cm cm Vascular Assessment Pulses: Dorsalis Pedis Palpable: [Left:Yes] Electronic Signature(s) Signed: 09/10/2019 5:09:30 PM By: 11/10/2019 RN, BSN Signed: 09/19/2019 9:20:10 AM By: Shawn Stall Entered By: 11/10/2019 on 09/10/2019 14:26:46 -------------------------------------------------------------------------------- Multi Wound Chart Details Patient Name: Date of Service: 11/10/2019 09/10/2019 2:45 PM Medical Record [id number] Patient Account Number: 01/25/1951 Date of Birth/Sex: Treating RN: January 14, 1951 (69 y.o. Lorenso Quarry Primary Care Jamar Weatherall: PATIENT, NO Other Clinician: Referring Yurika Pereda: Treating Jill Ruppe/Extender:Robson, 11/10/2019, CAROLINE Weeks in Treatment: 2 Vital Signs Height(in): 72 Pulse(bpm): 80 Weight(lbs): 130 Blood Pressure(mmHg): 145/88 Body Mass Index(BMI): 18 Temperature(F):  98.1 Respiratory 18 Rate(breaths/min): Photos: [1:No Photos] [2:No Photos] [N/A:N/A] Wound Location: [1:Left, Distal, Anterior Lower Left, Lateral Ankle Leg] [N/A:N/A] Wounding Event: [1:Trauma] [2:Blister] [N/A:N/A] Primary Etiology: [1:Arterial Insufficiency Ulcer Arterial Insufficiency Ulcer N/A] Comorbid History: [1:Peripheral Arterial Disease Peripheral Arterial Disease N/A] Date Acquired: [1:07/08/2018] [2:08/06/2019] [N/A:N/A] Weeks of Treatment: [1:2] [2:2] [N/A:N/A] Wound Status: [1:Open] [2:Open] [N/A:N/A] Measurements L x W x D 10.2x2.7x0.3 [2:0.5x1x0.1] [N/A:N/A] (cm) Area (cm) : [1:21.63] [2:0.393] [N/A:N/A] Volume (cm) : [1:6.489] [2:0.039] [N/A:N/A] % Reduction  in Area: [1:-4.90%] [2:-42.90%] [N/A:N/A] % Reduction in Volume: -57.40% [2:-44.40%] [N/A:N/A] Classification: [1:Full Thickness Without Exposed Support Structures Exposed Support Structures] [2:Full Thickness Without] [N/A:N/A] Exudate Amount: [1:Medium] [2:Medium] [N/A:N/A] Exudate Type: [1:Serosanguineous] [2:Serosanguineous] [N/A:N/A] Exudate Color: [1:red, brown] [2:red, brown] [N/A:N/A] Wound Margin: [1:Well defined, not attached N/A] [N/A:N/A] Granulation Amount: [1:Small (1-33%)] [2:None Present (0%)] [N/A:N/A] Granulation Quality: [1:Pink] [2:N/A] [N/A:N/A] Necrotic Amount: [1:Large (67-100%)] [2:Large (67-100%)] [N/A:N/A] Necrotic Tissue: [1:Eschar, Adherent Slough Eschar, Adherent Slough N/A] Exposed Structures: [1:Fat Layer (Subcutaneous Fascia: No Tissue) Exposed: Yes Fascia: No Tendon: No Muscle: No Joint: No Bone: No Small (1-33%)] [2:Fat Layer (Subcutaneous Tissue) Exposed: No Tendon: No Muscle: No Joint: No Bone: No None] [N/A:N/A N/A] Treatment Notes Wound #1 (Left, Distal, Anterior Lower Leg) 1. Cleanse With Wound Cleanser Soap and water 2. Periwound Care Moisturizing lotion TCA Cream 3. Primary Dressing Applied Iodoflex 4. Secondary Dressing ABD Pad Dry Gauze Foam 6. Support Layer  Applied Kerlix/Coban Notes wrapped kerlix and coban lightly. netting. Wound #2 (Left, Lateral Ankle) 1. Cleanse With Wound Cleanser Soap and water 2. Periwound Care Moisturizing lotion TCA Cream 3. Primary Dressing Applied Iodoflex 4. Secondary Dressing ABD Pad Dry Gauze Foam 6. Support Layer Applied Kerlix/Coban Notes wrapped kerlix and coban lightly. netting. Electronic Signature(s) Signed: 09/10/2019 5:09:30 PM By: Zandra Abts RN, BSN Signed: 09/10/2019 5:24:04 PM By: Baltazar Najjar MD Entered By: Baltazar Najjar on 09/10/2019 16:27:43 -------------------------------------------------------------------------------- Multi-Disciplinary Care Plan Details Patient Name: Date of Service: KAMIL, MCHAFFIE 09/10/2019 2:45 PM Medical Record [id number] Patient Account Number: [id number] Date of Birth/Sex: Treating RN: 1950-12-05 (69 y.o. [name] Primary Care Caulder Wehner: PATIENT, NO Other Clinician: Referring Sahmir Weatherbee: Treating Kenyatta Gloeckner/Extender:Robson, Lorenso Quarry, CAROLINE Weeks in Treatment: 2 Active Inactive Abuse / Safety / Falls / Self Care Management Nursing Diagnoses: Potential for falls Potential for injury related to falls Goals: Patient will not experience any injury related to falls Date Initiated: 08/27/2019 Target Resolution Date: 09/28/2019 Goal Status: Active Patient/caregiver will verbalize/demonstrate measures taken to prevent injury and/or falls Date Initiated: 08/27/2019 Target Resolution Date: 09/28/2019 Goal Status: Active Interventions: Assess Activities of Daily Living upon admission and as needed Assess fall risk on admission and as needed Assess: immobility, friction, shearing, incontinence upon admission and as needed Assess impairment of mobility on admission and as needed per policy Assess personal safety and home safety (as indicated) on admission and as needed Provide education on fall prevention Provide education on  personal and home safety Notes: Tissue Oxygenation Nursing Diagnoses: Actual ineffective tissue perfusion; peripheral (select once diagnosis is confirmed) Knowledge deficit related to disease process and management Goals: Patient/caregiver will verbalize understanding of disease process and disease management Date Initiated: 08/27/2019 Target Resolution Date: 09/28/2019 Goal Status: Active Interventions: Assess patient understanding of disease process and management upon diagnosis and as needed Assess peripheral arterial status upon admission and as needed Provide education on tissue oxygenation and ischemia Notes: Wound/Skin Impairment Nursing Diagnoses: Impaired tissue integrity Knowledge deficit related to ulceration/compromised skin integrity Goals: Patient/caregiver will verbalize understanding of skin care regimen Date Initiated: 08/27/2019 Target Resolution Date: 09/28/2019 Goal Status: Active Ulcer/skin breakdown will have a volume reduction of 30% by week 4 Date Initiated: 08/27/2019 Target Resolution Date: 09/28/2019 Goal Status: Active Interventions: Assess patient/caregiver ability to obtain necessary supplies Assess patient/caregiver ability to perform ulcer/skin care regimen upon admission and as needed Assess ulceration(s) every visit Provide education on ulcer and skin care Notes: Electronic Signature(s) Signed: 09/10/2019 5:09:30 PM By: Zandra Abts RN, BSN Entered By: Zandra Abts on 09/10/2019  15:24:47 -------------------------------------------------------------------------------- Pain Assessment Details Patient Name: Date of Service: PRESTYN, STANCO 09/10/2019 2:45 PM Medical Record [id number] Patient Account Number: [id number] Date of Birth/Sex: Treating RN: September 10, 1950 (69 y.o. [name] Primary Care Kengo Sturges: PATIENT, NO Other Clinician: Referring Kaytie Ratcliffe: Treating Kaydynce Pat/Extender:Robson, Lorenso Quarry, CAROLINE Weeks in  Treatment: 2 Active Problems Location of Pain Severity and Description of Pain Patient Has Paino Yes Site Locations Rate the pain. Current Pain Level: 5 Pain Management and Medication Current Pain Management: Electronic Signature(s) Signed: 09/10/2019 5:09:30 PM By: Zandra Abts RN, BSN Signed: 09/19/2019 9:20:10 AM By: Karl Ito Entered By: Karl Ito on 09/10/2019 14:23:36 -------------------------------------------------------------------------------- Patient/Caregiver Education Details Patient Name: Date of Service: AMROM, ORE 4/5/2021andnbsp2:45 PM Medical Record [id number] Patient Account Number: [id number] Date of Birth/Gender: Treating RN: Jul 22, 1950 (69 y.o. [name] Primary Care Physician: PATIENT, NO Other Clinician: Referring Physician: Treating Physician/Extender:Robson, Lorenso Quarry, Lujean Amel in Treatment: 2 Education Assessment Education Provided To: Patient Education Topics Provided Wound/Skin Impairment: Methods: Explain/Verbal Responses: State content correctly Electronic Signature(s) Signed: 09/10/2019 5:09:30 PM By: Zandra Abts RN, BSN Entered By: Zandra Abts on 09/10/2019 15:25:22 -------------------------------------------------------------------------------- Wound Assessment Details Patient Name: Date of Service: Matthew Shutter 09/10/2019 2:45 PM Medical Record [id number] Patient Account Number: [id number] Date of Birth/Sex: Treating RN: 12-01-1950 (69 y.o. [name] Primary Care Lakiya Cottam: PATIENT, NO Other Clinician: Referring Ladina Shutters: Treating Kainen Struckman/Extender:Robson, Lorenso Quarry, CAROLINE Weeks in Treatment: 2 Wound Status Wound Number: 1 Primary Etiology: Arterial Insufficiency Ulcer Wound Location: Left, Distal, Anterior Lower Leg Wound Status: Open Wounding Event: Trauma Comorbid History: Peripheral Arterial Disease Date Acquired: 07/08/2018 Weeks Of Treatment:  2 Clustered Wound: No Wound Measurements Length: (cm) 10.2 % Reduct Width: (cm) 2.7 % Reduct Depth: (cm) 0.3 Epitheli Area: (cm) 21.63 Tunneli Volume: (cm) 6.489 Undermi Wound Description Classification: Full Thickness Without Exposed Support Foul Od Structures Slough/ Wound Well defined, not attached Margin: Exudate Medium Amount: Exudate Serosanguineous Type: Exudate red, brown Color: Wound Bed Granulation Amount: Small (1-33%) Granulation Quality: Pink Fascia E Necrotic Amount: Large (67-100%) Fat Laye Necrotic Quality: Eschar, Adherent Slough Tendon E Muscle E Joint Ex Bone Exp or After Cleansing: No Fibrino Yes Exposed Structure xposed: No r (Subcutaneous Tissue) Exposed: Yes xposed: No xposed: No posed: No osed: No ion in Area: -4.9% ion in Volume: -57.4% alization: Small (1-33%) ng: No ning: No Electronic Signature(s) Signed: 09/10/2019 5:09:30 PM By: Zandra Abts RN, BSN Signed: 09/19/2019 9:20:10 AM By: Karl Ito Entered By: Karl Ito on 09/10/2019 14:29:39 -------------------------------------------------------------------------------- Wound Assessment Details Patient Name: Date of Service: Matthew Shutter 09/10/2019 2:45 PM Medical Record [id number] Patient Account Number: [id number] Date of Birth/Sex: Treating RN: [birth year] (69 y.o. [name] Primary Care Kaoru Rezendes: PATIENT, NO Other Clinician: Referring Drake Wuertz: Treating Coe Angelos/Extender:Robson, Lorenso Quarry, CAROLINE Weeks in Treatment: 2 Wound Status Wound Number: 2 Primary Etiology: Arterial Insufficiency Ulcer Wound Location: Left, Lateral Ankle Wound Status: Open Wounding Event: Blister Comorbid History: Peripheral Arterial Disease Date Acquired: 08/06/2019 Weeks Of Treatment: 2 Clustered Wound: No Wound Measurements Length: (cm) 0.5 % Reduct Width: (cm) 1 % Reduct Depth: (cm) 0.1 Epitheli Area: (cm) 0.393 Tunneli Volume: (cm) 0.039  Undermi Wound Description Classification: Full Thickness Without Exposed Support Foul Odo Structures Slough/F Exudate Medium Amount: Exudate Serosanguineous Type: Exudate red, brown Color: Wound Bed Granulation Amount: None Present (0%) Necrotic Amount: Large (67-100%) Fascia E Necrotic Quality: Eschar, Adherent Slough Fat Laye Tendon E Muscle E Joint Ex Bone Exp r After Cleansing: No ibrino Yes Exposed Structure xposed: No  r (Subcutaneous Tissue) Exposed: No xposed: No xposed: No posed: No osed: No ion in Area: -42.9% ion in Volume: -44.4% alization: None ng: No ning: No Electronic Signature(s) Signed: 09/10/2019 5:09:30 PM By: Levan Hurst RN, BSN Signed: 09/19/2019 9:20:10 AM By: Sandre Kitty Entered By: Sandre Kitty on 09/10/2019 14:29:58 -------------------------------------------------------------------------------- [place] Details Patient Name: Date of Service: [name]. 09/10/2019 2:45 PM Medical Record [id number] Patient Account Number: [id number] Date of Birth/Sex: Treating RN: 1950/06/25 (68 y.o. [name] Primary Care Nathanael Krist: PATIENT, NO Other Clinician: Referring Tyrah Broers: Treating Adie Vilar/Extender:Robson, Pablo Ledger, CAROLINE Weeks in Treatment: 2 Vital Signs Time Taken: 14:23 Temperature (F): 98.1 Height (in): 72 Pulse (bpm): 80 Weight (lbs): 130 Respiratory Rate (breaths/min): 18 Body Mass Index (BMI): 17.6 Blood Pressure (mmHg): 145/88 Reference Range: 80 - 120 mg / dl Electronic Signature(s) Signed: 09/19/2019 9:20:10 AM By: Sandre Kitty Entered By: Sandre Kitty on 09/10/2019 14:23:21

## 2019-09-24 ENCOUNTER — Encounter (HOSPITAL_BASED_OUTPATIENT_CLINIC_OR_DEPARTMENT_OTHER): Payer: Medicare HMO | Admitting: Internal Medicine

## 2019-09-24 ENCOUNTER — Other Ambulatory Visit: Payer: Self-pay

## 2019-09-24 DIAGNOSIS — I70248 Atherosclerosis of native arteries of left leg with ulceration of other part of lower left leg: Secondary | ICD-10-CM | POA: Diagnosis not present

## 2019-09-24 DIAGNOSIS — L97322 Non-pressure chronic ulcer of left ankle with fat layer exposed: Secondary | ICD-10-CM | POA: Diagnosis not present

## 2019-09-24 DIAGNOSIS — I70242 Atherosclerosis of native arteries of left leg with ulceration of calf: Secondary | ICD-10-CM | POA: Diagnosis not present

## 2019-09-24 DIAGNOSIS — L97828 Non-pressure chronic ulcer of other part of left lower leg with other specified severity: Secondary | ICD-10-CM | POA: Diagnosis not present

## 2019-09-24 DIAGNOSIS — R6889 Other general symptoms and signs: Secondary | ICD-10-CM | POA: Diagnosis not present

## 2019-09-24 DIAGNOSIS — L97822 Non-pressure chronic ulcer of other part of left lower leg with fat layer exposed: Secondary | ICD-10-CM | POA: Diagnosis not present

## 2019-09-24 DIAGNOSIS — I70243 Atherosclerosis of native arteries of left leg with ulceration of ankle: Secondary | ICD-10-CM | POA: Diagnosis not present

## 2019-09-24 NOTE — Progress Notes (Signed)
BECKEM, TOMBERLIN ([id number]) Visit Report for 09/24/2019 Fall Risk Assessment Details Patient Name: Date of Service: Matthew [name], Matthew [name] 09/24/2019 3:00 PM Medical Record [id number] Patient Account Number: [id number] Date of Birth/Sex: Treating RN: 1951/03/25 (69 y.o. [name] Primary Care Jamerson Vonbargen: PATIENT, NO Other Clinician: Referring Malikah Principato: Treating Johathon Overturf/Extender:Robson, Lorenso Quarry, CAROLINE Weeks in Treatment: 4 Fall Risk Assessment Items Have you had 2 or more falls in the last 12 monthso 0 Yes Have you had any fall that resulted in injury in the last 12 monthso 0 No FALLS RISK SCREEN History of falling - immediate or within 3 months 25 Yes Secondary diagnosis (Do you have 2 or more medical diagnoseso) 0 No Ambulatory aid None/bed rest/wheelchair/nurse 0 No Crutches/cane/walker 15 Yes Furniture 0 No Intravenous therapy Access/Saline/Heparin Lock 0 No Weak (short steps with or without shuffle, stooped but able to lift head 0 No while walking, may seek support from furniture) Impaired (short steps with shuffle, may have difficulty arising from chair, 0 No head down, impaired balance) Mental Status Oriented to own ability 0 Yes Overestimates or forgets limitations 0 No Risk Level: Medium Risk Score: 40 Electronic Signature(s) Signed: 09/24/2019 6:02:31 PM By: Zandra Abts RN, BSN Entered By: Zandra Abts on 09/24/2019 16:02:14

## 2019-09-25 NOTE — Progress Notes (Signed)
Matthew [name], Antawn T. ([id number]) Visit Report for 09/24/2019 Arrival Information Details Patient Name: Date of Service: Matthew [name], Matthew T. 09/24/2019 3:00 PM Medical Record [id number] Patient Account Number: [id number] Date of Birth/Sex: Treating RN: 12-19-1950 (69 y.o. [name]) Lynch, Shatara Primary Care Jilberto Vanderwall: PATIENT, NO Other Clinician: Referring Kerra Guilfoil: Treating Inaara Tye/Extender:Robson, Lorenso QuarryMichael ABERMAN, CAROLINE Weeks in Treatment: 4 Visit Information History Since Last Visit Cane All ordered tests and consults were completed: No Patient Arrived: 15:34 Added or deleted any medications: No Arrival Time: Any new allergies or adverse reactions: No Accompanied By: self None Had a fall or experienced change in Yes Transfer Assistance: activities of daily living that may affect Patient Identification Verified: Yes risk of falls: Secondary Verification Process Completed: Yes Signs or symptoms of abuse/neglect since last No Patient Requires Transmission-Based No visito Precautions: Hospitalized since last visit: No Patient Has Alerts: No Implantable device outside of the clinic excluding No cellular tissue based products placed in the center since last visit: Has Dressing in Place as Prescribed: Yes Pain Present Now: Yes Notes Mr Chancy Milroyhaxton expressed to me that he has been falling all week since the last time he has seen us, he says that because his legs are so swollen and tender he cannot walk well. He states he has not hurt his self from falling but can't seen to keep his balance. Electronic Signature(s) Signed: 09/25/2019 1:29:28 PM By: Karl Itoawkins, Destiny Entered By: Karl Itoawkins, Destiny on 09/24/2019 15:36:43 -------------------------------------------------------------------------------- Clinic Level of Care Assessment Details Patient Name: Date of Service: Matthew [name], Matthew T. 09/24/2019 3:00 PM Medical Record [id number] Patient Account Number: [id number] Date of  Birth/Sex: Treating RN: 12-19-1950 (69 y.o. [name]) Lynch, Shatara Primary Care Clarabel Marion: PATIENT, NO Other Clinician: Referring Kyliana Standen: Treating Rolonda Pontarelli/Extender:Robson, Lorenso QuarryMichael ABERMAN, CAROLINE Weeks in Treatment: 4 Clinic Level of Care Assessment Items TOOL 4 Quantity Score X - Use when only an EandM is performed on FOLLOW-UP visit 1 0 ASSESSMENTS - Nursing Assessment / Reassessment X - Reassessment of Co-morbidities (includes updates in patient status) 1 10 X - Reassessment of Adherence to Treatment Plan 1 5 ASSESSMENTS - Wound and Skin Assessment / Reassessment []  - Simple Wound Assessment / Reassessment - one wound 0 X - Complex Wound Assessment / Reassessment - multiple wounds 4 5 []  - Dermatologic / Skin Assessment (not related to wound area) 0 ASSESSMENTS - Focused Assessment []  - Circumferential Edema Measurements - multi extremities 0 []  - Nutritional Assessment / Counseling / Intervention 0 X - Lower Extremity Assessment (monofilament, tuning fork, pulses) 1 5 []  - Peripheral Arterial Disease Assessment (using hand held doppler) 0 ASSESSMENTS - Ostomy and/or Continence Assessment and Care []  - Incontinence Assessment and Management 0 []  - Ostomy Care Assessment and Management (repouching, etc.) 0 PROCESS - Coordination of Care X - Simple Patient / Family Education for ongoing care 1 15 []  - Complex (extensive) Patient / Family Education for ongoing care 0 X - Staff obtains ChiropractorConsents, Records, Test Results / Process Orders 1 10 []  - Staff telephones HHA, Nursing Homes / Clarify orders / etc 0 []  - Routine Transfer to another Facility (non-emergent condition) 0 []  - Routine Hospital Admission (non-emergent condition) 0 []  - New Admissions / Manufacturing engineernsurance Authorizations / Ordering NPWT, Apligraf, etc. 0 []  - Emergency Hospital Admission (emergent condition) 0 X - Simple Discharge Coordination 1 10 []  - Complex (extensive) Discharge Coordination 0 PROCESS - Special Needs []  -  Pediatric / Minor Patient Management 0 []  - Isolation Patient Management 0 []  - Hearing / Language / Visual special  needs 0 []  - Assessment of Community assistance (transportation, D/C planning, etc.) 0 []  - Additional assistance / Altered mentation 0 []  - Support Surface(s) Assessment (bed, cushion, seat, etc.) 0 INTERVENTIONS - Wound Cleansing / Measurement []  - Simple Wound Cleansing - one wound 0 X - Complex Wound Cleansing - multiple wounds 4 5 X - Wound Imaging (photographs - any number of wounds) 1 5 []  - Wound Tracing (instead of photographs) 0 []  - Simple Wound Measurement - one wound 0 X - Complex Wound Measurement - multiple wounds 4 5 INTERVENTIONS - Wound Dressings []  - Small Wound Dressing one or multiple wounds 0 []  - Medium Wound Dressing one or multiple wounds 0 X - Large Wound Dressing one or multiple wounds 2 20 []  - Application of Medications - topical 0 []  - Application of Medications - injection 0 INTERVENTIONS - Miscellaneous []  - External ear exam 0 []  - Specimen Collection (cultures, biopsies, blood, body fluids, etc.) 0 []  - Specimen(s) / Culture(s) sent or taken to Lab for analysis 0 []  - Patient Transfer (multiple staff / / Similar devices) 0 []  - Simple Staple / Suture removal (25 or less) 0 []  - Complex Staple / Suture removal (26 or more) 0 []  - Hypo / Hyperglycemic Management (close monitor of Blood Glucose) 0 []  - Ankle / Brachial Index (ABI) - do not check if billed separately 0 X - Vital Signs 1 5 Has the patient been seen at the hospital within the last three years: Yes Total Score: 165 Level Of Care: New/Established - Level 5 Electronic Signature(s) Signed: 09/24/2019 6:02:31 PM By: RN, BSN Entered By: on 09/24/2019 17:58:17 -------------------------------------------------------------------------------- Encounter Discharge Information Details Patient Name: Date of Service: T. 09/24/2019  3:00 PM Medical Record Patient Account Number: Date of Birth/Sex: Treating RN: [birth year] (69 y.o. Primary Care Lauranne Beyersdorf: PATIENT, NO Other Clinician: Referring Carrin Vannostrand: Treating Haze Antillon/Extender:Robson, , CAROLINE Weeks in Treatment: 4 Encounter Discharge Information Items Discharge Condition: Stable Ambulatory Status: Ambulatory Discharge Destination: Home Transportation: Private Auto Accompanied By: self Schedule Follow-up Appointment: Yes Clinical Summary of Care: Patient Declined Electronic Signature(s) Signed: 09/24/2019 5:36:36 PM By: Nurse, adult Entered By: on 09/24/2019 16:20:56 -------------------------------------------------------------------------------- Lower Extremity Assessment Details Patient Name: Date of Service: LYRIC, HOAR 09/24/2019 3:00 PM Medical Record 09/26/2019 Patient Account Number: Zandra Abts Date of Birth/Sex: Treating RN: August 24, 1950 (69 y.o. Franki Monte Primary Care Danae Oland: PATIENT, NO Other Clinician: Referring Adalyn Pennock: Treating Phong Isenberg/Extender:Robson, 09/26/2019, CAROLINE Weeks in Treatment: 4 Edema Assessment Assessed: [Left: No] [Right: No] E[Left: dema] [Right: :] Calf Left: Right: Point of Measurement: 41 cm From Medial Instep 30.5 cm cm Ankle Left: Right: Point of Measurement: 10 cm From Medial Instep 22.5 cm cm Vascular Assessment Pulses: Dorsalis Pedis Palpable: [Left:Yes] Electronic Signature(s) Signed: 09/24/2019 6:02:31 PM By: [id number] RN, BSN Entered By: 01/25/1951 on 09/24/2019 16:00:28 -------------------------------------------------------------------------------- Multi Wound Chart Details Patient Name: Date of Service: Katherina Right T. 09/24/2019 3:00 PM Medical Record 09/26/2019 Patient Account Number: Cherylin Mylar Date of Birth/Sex: Treating RN: 04-19-1951 (69 y.o. Matthew Shutter Primary Care Delphia Kaylor: PATIENT, NO Other Clinician: Referring Willys Salvino: Treating Shaneece Stockburger/Extender:Robson, 09/26/2019, CAROLINE Weeks in Treatment: 4 Vital Signs Height(in): 72 Pulse(bpm): 76 Weight(lbs): 130 Blood Pressure(mmHg): 120/52 Body Mass Index(BMI): 18 Temperature(F): 97.9 Respiratory 18 Rate(breaths/min): Photos: [1:No Photos] [2:No Photos] [3:No Photos] Wound Location: [1:Left, Distal, Anterior Lower Left, Lateral Ankle Leg] [3:Left, Distal, Lateral Lower Leg] Wounding Event: [  1:Trauma] [2:Blister] [3:Gradually Appeared] Primary Etiology: [1:Arterial Insufficiency Ulcer Arterial Insufficiency Ulcer Arterial Insufficiency Ulcer] Comorbid History: [1:Peripheral Arterial Disease Peripheral Arterial Disease Peripheral Arterial Disease] Date Acquired: [1:07/08/2018] [2:08/06/2019] [3:09/17/2019] Weeks of Treatment: [1:4] [2:4] [3:1] Wound Status: [1:Open] [2:Open] [3:Open] Measurements L x W x D 10.6x3.5x0.2 [2:2x1x0.1] [3:0.4x0.3x0.1] (cm) Area (cm) : [1:29.138] [2:1.571] [3:0.094] Volume (cm) : [1:5.828] [2:0.157] [3:0.009] % Reduction in Area: [1:-41.30%] [2:-471.30%] [3:52.00%] % Reduction in Volume: -41.40% [2:-481.50%] [3:55.00%] Classification: [1:Full Thickness Without Exposed Support Structures Exposed Support Structures Exposed Support Structures] [2:Full Thickness Without] [3:Full Thickness Without] Exudate Amount: [1:Medium] [2:Small] [3:Medium] Exudate Type: [1:Serosanguineous] [2:Serosanguineous] [3:Serosanguineous] Exudate Color: [1:red, brown] [2:red, brown] [3:red, brown] Wound Margin: [1:Well defined, not attached Distinct, outline attached Distinct, outline attached] Granulation Amount: [1:Small (1-33%)] [2:None Present (0%)] [3:Large (67-100%)] Granulation Quality: [1:Pink] [2:N/A] [3:Pink] Necrotic Amount: [1:Large (67-100%)] [2:Large (67-100%)] [3:Small (1-33%)] Necrotic Tissue: [1:Eschar, Adherent Slough] [2:Eschar] [3:Adherent  Slough] Exposed Structures: [1:Fat Layer (Subcutaneous Tissue) Exposed: Yes Fascia: No Tendon: No Muscle: No Joint: No Bone: No] [2:Fascia: No Fat Layer (Subcutaneous Tissue) Exposed: No Tendon: No Muscle: No Joint: No Bone: No] [3:Fat Layer (Subcutaneous Tissue) Exposed: Yes  Fascia: No Tendon: No Muscle: No Joint: No Bone: No] Epithelialization: [1:Small (1-33%)] [2:None 4 N/A] [3:None N/A] Photos: [1:No Photos] [2:N/A] [3:N/A] Wound Location: [1:Right, Dorsal Foot] [2:N/A] [3:N/A] Wounding Event: [1:Blister] [2:N/A] [3:N/A] Primary Etiology: [1:Skin Tear] [2:N/A] [3:N/A] Comorbid History: [1:Peripheral Arterial Disease] [2:N/A] [3:N/A] Date Acquired: [1:09/24/2019] [2:N/A] [3:N/A] Weeks of Treatment: [1:0] [2:N/A] [3:N/A] Wound Status: [1:Open] [2:N/A] [3:N/A] Measurements L x W x D [1:2.5x2.5x0.1] [2:N/A] [3:N/A] (cm) Area (cm) : [9:7.353] [2:N/A] [3:N/A] Volume (cm) : [1:0.491] [2:N/A] [3:N/A] % Reduction in Area: [1:N/A] [2:N/A] [3:N/A] % Reduction in Volume: [1:N/A] [2:N/A] [3:N/A] Classification: [1:Partial Thickness] [2:N/A] [3:N/A] Exudate Amount: [1:Medium] [2:N/A] [3:N/A] Exudate Type: [1:Serous] [2:N/A] [3:N/A] Exudate Color: [1:amber] [2:N/A] [3:N/A] Wound Margin: [1:Flat and Intact] [2:N/A] [3:N/A] Granulation Amount: [1:Large (67-100%)] [2:N/A] [3:N/A] Granulation Quality: [1:Pink, Pale] [2:N/A] [3:N/A] Necrotic Amount: [1:None Present (0%)] [2:N/A] [3:N/A] Necrotic Tissue: [1:N/A] [2:N/A] [3:N/A] Exposed Structures: [1:Fascia: No Fat Layer (Subcutaneous Tissue) Exposed: No Tendon: No Muscle: No Joint: No Bone: No None] [2:N/A N/A] [3:N/A N/A] Treatment Notes Wound #1 (Left, Distal, Anterior Lower Leg) 1. Cleanse With Wound Cleanser Soap and water 2. Periwound Care Barrier cream Moisturizing lotion TCA Cream 3. Primary Dressing Applied Hydrofera Blue 4. Secondary Dressing ABD Pad 6. Support Layer Applied Kerlix/Coban Notes netting Wound #2 (Left,  Lateral Ankle) 1. Cleanse With Wound Cleanser Soap and water 2. Periwound Care Barrier cream Moisturizing lotion TCA Cream 3. Primary Dressing Applied Hydrofera Blue 4. Secondary Dressing ABD Pad 6. Support Layer Applied Kerlix/Coban Notes netting Wound #3 (Left, Distal, Lateral Lower Leg) 1. Cleanse With Wound Cleanser Soap and water 2. Periwound Care Barrier cream Moisturizing lotion TCA Cream 3. Primary Dressing Applied Hydrofera Blue 4. Secondary Dressing ABD Pad 6. Support Layer Applied Kerlix/Coban Notes netting Wound #4 (Right, Dorsal Foot) 1. Cleanse With Wound Cleanser 2. Periwound Care Barrier cream Moisturizing lotion TCA Cream 3. Primary Dressing Applied Calcium Alginate 4. Secondary Dressing ABD Pad 6. Support Layer Applied Kerlix/Coban Notes Horticulturist, commercial) Signed: 09/24/2019 5:34:04 PM By: Linton Ham MD Signed: 09/24/2019 6:02:31 PM By: Levan Hurst RN, BSN Entered By: Linton Ham on 09/24/2019 17:13:31 -------------------------------------------------------------------------------- Multi-Disciplinary Care Plan Details Patient Name: Date of Service: [name] 09/24/2019 3:00 PM Medical Record [id number] Patient Account Number: [id number] Date of Birth/Sex: Treating RN: [birth year] (69 y.o. [name] Primary Care Maribeth Jiles:  PATIENT, NO Other Clinician: Referring Lorilee Cafarella: Treating Colt Martelle/Extender:Robson, Lorenso Quarry, CAROLINE Weeks in Treatment: 4 Active Inactive Abuse / Safety / Falls / Self Care Management Nursing Diagnoses: Potential for falls Potential for injury related to falls Goals: Patient will not experience any injury related to falls Date Initiated: 08/27/2019 Date Inactivated: 09/24/2019 Target Resolution Date: 09/28/2019 Goal Status: Unmet Unmet Reason: multiple falls Patient/caregiver will verbalize/demonstrate measures taken to prevent injury and/or falls Date  Initiated: 08/27/2019 Target Resolution Date: 10/26/2019 Goal Status: Active Interventions: Assess Activities of Daily Living upon admission and as needed Assess fall risk on admission and as needed Assess: immobility, friction, shearing, incontinence upon admission and as needed Assess impairment of mobility on admission and as needed per policy Assess personal safety and home safety (as indicated) on admission and as needed Provide education on fall prevention Provide education on personal and home safety Notes: Tissue Oxygenation Nursing Diagnoses: Actual ineffective tissue perfusion; peripheral (select once diagnosis is confirmed) Knowledge deficit related to disease process and management Goals: Patient/caregiver will verbalize understanding of disease process and disease management Date Initiated: 08/27/2019 Target Resolution Date: 10/26/2019 Goal Status: Active Interventions: Assess patient understanding of disease process and management upon diagnosis and as needed Assess peripheral arterial status upon admission and as needed Provide education on tissue oxygenation and ischemia Notes: Wound/Skin Impairment Nursing Diagnoses: Impaired tissue integrity Knowledge deficit related to ulceration/compromised skin integrity Goals: Patient/caregiver will verbalize understanding of skin care regimen Date Initiated: 08/27/2019 Target Resolution Date: 10/26/2019 Goal Status: Active Ulcer/skin breakdown will have a volume reduction of 30% by week 4 Date Initiated: 08/27/2019 Date Inactivated: 09/24/2019 Target Resolution Date: 09/28/2019 Unmet Reason: PAD, non Goal Status: Unmet viable surface Interventions: Assess patient/caregiver ability to obtain necessary supplies Assess patient/caregiver ability to perform ulcer/skin care regimen upon admission and as needed Assess ulceration(s) every visit Provide education on ulcer and skin care Notes: Electronic Signature(s) Signed:  09/24/2019 6:02:31 PM By: Zandra Abts RN, BSN Entered By: Zandra Abts on 09/24/2019 16:06:14 -------------------------------------------------------------------------------- Pain Assessment Details Patient Name: Date of Service: Franki Monte T. 09/24/2019 3:00 PM Medical Record [id number] Patient Account Number: [id number] Date of Birth/Sex: Treating RN: 12/27/1950 (69 y.o. [name] Primary Care Yissel Habermehl: PATIENT, NO Other Clinician: Referring Kerin Kren: Treating Coy Vandoren/Extender:Robson, Lorenso Quarry, CAROLINE Weeks in Treatment: 4 Active Problems Location of Pain Severity and Description of Pain Patient Has Paino Yes Site Locations Rate the pain. Current Pain Level: 10 Pain Management and Medication Current Pain Management: Electronic Signature(s) Signed: 09/24/2019 6:02:31 PM By: Zandra Abts RN, BSN Signed: 09/25/2019 1:29:28 PM By: Karl Ito Entered By: Karl Ito on 09/24/2019 15:37:08 -------------------------------------------------------------------------------- Patient/Caregiver Education Details Patient Name: Date of Service: Scibilia, Carlus T. 4/19/2021andnbsp3:00 PM Medical Record [id number] Patient Account Number: [id number] Date of Birth/Gender: Treating RN: 1951-05-01 (69 y.o. [name] Primary Care Physician: PATIENT, NO Other Clinician: Referring Physician: Treating Physician/Extender:Robson, Lorenso Quarry, Lujean Amel in Treatment: 4 Education Assessment Education Provided To: Patient Education Topics Provided Safety: Methods: Explain/Verbal Responses: State content correctly Wound/Skin Impairment: Methods: Explain/Verbal Electronic Signature(s) Signed: 09/24/2019 6:02:31 PM By: Zandra Abts RN, BSN Entered By: Zandra Abts on 09/24/2019 16:06:30 -------------------------------------------------------------------------------- Wound Assessment Details Patient Name: Date of  Service: Franki Monte T. 09/24/2019 3:00 PM Medical Record [id number] Patient Account Number: [id number] Date of Birth/Sex: Treating RN: December 12, 1950 (69 y.o. [name] Primary Care Zhanna Melin: PATIENT, NO Other Clinician: Referring Sinda Leedom: Treating Alonzo Owczarzak/Extender:Robson, Lorenso Quarry, CAROLINE Weeks in Treatment: 4 Wound Status Wound Number: 1 Primary Etiology: Arterial Insufficiency Ulcer Wound Location: Left,  Distal, Anterior Lower Leg Wound Status: Open Wounding Event: Trauma Comorbid History: Peripheral Arterial Disease Date Acquired: 07/08/2018 Weeks Of Treatment: 4 Clustered Wound: No Wound Measurements Length: (cm) 10.6 % Reduct Width: (cm) 3.5 % Reduct Depth: (cm) 0.2 Epitheli Area: (cm) 29.138 Tunneli Volume: (cm) 5.828 Undermi Wound Description Full Thickness Without Exposed Support Foul Odo Classification: Structures Slough/F Wound Well defined, not attached Margin: Exudate Medium Amount: Exudate Serosanguineous Type: Exudate red, brown Color: Wound Bed Granulation Amount: Small (1-33%) Granulation Quality: Pink Fascia E Necrotic Amount: Large (67-100%) Fat Laye Necrotic Quality: Eschar, Adherent Slough Tendon E Muscle E Joint Ex Bone Exp r After Cleansing: No ibrino Yes Exposed Structure xposed: No r (Subcutaneous Tissue) Exposed: Yes xposed: No xposed: No posed: No osed: No ion in Area: -41.3% ion in Volume: -41.4% alization: Small (1-33%) ng: No ning: No Treatment Notes Wound #1 (Left, Distal, Anterior Lower Leg) 1. Cleanse With Wound Cleanser Soap and water 2. Periwound Care Barrier cream Moisturizing lotion TCA Cream 3. Primary Dressing Applied Hydrofera Blue 4. Secondary Dressing ABD Pad 6. Support Layer Applied Kerlix/Coban Notes Government social research officer) Signed: 09/24/2019 6:02:31 PM By: Zandra Abts RN, BSN Entered By: Zandra Abts on 09/24/2019  16:00:50 -------------------------------------------------------------------------------- Wound Assessment Details Patient Name: Date of Service: Franki Monte T. 09/24/2019 3:00 PM Medical Record [id number] Patient Account Number: [id number] Date of Birth/Sex: Treating RN: 1950-08-30 (69 y.o. [name] Primary Care Dyane Broberg: PATIENT, NO Other Clinician: Referring Armoni Kludt: Treating Lorianne Malbrough/Extender:Robson, Lorenso Quarry, CAROLINE Weeks in Treatment: 4 Wound Status Wound Number: 2 Primary Etiology: Arterial Insufficiency Ulcer Wound Location: Left, Lateral Ankle Wound Status: Open Wounding Event: Blister Comorbid History: Peripheral Arterial Disease Date Acquired: 08/06/2019 Weeks Of Treatment: 4 Clustered Wound: No Wound Measurements Length: (cm) 2 % Reduct Width: (cm) 1 % Reduct Depth: (cm) 0.1 Epitheli Area: (cm) 1.571 Tunneli Volume: (cm) 0.157 Wound Description Classification: Full Thickness Without Exposed Support Foul Odo Structures Slough/F Wound Distinct, outline attached Margin: Exudate Small Amount: Exudate Serosanguineous Type: Exudate red, brown Color: Wound Bed Granulation Amount: None Present (0%) Necrotic Amount: Large (67-100%) Fascia E Necrotic Quality: Eschar Fat Layer Tendon Exp Muscle Exp Joint Expo Bone Expos r After Cleansing: No ibrino Yes Exposed Structure xposed: No (Subcutaneous Tissue) Exposed: No osed: No osed: No sed: No ed: No ion in Area: -471.3% ion in Volume: -481.5% alization: None ng: No Treatment Notes Wound #2 (Left, Lateral Ankle) 1. Cleanse With Wound Cleanser Soap and water 2. Periwound Care Barrier cream Moisturizing lotion TCA Cream 3. Primary Dressing Applied Hydrofera Blue 4. Secondary Dressing ABD Pad 6. Support Layer Applied Kerlix/Coban Notes Government social research officer) Signed: 09/24/2019 6:02:31 PM By: Zandra Abts RN, BSN Entered By: Zandra Abts on 09/24/2019  16:01:09 -------------------------------------------------------------------------------- Wound Assessment Details Patient Name: Date of Service: Franki Monte T. 09/24/2019 3:00 PM Medical Record [id number] Patient Account Number: [id number] Date of Birth/Sex: Treating RN: 12-15-1950 (69 y.o. [name] Primary Care Nakul Avino: PATIENT, NO Other Clinician: Referring Jolita Haefner: Treating Loriann Bosserman/Extender:Robson, Lorenso Quarry, CAROLINE Weeks in Treatment: 4 Wound Status Wound Number: 3 Primary Etiology: Arterial Insufficiency Ulcer Wound Location: Left, Distal, Lateral Lower Leg Wound Status: Open Wounding Event: Gradually Appeared Comorbid History: Peripheral Arterial Disease Date Acquired: 09/17/2019 Weeks Of Treatment: 1 Clustered Wound: No Wound Measurements Length: (cm) 0.4 % Reductio Width: (cm) 0.3 % Reductio Depth: (cm) 0.1 Epithelial Area: (cm) 0.094 Tunneling Volume: (cm) 0.009 Undermini Wound Description Classification: Full Thickness Without Exposed Support Foul Odo Structures Slough/F Wound Distinct, outline attached Margin: Exudate Medium Amount:  Exudate Serosanguineous Type: Exudate red, brown Color: Wound Bed Granulation Amount: Large (67-100%) Granulation Quality: Pink Fascia E Necrotic Amount: Small (1-33%) Fat Laye Necrotic Quality: Adherent Slough Tendon E Muscle E Joint Ex Bone Exp r After Cleansing: No ibrino Yes Exposed Structure xposed: No r (Subcutaneous Tissue) Exposed: Yes xposed: No xposed: No posed: No osed: No n in Area: 52% n in Volume: 55% ization: None : No ng: No Treatment Notes Wound #3 (Left, Distal, Lateral Lower Leg) 1. Cleanse With Wound Cleanser Soap and water 2. Periwound Care Barrier cream Moisturizing lotion TCA Cream 3. Primary Dressing Applied Hydrofera Blue 4. Secondary Dressing ABD Pad 6. Support Layer Applied Kerlix/Coban Notes Government social research officer) Signed:  09/24/2019 6:02:31 PM By: Zandra Abts RN, BSN Entered By: Zandra Abts on 09/24/2019 16:01:25 -------------------------------------------------------------------------------- Wound Assessment Details Patient Name: Date of Service: Franki Monte T. 09/24/2019 3:00 PM Medical Record [id number] Patient Account Number: [id number] Date of Birth/Sex: Treating RN: [birth year] (69 y.o. [name] Primary Care Benjamim Harnish: PATIENT, NO Other Clinician: Referring Saiya Crist: Treating Deen Deguia/Extender:Robson, Lorenso Quarry, CAROLINE Weeks in Treatment: 4 Wound Status Wound Number: 4 Primary Etiology: Skin Tear Wound Location: Right, Dorsal Foot Wound Status: Open Wounding Event: Blister Comorbid History: Peripheral Arterial Disease Date Acquired: 09/24/2019 Weeks Of Treatment: 0 Clustered Wound: No Wound Measurements Length: (cm) 2.5 % Reduction Width: (cm) 2.5 % Reduction Depth: (cm) 0.1 Epitheliali Area: (cm) 4.909 Tunneling: Volume: (cm) 0.491 Underminin Wound Description Classification: Partial Thickness Foul Odor A Wound Margin: Flat and Intact Slough/Fibr Exudate Amount: Medium Exudate Type: Serous Exudate Color: amber Wound Bed Granulation Amount: Large (67-100%) Granulation Quality: Pink, Pale Fascia Expo Necrotic Amount: None Present (0%) Fat Layer ( Tendon Expo Muscle Expo Joint Expos Bone Expose fter Cleansing: No ino No Exposed Structure sed: No Subcutaneous Tissue) Exposed: No sed: No sed: No ed: No d: No in Area: in Volume: zation: None No g: No Treatment Notes Wound #4 (Right, Dorsal Foot) 1. Cleanse With Wound Cleanser 2. Periwound Care Barrier cream Moisturizing lotion TCA Cream 3. Primary Dressing Applied Calcium Alginate 4. Secondary Dressing ABD Pad 6. Support Layer Applied Kerlix/Coban Notes [employer] social research officer) Signed: 09/24/2019 6:02:31 PM By: Zandra Abts RN, BSN Entered By: Zandra Abts on  09/24/2019 16:08:23 -------------------------------------------------------------------------------- Vitals Details Patient Name: Date of Service: Franki Monte T. 09/24/2019 3:00 PM Medical Record [id number] Patient Account Number: [id number] Date of Birth/Sex: Treating RN: 02-Dec-1950 (69 y.o. [name] Primary Care Kaulin Chaves: PATIENT, NO Other Clinician: Referring Evynn Boutelle: Treating Katura Eatherly/Extender:Robson, Lorenso Quarry, CAROLINE Weeks in Treatment: 4 Vital Signs Time Taken: 15:36 Temperature (F): 97.9 Height (in): 72 Pulse (bpm): 76 Weight (lbs): 130 Respiratory Rate (breaths/min): 18 Body Mass Index (BMI): 17.6 Blood Pressure (mmHg): 120/52 Reference Range: 80 - 120 mg / dl Electronic Signature(s) Signed: 09/25/2019 1:29:28 PM By: Karl Ito Entered By: Karl Ito on 09/24/2019 15:37:00

## 2019-09-25 NOTE — Progress Notes (Signed)
Matthew [name], Matthew [name] ([id number]) Visit Report for 09/24/2019 HPI Details Patient Name: Date of Service: Matthew [name], Matthew [name] 09/24/2019 3:00 PM Medical Record [id number] Patient Account Number: [id number] Date of Birth/Sex: Treating RN: September 13, 1950 (69 y.o. Matthew [name] Primary Care Provider: PATIENT, NO Other Clinician: Referring Provider: Treating Provider/Extender:Camryn Quesinberry, Lorenso Quarry, CAROLINE Weeks in Treatment: 4 History of Present Illness HPI Description: ADMISSION 08/27/2019 This is a 69 year old man who tells me that he fell going up stairs about a year ago resulting in a deep laceration type wound on the left anterior mid tibia. He was soon found to have significant PAD/critical limb ischemia. He had had previous noninvasive studies in 2017 showing an ABI of 0.82 on the right and 0.91 on the left with monophasic to biphasic waveforms. On 07/18/2019 his ABI in the right was 0.58 on the left 0.52 with TBI's of 0.51 on the right and 0 on the left. He underwent an angiogram by Dr. Randie Heinz on 3/1. He underwent bilateral iliac artery stents he was also discovered to have a left-sided SFA with it was patent up to 10 centimeters proximal to the adductor canal but then became stenotic with a 95% stenosis for 15 cm. Dominant runoff is via the posterior tibial artery. The patient still has the wound on the left anterior mid tibia. He has been using Vaseline to this. He wraps this and gauze and some form of Ace wrap. He complains bitterly of the pain also complains about falling with the leg giving out on him. About 2 weeks ago he developed a new wound on the left lateral ankle. He had a DVT rule out study on 3/16/ 1 that was negative for a DVT Past medical history includes cervical spondylosis, left carotid bruit, marked PAD. ABI in our clinic was 0.71 on the left 3/29; patient admitted to the clinic last week with a laceration type injury on the left anterior mid tibia in the  setting of severe PAD. He was supposed to follow-up with Dr. Randie Heinz last week but he tells me he had to cancel the appointment because of nausea and vomiting. I have encouraged him to rebook this ASAP. He tolerated our kerlix Coban wrap without incidence that the pain was better. We used Iodoflex to the wound area 4/5; working on getting him back in with Dr. Randie Heinz. We have been using Iodoflex under Curlex and Coban. He seems to be tolerating this 4/12; still complaining of a lot of pain. Wound itself on the left anterior mid tibia looks about the same perhaps somewhat better looking tissue. he has a new area on the left medial lower leg and ankle. He has previously had bilateral external iliac artery stents. Still complaining of a lot of pain 4/16; still complaining of a lot of pain. He has noninvasive studies on Friday morning and sees Dr. Randie Heinz after this. Large wound on the left anterior tibia. Smaller areas on the left medial lower leg and ankle. He arrives in clinic today with blisters on his dorsal right leg is swollen Electronic Signature(s) Signed: 09/24/2019 5:34:04 PM By: Baltazar Najjar MD Entered By: Baltazar Najjar on 09/24/2019 17:14:39 -------------------------------------------------------------------------------- Physical Exam Details Patient Name: Date of Service: Matthew Monte T. 09/24/2019 3:00 PM Medical Record [id number] Patient Account Number: [id number] Date of Birth/Sex: Treating RN: 08/19/1950 (69 y.o. Matthew [name] Primary Care Provider: PATIENT, NO Other Clinician: Referring Provider: Treating Provider/Extender:Stasha Naraine, Lorenso Quarry, CAROLINE Weeks in Treatment: 4 Constitutional Sitting or standing Blood Pressure is within target range for patient.Marland Kitchen  Pulse regular and within target range for patient.Marland Kitchen. Respirations regular, non-labored and within target range.. Temperature is normal and within the target range for the patient.Marland Kitchen. Appears in no  distress. Respiratory work of breathing is normal. Few bibasilar crackles. Cardiovascular Heart rhythm and rate regular, without murmur or gallop. JVP is not elevated. Midsystolic murmur that sounds benign. Popliteal pulses are palpable on the left although reduced. Pedal pulses absent bilaterally.. Increasing edema in the right leg. Left is being controlled by our compression. Changes of chronic venous insufficiency. Notes Wound exam; long laceration wound in the mid tibia. He does not tolerate debridement even washing this off with wound cleanser and gauze. He has 2 small necrotic areas more towards the lower left leg. These are also washed off with wound care and gauze. On the right side he had a large blister which I unroofed. Surface wound on the foot. There is a lot of edema in the right leg although no signs of a DVT or cellulitis. Electronic Signature(s) Signed: 09/24/2019 5:34:04 PM By: Baltazar Najjarobson, Nabil Bubolz MD Entered By: Baltazar Najjarobson, Capone Schwinn on 09/24/2019 17:17:23 -------------------------------------------------------------------------------- Physician Orders Details Patient Name: Date of Service: Matthew MonteHAXTON, Bubba T. 09/24/2019 3:00 PM Medical Record [id number] Patient Account Number: [id number] Date of Birth/Sex: Treating RN: [birth year] (69 y.o. 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 Dressing  Change Frequency Do not change entire dressing for one week. - all wounds Skin Barriers/Peri-Wound Care Barrier cream Moisturizing lotion TCA Cream or Ointment - mix with lotion Wound Cleansing May shower with protection. - use cast protector Primary Wound Dressing Wound #1 Left,Distal,Anterior Lower Leg Hydrofera Blue - Classic Wound #2 Left,Lateral Ankle Hydrofera Blue - Classic Wound #3 Left,Distal,Lateral Lower Leg Hydrofera Blue - Classic Wound #4 Right,Dorsal Foot Calcium Alginate Secondary Dressing Wound #1 Left,Distal,Anterior Lower Leg Dry Gauze ABD pad Wound #2 Left,Lateral Ankle Dry Gauze ABD pad Wound #3 Left,Distal,Lateral Lower Leg Dry Gauze ABD pad Wound #4 Right,Dorsal Foot Dry Gauze Edema Control Kerlix and Coban - Bilateral - ****LIGHTLY WRAP**** Avoid standing for long periods of time Elevate legs to the level of the heart or above for 30 minutes daily and/or when sitting, a frequency of: - throughout the day Electronic Signature(s) Signed: 09/24/2019 5:34:04 PM By: Baltazar Najjarobson, Dominyk Law MD Signed: 09/24/2019 6:02:31 PM By: Zandra AbtsLynch, Shatara RN, BSN Entered By: Zandra AbtsLynch, Shatara on 09/24/2019 16:09:06 -------------------------------------------------------------------------------- Problem List Details Patient Name: Date of Service: Matthew MonteHAXTON, Bladimir T. 09/24/2019 3:00 PM Medical Record [id number] Patient Account Number: [id number] Date of Birth/Sex: Treating RN: [birth year] (69 y.o. Matthew KollerM) Lynch, Shatara Primary Care Provider: PATIENT, NO Other Clinician: Referring Provider: Treating Provider/Extender:Zeya Balles, Lorenso QuarryMichael ABERMAN, CAROLINE Weeks in Treatment: 4 Active Problems ICD-10 Evaluated Encounter Code Description Active Date Today Diagnosis I70.242 Atherosclerosis of native arteries of left leg with 08/27/2019 No Yes ulceration of calf I70.243 Atherosclerosis of native arteries of left leg with 08/27/2019 No Yes ulceration of ankle L97.828 Non-pressure  chronic ulcer of other part of left lower 08/27/2019 No Yes leg with other specified severity L97.322 Non-pressure chronic ulcer of left  ankle with fat layer 08/27/2019 No Yes exposed Inactive Problems Resolved Problems Electronic Signature(s) Signed: 09/24/2019 5:34:04 PM By: Baltazar Najjar MD Entered By: Baltazar Najjar on 09/24/2019 17:13:24 -------------------------------------------------------------------------------- Progress Note Details Patient Name: Date of Service: Matthew Monte T. 09/24/2019 3:00 PM Medical Record [id number] Patient Account Number: [id number] Date of Birth/Sex: Treating RN: 03-Aug-1950 (69 y.o. Matthew [name] Primary Care Provider: PATIENT, NO Other Clinician: Referring Provider: Treating Provider/Extender:Yailene Badia, Lorenso Quarry, CAROLINE Weeks in Treatment: 4 Subjective History of Present Illness (HPI) ADMISSION 08/27/2019 This is a 69 year old man who tells me that he fell going up stairs about a year ago resulting in a deep laceration type wound on the left anterior mid tibia. He was soon found to have significant PAD/critical limb ischemia. He had had previous noninvasive studies in 2017 showing an ABI of 0.82 on the right and 0.91 on the left with monophasic to biphasic waveforms. On 07/18/2019 his ABI in the right was 0.58 on the left 0.52 with TBI's of 0.51 on the right and 0 on the left. He underwent an angiogram by Dr. Randie Heinz on 3/1. He underwent bilateral iliac artery stents he was also discovered to have a left-sided SFA with it was patent up to 10 centimeters proximal to the adductor canal but then became stenotic with a 95% stenosis for 15 cm. Dominant runoff is via the posterior tibial artery. The patient still has the wound on the left anterior mid tibia. He has been using Vaseline to this. He wraps this and gauze and some form of Ace wrap. He complains bitterly of the pain also complains about falling with the leg giving out on him.  About 2 weeks ago he developed a new wound on the left lateral ankle. He had a DVT rule out study on 3/16/ 1 that was negative for a DVT Past medical history includes cervical spondylosis, left carotid bruit, marked PAD. ABI in our clinic was 0.71 on the left 3/29; patient admitted to the clinic last week with a laceration type injury on the left anterior mid tibia in the setting of severe PAD. He was supposed to follow-up with Dr. Randie Heinz last week but he tells me he had to cancel the appointment because of nausea and vomiting. I have encouraged him to rebook this ASAP. He tolerated our kerlix Coban wrap without incidence that the pain was better. We used Iodoflex to the wound area 4/5; working on getting him back in with Dr. Randie Heinz. We have been using Iodoflex under Curlex and Coban. He seems to be tolerating this 4/12; still complaining of a lot of pain. Wound itself on the left anterior mid tibia looks about the same perhaps somewhat better looking tissue. he has a new area on the left medial lower leg and ankle. He has previously had bilateral external iliac artery stents. Still complaining of a lot of pain 4/16; still complaining of a lot of pain. He has noninvasive studies on Friday morning and sees Dr. Randie Heinz after this. Large wound on the left anterior tibia. Smaller areas on the left medial lower leg and ankle. He arrives in clinic today with blisters on his dorsal right leg is swollen Objective Constitutional Sitting or standing Blood Pressure is within target range for patient.. Pulse regular and within target range for patient.Marland Kitchen Respirations regular, non-labored and within target range.. Temperature is normal and within the target range for the patient.Marland Kitchen Appears in no distress. Vitals Time Taken: 3:36 PM, Height: 72 in, Weight: 130 lbs, BMI: 17.6, Temperature:  97.9 F, Pulse: 76 bpm, Respiratory Rate: 18 breaths/min, Blood Pressure: 120/52 mmHg. Respiratory work of breathing is normal.  Few bibasilar crackles. Cardiovascular Heart rhythm and rate regular, without murmur or gallop. JVP is not elevated. Midsystolic murmur that sounds benign. Popliteal pulses are palpable on the left although reduced. Pedal pulses absent bilaterally.. Increasing edema in the right leg. Left is being controlled by our compression. Changes of chronic venous insufficiency. General Notes: Wound exam; long laceration wound in the mid tibia. He does not tolerate debridement even washing this off with wound cleanser and gauze. He has 2 small necrotic areas more towards the lower left leg. These are also washed off with wound care and gauze. ooOn the right side he had a large blister which I unroofed. Surface wound on the foot. There is a lot of edema in the right leg although no signs of a DVT or cellulitis. Integumentary (Hair, Skin) Wound #1 status is Open. Original cause of wound was Trauma. The wound is located on the [facility] Lower Leg. The wound measures 10.6cm length x 3.5cm width x 0.2cm depth; 29.138cm^2 area and 5.828cm^3 volume. There is Fat Layer (Subcutaneous Tissue) Exposed exposed. There is no tunneling or undermining noted. There is a medium amount of serosanguineous drainage noted. The wound margin is well defined and not attached to the wound base. There is small (1-33%) pink granulation within the wound bed. There is a large (67-100%) amount of necrotic tissue within the wound bed including Eschar and Adherent Slough. Wound #2 status is Open. Original cause of wound was Blister. The wound is located on the Left,Lateral Ankle. The wound measures 2cm length x 1cm width x 0.1cm depth; 1.571cm^2 area and 0.157cm^3 volume. There is no tunneling noted. There is a small amount of serosanguineous drainage noted. The wound margin is distinct with the outline attached to the wound base. There is no granulation within the wound bed. There is a large (67-100%) amount of necrotic tissue  within the wound bed including Eschar. Wound #3 status is Open. Original cause of wound was Gradually Appeared. The wound is located on the Left,Distal,Lateral Lower Leg. The wound measures 0.4cm length x 0.3cm width x 0.1cm depth; 0.094cm^2 area and 0.009cm^3 volume. There is Fat Layer (Subcutaneous Tissue) Exposed exposed. There is no tunneling or undermining noted. There is a medium amount of serosanguineous drainage noted. The wound margin is distinct with the outline attached to the wound base. There is large (67-100%) pink granulation within the wound bed. There is a small (1-33%) amount of necrotic tissue within the wound bed including Adherent Slough. Wound #4 status is Open. Original cause of wound was Blister. The wound is located on the Right,Dorsal Foot. The wound measures 2.5cm length x 2.5cm width x 0.1cm depth; 4.909cm^2 area and 0.491cm^3 volume. There is no tunneling or undermining noted. There is a medium amount of serous drainage noted. The wound margin is flat and intact. There is large (67-100%) pink, pale granulation within the wound bed. There is no necrotic tissue within the wound bed. 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 Dressing Change Frequency: Do not change entire dressing for one week. - all wounds Skin Barriers/Peri-Wound Care: Barrier cream  Moisturizing lotion TCA Cream or Ointment - mix with lotion Wound Cleansing: May shower with protection. - use cast protector Primary Wound Dressing: Wound #1 Left,Distal,Anterior Lower Leg: Hydrofera Blue - Classic Wound #2 Left,Lateral Ankle: Hydrofera Blue -  Classic Wound #3 Left,Distal,Lateral Lower Leg: Hydrofera Blue - Classic Wound #4 Right,Dorsal Foot: Calcium Alginate Secondary Dressing: Wound #1 Left,Distal,Anterior Lower Leg: Dry Gauze ABD pad Wound #2 Left,Lateral Ankle: Dry Gauze ABD pad Wound #3 Left,Distal,Lateral Lower Leg: Dry Gauze ABD pad Wound #4 Right,Dorsal Foot: Dry Gauze Edema Control: Kerlix and Coban - Bilateral - ****LIGHTLY WRAP**** Avoid standing for long periods of time Elevate legs to the level of the heart or above for 30 minutes daily and/or when sitting, a frequency of: - throughout the day 1. I change the primary dressing to [facility]. I wondered whether Iodoflex could be contributing to his pain in the left anterior lower leg. 2. He has no clear signs of congestive heart failure I was unclear about the increase in edema in the right leg. We put calcium alginate on the area on the dorsal foot and wrapped him in kerlix and Coban. We are also using kerlix and Coban on the left leg. 3. Finally were able to get a thorough vascular review by Dr. Randie Heinz on Friday. I think this will include noninvasive studies and review by the physician. Hopefully there can be an option for further revascularization Electronic Signature(s) Signed: 09/24/2019 5:34:04 PM By: Baltazar Najjar MD Entered By: Baltazar Najjar on 09/24/2019 17:19:10 -------------------------------------------------------------------------------- SuperBill Details Patient Name: Date of Service: Matthew [name] 09/24/2019 Medical Record [id number] Patient Account Number: [id number] Date of Birth/Sex: Treating RN: 1951/04/19 (70 y.o. 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 L97.828 Non-pressure chronic ulcer of other part of left lower leg with other specified severity L97.322 Non-pressure chronic ulcer of left ankle with fat layer exposed Facility Procedures CPT4 Code: 36629476 Description: 54650 - WOUND CARE VISIT-LEV 5 EST PT Modifier: Quantity: 1 Physician Procedures CPT4 Code Description: [id number] - WC PHYS LEVEL 3 - EST PT ICD-10 Diagnosis Description I70.242 Atherosclerosis of native arteries of left leg with ulcer I70.243 Atherosclerosis of native arteries of left leg with ulcer L97.828 Non-pressure chronic  ulcer of other part of left lower le severity L97.322 Non-pressure chronic ulcer of left ankle with fat layer e Modifier: ation of calf ation of ankle g with other speci xposed Quantity: 1 fied Electronic Signature(s) Signed: 09/24/2019 6:02:31 PM By: Zandra Abts RN, BSN Signed: 09/25/2019 5:47:38 PM By: Baltazar Najjar MD Previous Signature: 09/24/2019 5:34:04 PM Version By: Baltazar Najjar MD Entered By: Zandra Abts on 09/24/2019 17:58:27

## 2019-09-27 ENCOUNTER — Telehealth (HOSPITAL_COMMUNITY): Payer: Self-pay

## 2019-09-27 NOTE — Telephone Encounter (Signed)

## 2019-09-28 ENCOUNTER — Ambulatory Visit (INDEPENDENT_AMBULATORY_CARE_PROVIDER_SITE_OTHER)
Admission: RE | Admit: 2019-09-28 | Discharge: 2019-09-28 | Disposition: A | Discharge status: Home / Self Care | Payer: Medicare HMO | Source: Ambulatory Visit | Attending: Vascular Surgery | Admitting: Vascular Surgery

## 2019-09-28 ENCOUNTER — Ambulatory Visit (HOSPITAL_COMMUNITY)
Admission: RE | Admit: 2019-09-28 | Discharge: 2019-09-28 | Disposition: A | Discharge status: Home / Self Care | Payer: Medicare HMO | Source: Ambulatory Visit | Attending: Vascular Surgery | Admitting: Vascular Surgery

## 2019-09-28 ENCOUNTER — Other Ambulatory Visit: Payer: Self-pay

## 2019-09-28 ENCOUNTER — Ambulatory Visit (INDEPENDENT_AMBULATORY_CARE_PROVIDER_SITE_OTHER): Payer: Medicare HMO | Admitting: Physician Assistant

## 2019-09-28 ENCOUNTER — Encounter (HOSPITAL_COMMUNITY): Payer: Self-pay

## 2019-09-28 VITALS — BP 104/72 | HR 92 | Resp 16 | Ht 71.0 in | Wt 135.6 lb

## 2019-09-28 DIAGNOSIS — L97929 Non-pressure chronic ulcer of unspecified part of left lower leg with unspecified severity: Secondary | ICD-10-CM | POA: Diagnosis not present

## 2019-09-28 DIAGNOSIS — I739 Peripheral vascular disease, unspecified: Secondary | ICD-10-CM

## 2019-09-28 DIAGNOSIS — R6889 Other general symptoms and signs: Secondary | ICD-10-CM | POA: Diagnosis not present

## 2019-09-28 DIAGNOSIS — L97909 Non-pressure chronic ulcer of unspecified part of unspecified lower leg with unspecified severity: Secondary | ICD-10-CM

## 2019-09-28 HISTORY — DX: Non-pressure chronic ulcer of unspecified part of unspecified lower leg with unspecified severity: L97.909

## 2019-09-28 NOTE — Progress Notes (Signed)
Established Patient   History of Present Illness   Matthew [name] is a 69 y.o. (1950/08/22) male who presents status post bilateral common iliac artery stent grafting with a VBX and left SFA stent grafting with [place] by Dr. Donzetta Matters on 08/06/2019.  He has been in [facility] long wound care for extensive left lower extremity wound for which he is in [employer].  Patient states he has been dealing with this wound for over 6 months and has only seen minimal improvement.  Patient states current Unna boots were just applied 2 days ago and does not want to have them removed.  He is taking his aspirin, Plavix, and statin daily.  He is currently in the process of changing primary care providers.  He has had unintentional weight loss of 20 pounds due to lack of appetite over the past several months.  He denies any postprandial pain.  He is a former smoker who quit 10 years ago however states he smoked tobacco most of his life.  Also noted on aortogram was a 4 cm abdominal aortic aneurysm which she denies any new or changing abdominal or back pain.   The patient's PMH, PSH, SH, and FamHx were reviewed and are unchanged from prior visit.  Current Outpatient Medications  Medication Sig Dispense Refill  . aspirin EC 81 MG EC tablet Take 1 tablet (81 mg total) by mouth daily.    . clopidogrel (PLAVIX) 75 MG tablet Take 1 tablet (75 mg total) by mouth daily. 30 tablet 11  . diazepam (VALIUM) 5 MG tablet TAKE 1 TABLET BY MOUTH EVERY NIGHT AT BEDTIME AS NEEDED FOR MUSCLE SPASMS (Patient taking differently: Take 5 mg by mouth at bedtime. NEEDED FOR MUSCLE SPASMS) 90 tablet 1  . pregabalin (LYRICA) 100 MG capsule Take 1 capsule (100 mg total) by mouth daily. (Patient taking differently: Take 100 mg by mouth at bedtime. ) 90 capsule 1  . rosuvastatin (CRESTOR) 10 MG tablet Take 1 tablet (10 mg total) by mouth at bedtime. 30 tablet 11   No current facility-administered medications for this visit.    REVIEW OF SYSTEMS  (negative unless checked):   Cardiac:  []  Chest pain or chest pressure? []  Shortness of breath upon activity? []  Shortness of breath when lying flat? []  Irregular heart rhythm?  Vascular:  []  Pain in calf, thigh, or hip brought on by walking? []  Pain in feet at night that wakes you up from your sleep? []  Blood clot in your veins? []  Leg swelling?  Pulmonary:  []  Oxygen at home? []  Productive cough? []  Wheezing?  Neurologic:  []  Sudden weakness in arms or legs? []  Sudden numbness in arms or legs? []  Sudden onset of difficult speaking or slurred speech? []  Temporary loss of vision in one eye? []  Problems with dizziness?  Gastrointestinal:  []  Blood in stool? []  Vomited blood?  Genitourinary:  []  Burning when urinating? []  Blood in urine?  Psychiatric:  []  Major depression  Hematologic:  []  Bleeding problems? []  Problems with blood clotting?  Dermatologic:  []  Rashes or ulcers?  Constitutional:  []  Fever or chills?  Ear/Nose/Throat:  []  Change in hearing? []  Nose bleeds? []  Sore throat?  Musculoskeletal:  []  Back pain? []  Joint pain? []  Muscle pain?   Physical Examination   Vitals:   09/28/19 0845  BP: 104/72  Pulse: 92  Resp: 16  SpO2: 96%  Weight: 135 lb 9.6 oz (61.5 kg)  Height: 5\' 11"  (1.803 m)   Body  mass index is 18.91 kg/m.  General:  WDWN in NAD; vital signs documented above Gait: Not observed HENT: WNL, normocephalic Pulmonary: normal non-labored breathing , without Rales, rhonchi,  wheezing Cardiac: regular  Abdomen: soft, NT, no masses Skin: without rashes Vascular Exam/Pulses: Unna boots left in place bilaterally; good capillary refill in toes of both feet Extremities: Unna boots left in place Musculoskeletal: no muscle wasting or atrophy  Neurologic: A&O X 3;  No focal weakness or paresthesias are detected Psychiatric:  The pt has Normal affect.  Non-Invasive Vascular Imaging    Aortoiliac duplex demonstrates patent  common iliac artery stents bilaterally  Left lower extremity arterial duplex demonstrates a patent left SFA stent  ABIs not performed due to wounds   Medical Decision Making   Matthew [name] is a 69 y.o. male who presents status post aortogram with bilateral common iliac artery stenting and left SFA stent   Patent bilateral common iliac artery stents and left SFA stent based on duplex  Examination of wounds was deferred as he just had Unna boots applied several days ago and did not want to remove them today  Also noted on aortogram was a 4 cm abdominal aortic aneurysm which we will follow up with and 6 months  Continue current wound care with [facility] wound [facility]; also encouraged nutrition  Check aortoiliac duplex and left lower extremity arterial duplex in 6 months  Continue aspirin, Plavix, statin  Return sooner if wounds worsen  Patient was also provided a list of primary care providers close to his residence   Emilie Rutter PA-C [facility] Office: [phone]  Clinic MD: Randie Heinz

## 2019-10-01 ENCOUNTER — Encounter (HOSPITAL_BASED_OUTPATIENT_CLINIC_OR_DEPARTMENT_OTHER): Payer: Medicare HMO | Admitting: Internal Medicine

## 2019-10-01 ENCOUNTER — Other Ambulatory Visit: Payer: Self-pay

## 2019-10-01 DIAGNOSIS — L97828 Non-pressure chronic ulcer of other part of left lower leg with other specified severity: Secondary | ICD-10-CM | POA: Diagnosis not present

## 2019-10-01 DIAGNOSIS — R6889 Other general symptoms and signs: Secondary | ICD-10-CM | POA: Diagnosis not present

## 2019-10-01 DIAGNOSIS — I70242 Atherosclerosis of native arteries of left leg with ulceration of calf: Secondary | ICD-10-CM | POA: Diagnosis not present

## 2019-10-01 DIAGNOSIS — L97322 Non-pressure chronic ulcer of left ankle with fat layer exposed: Secondary | ICD-10-CM | POA: Diagnosis not present

## 2019-10-01 DIAGNOSIS — I70243 Atherosclerosis of native arteries of left leg with ulceration of ankle: Secondary | ICD-10-CM | POA: Diagnosis not present

## 2019-10-01 DIAGNOSIS — L97822 Non-pressure chronic ulcer of other part of left lower leg with fat layer exposed: Secondary | ICD-10-CM | POA: Diagnosis not present

## 2019-10-01 DIAGNOSIS — I70248 Atherosclerosis of native arteries of left leg with ulceration of other part of lower left leg: Secondary | ICD-10-CM | POA: Diagnosis not present

## 2019-10-01 NOTE — Progress Notes (Signed)
TANNON, PEERSON ([id number]) Visit Report for 10/01/2019 HPI Details Patient Name: Date of Service: GIBSON, LAD 10/01/2019 3:45 PM Medical Record [id number] Patient Account Number: [id number] Date of Birth/Sex: Treating RN: 09-05-1950 (69 y.o. [name] Primary Care Provider: PATIENT, NO Other Clinician: Referring Provider: Treating Provider/Extender:Moesha Sarchet, Pablo Ledger, CAROLINE Weeks in Treatment: 5 History of Present Illness HPI Description: ADMISSION 08/27/2019 This is a 69 year old man who tells me that he fell going up stairs about a year ago resulting in a deep laceration type wound on the left anterior mid tibia. He was soon found to have significant PAD/critical limb ischemia. He had had previous noninvasive studies in 2017 showing an ABI of 0.82 on the right and 0.91 on the left with monophasic to biphasic waveforms. On 07/18/2019 his ABI in the right was 0.58 on the left 0.52 with TBI's of 0.51 on the right and 0 on the left. He underwent an angiogram by Dr. Donzetta Matters on 3/1. He underwent bilateral iliac artery stents he was also discovered to have a left-sided SFA with it was patent up to 10 centimeters proximal to the adductor canal but then became stenotic with a 95% stenosis for 15 cm. Dominant runoff is via the posterior tibial artery. The patient still has the wound on the left anterior mid tibia. He has been using Vaseline to this. He wraps this and gauze and some form of Ace wrap. He complains bitterly of the pain also complains about falling with the leg giving out on him. About 2 weeks ago he developed a new wound on the left lateral ankle. He had a DVT rule out study on 3/16/ 1 that was negative for a DVT Past medical history includes cervical spondylosis, left carotid bruit, marked PAD. ABI in our clinic was 0.71 on the left 3/29; patient admitted to the clinic last week with a laceration type injury on the left anterior mid tibia in the setting  of severe PAD. He was supposed to follow-up with Dr. Donzetta Matters last week but he tells me he had to cancel the appointment because of nausea and vomiting. I have encouraged him to rebook this ASAP. He tolerated our kerlix Coban wrap without incidence that the pain was better. We used Iodoflex to the wound area 4/5; working on getting him back in with Dr. Donzetta Matters. We have been using Iodoflex under Curlex and Coban. He seems to be tolerating this 4/12; still complaining of a lot of pain. Wound itself on the left anterior mid tibia looks about the same perhaps somewhat better looking tissue. he has a new area on the left medial lower leg and ankle. He has previously had bilateral external iliac artery stents. Still complaining of a lot of pain 4/16; still complaining of a lot of pain. He has noninvasive studies on Friday morning and sees Dr. Donzetta Matters after this. Large wound on the left anterior tibia. Smaller areas on the left lateral lower leg and ankle. He arrives in clinic today with blisters on his dorsal right leg is swollen 4/26; still complaining of a lot of pain I changed him to [facility] from Iodoflex last week but that does not seem to have helped. He has smaller areas on the left lateral lower leg and ankle which also look ischemic. We sent him to see Dr. Donzetta Matters unfortunately his kerlix and Coban wrap was not removed. They were able to document that his bilateral common iliac artery stents and left SFA stents were patent based on duplex. However  they did not look at the tibial vessels toe brachial pressures etc. I am not sure whether his original angiograms looked at these in any detail. Electronic Signature(s) Signed: 10/01/2019 5:40:04 PM By: Baltazar Najjar MD Entered By: Baltazar Najjar on 10/01/2019 17:21:57 -------------------------------------------------------------------------------- Physical Exam Details Patient Name: Date of Service: [name] 10/01/2019 3:45 PM Medical  Record [id number] Patient Account Number: [id number] Date of Birth/Sex: Treating RN: Feb 23, 1951 (69 y.o. [name] Primary Care Provider: PATIENT, NO Other Clinician: Referring Provider: Treating Provider/Extender:Karsin Pesta, Lorenso Quarry, CAROLINE Weeks in Treatment: 5 Respiratory work of breathing is normal. Cardiovascular Pedal pulses are not palpable on the left. His toes are ischemic. Notes Wound exam; laceration in the mid tibia. The top part of this is covered and necrotic surface. Exceptionally painful. He also has 2 small areas on the lateral left lower leg towards the ankle which also look ischemic. On the right side the blisters looked better on the dorsal foot. We are using silver alginate Electronic Signature(s) Signed: 10/01/2019 5:40:04 PM By: Baltazar Najjar MD Entered By: Baltazar Najjar on 10/01/2019 17:23:25 -------------------------------------------------------------------------------- Physician Orders Details Patient Name: Date of Service: [name] 10/01/2019 3:45 PM Medical Record [id number] Patient Account Number: [id number] Date of Birth/Sex: Treating RN: 17-Aug-1950 (68 y.o. 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 Dressing Change Frequency Do not change entire dressing for one week. - all wounds Skin Barriers/Peri-Wound Care Barrier  cream Moisturizing lotion TCA Cream or Ointment - mix with lotion Wound Cleansing May shower with protection. - use cast protector Primary Wound Dressing Wound #1 Left,Distal,Anterior Lower Leg Medihoney Alginate Wound #2 Left,Lateral Ankle Medihoney Alginate Wound #3 Left,Dorsal Foot Medihoney Alginate Wound #4 Right,Dorsal Foot Calcium Alginate Secondary Dressing Wound #1 Left,Distal,Anterior Lower Leg Dry Gauze ABD pad Wound #2 Left,Lateral Ankle Dry Gauze ABD pad Wound #3 Left,Dorsal Foot Dry Gauze ABD pad Wound #4 Right,Dorsal Foot Dry Gauze Edema Control Kerlix and Coban - Bilateral - ****LIGHTLY WRAP**** Avoid standing for long periods of time Elevate legs to the level of the heart or above for 30 minutes daily and/or when sitting, a frequency of: - throughout the day Electronic Signature(s) Signed: 10/01/2019 5:33:33 PM By: Zandra Abts RN, BSN Signed: 10/01/2019 5:40:04 PM By: Baltazar Najjar MD Entered By: Zandra Abts on 10/01/2019 16:53:47 -------------------------------------------------------------------------------- Problem List Details Patient Name: Date of Service: [name] 10/01/2019 3:45 PM Medical Record [id number] Patient Account Number: [id number] Date of Birth/Sex: Treating RN: [birth year] (69 y.o. [name] Primary Care Provider: PATIENT, NO Other Clinician: Referring Provider: Treating Provider/Extender:Audon Heymann, Lorenso Quarry, CAROLINE Weeks in Treatment: 5 Active Problems ICD-10 Encounter Code Description Active Date MDM Diagnosis I70.242 Atherosclerosis of native arteries of left leg with 08/27/2019 No Yes ulceration of calf I70.243 Atherosclerosis of native arteries of left leg with 08/27/2019 No Yes ulceration of ankle L97.828 Non-pressure chronic ulcer of other part of left lower leg 08/27/2019 No Yes with other specified severity L97.322 Non-pressure chronic ulcer of left ankle with fat layer 08/27/2019 No  Yes exposed Inactive Problems  Resolved Problems Electronic Signature(s) Signed: 10/01/2019 5:40:04 PM By: Baltazar Najjar MD Entered By: Baltazar Najjar on 10/01/2019 17:19:41 -------------------------------------------------------------------------------- Progress Note Details Patient Name: Date of Service: [name] 10/01/2019 3:45 PM Medical Record [id number] Patient Account Number: [id number] Date of Birth/Sex: Treating RN: 19-Jan-1951 (69 y.o. [name] Primary Care Provider: PATIENT, NO Other Clinician: Referring Provider: Treating Provider/Extender:Keigan Tafoya, Lorenso Quarry, CAROLINE Weeks in Treatment: 5 Subjective History of Present Illness (HPI) ADMISSION 08/27/2019 This is a 69 year old man who tells me that he fell going up stairs about a year ago resulting in a deep laceration type wound on the left anterior mid tibia. He was soon found to have significant PAD/critical limb ischemia. He had had previous noninvasive studies in 2017 showing an ABI of 0.82 on the right and 0.91 on the left with monophasic to biphasic waveforms. On 07/18/2019 his ABI in the right was 0.58 on the left 0.52 with TBI's of 0.51 on the right and 0 on the left. He underwent an angiogram by Dr. Randie Heinz on 3/1. He underwent bilateral iliac artery stents he was also discovered to have a left-sided SFA with it was patent up to 10 centimeters proximal to the adductor canal but then became stenotic with a 95% stenosis for 15 cm. Dominant runoff is via the posterior tibial artery. The patient still has the wound on the left anterior mid tibia. He has been using Vaseline to this. He wraps this and gauze and some form of Ace wrap. He complains bitterly of the pain also complains about falling with the leg giving out on him. About 2 weeks ago he developed a new wound on the left lateral ankle. He had a DVT rule out study on 3/16/ 1 that was negative for a DVT Past medical history includes  cervical spondylosis, left carotid bruit, marked PAD. ABI in our clinic was 0.71 on the left 3/29; patient admitted to the clinic last week with a laceration type injury on the left anterior mid tibia in the setting of severe PAD. He was supposed to follow-up with Dr. Randie Heinz last week but he tells me he had to cancel the appointment because of nausea and vomiting. I have encouraged him to rebook this ASAP. He tolerated our kerlix Coban wrap without incidence that the pain was better. We used Iodoflex to the wound area 4/5; working on getting him back in with Dr. Randie Heinz. We have been using Iodoflex under Curlex and Coban. He seems to be tolerating this 4/12; still complaining of a lot of pain. Wound itself on the left anterior mid tibia looks about the same perhaps somewhat better looking tissue. he has a new area on the left medial lower leg and ankle. He has previously had bilateral external iliac artery stents. Still complaining of a lot of pain 4/16; still complaining of a lot of pain. He has noninvasive studies on Friday morning and sees Dr. Randie Heinz after this. Large wound on the left anterior tibia. Smaller areas on the left lateral lower leg and ankle. He arrives in clinic today with blisters on his dorsal right leg is swollen 4/26; still complaining of a lot of pain I changed him to [facility] from Iodoflex last week but that does not seem to have helped. He has smaller areas on the left lateral lower leg and ankle which also look ischemic. We sent him to see Dr. Randie Heinz unfortunately his kerlix and Coban wrap was not removed. They were able to document that his bilateral common  iliac artery stents and left SFA stents were patent based on duplex. However they did not look at the tibial vessels toe brachial pressures etc. I am not sure whether his original angiograms looked at these in any detail. Objective Constitutional Vitals Time Taken: 3:33 PM, Height: 72 in, Weight: 130 lbs, BMI: 17.6,  Temperature: 97.5 F, Pulse: 70 bpm, Respiratory Rate: 18 breaths/min, Blood Pressure: 129/54 mmHg. Respiratory work of breathing is normal. Cardiovascular Pedal pulses are not palpable on the left. His toes are ischemic. General Notes: Wound exam; laceration in the mid tibia. The top part of this is covered and necrotic surface. Exceptionally painful. He also has 2 small areas on the lateral left lower leg towards the ankle which also look ischemic. ooOn the right side the blisters looked better on the dorsal foot. We are using silver alginate Integumentary (Hair, Skin) Wound #1 status is Open. Original cause of wound was Trauma. The wound is located on the [facility] Lower Leg. The wound measures 10.5cm length x 4.1cm width x 0.2cm depth; 33.811cm^2 area and 6.762cm^3 volume. There is Fat Layer (Subcutaneous Tissue) Exposed exposed. There is no tunneling or undermining noted. There is a medium amount of serosanguineous drainage noted. The wound margin is well defined and not attached to the wound base. There is small (1-33%) pink granulation within the wound bed. There is a large (67-100%) amount of necrotic tissue within the wound bed including Eschar and Adherent Slough. Wound #2 status is Open. Original cause of wound was Blister. The wound is located on the Left,Lateral Ankle. The wound measures 1.5cm length x 1.7cm width x 0.1cm depth; 2.003cm^2 area and 0.2cm^3 volume. There is no tunneling or undermining noted. There is a small amount of serosanguineous drainage noted. The wound margin is distinct with the outline attached to the wound base. There is no granulation within the wound bed. There is a large (67-100%) amount of necrotic tissue within the wound bed including Eschar and Adherent Slough. Wound #3 status is Open. Original cause of wound was Gradually Appeared. The wound is located on the Left,Dorsal Foot. The wound measures 0.3cm length x 0.3cm width x 0.1cm depth;  0.071cm^2 area and 0.007cm^3 volume. There is Fat Layer (Subcutaneous Tissue) Exposed exposed. There is no tunneling or undermining noted. There is a none present amount of drainage noted. The wound margin is distinct with the outline attached to the wound base. There is no granulation within the wound bed. There is a large (67-100%) amount of necrotic tissue within the wound bed including Adherent Slough. Wound #4 status is Open. Original cause of wound was Blister. The wound is located on the Right,Dorsal Foot. The wound measures 2.5cm length x 2.6cm width x 0.1cm depth; 5.105cm^2 area and 0.511cm^3 volume. There is no tunneling or undermining noted. There is a medium amount of serous drainage noted. The wound margin is flat and intact. There is large (67-100%) pink, pale granulation within the wound bed. There is no necrotic tissue within the wound bed. 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 Dressing Change Frequency: Do not change entire dressing for one week. - all wounds Skin Barriers/Peri-Wound Care: Barrier cream Moisturizing lotion TCA Cream or Ointment - mix  with lotion Wound Cleansing: May shower with protection. - use cast protector Primary Wound Dressing: Wound #1 Left,Distal,Anterior Lower Leg: Medihoney Alginate Wound #2 Left,Lateral Ankle: Medihoney Alginate Wound #3 Left,Dorsal Foot: Medihoney Alginate Wound #4 Right,Dorsal Foot: Calcium Alginate Secondary Dressing: Wound #1 Left,Distal,Anterior Lower Leg: Dry Gauze ABD pad Wound #2 Left,Lateral Ankle: Dry Gauze ABD pad Wound #3 Left,Dorsal Foot: Dry Gauze ABD  pad Wound #4 Right,Dorsal Foot: Dry Gauze Edema Control: Kerlix and Coban - Bilateral - ****LIGHTLY WRAP**** Avoid standing for long periods of time Elevate legs to the level of the heart or above for 30 minutes daily and/or when sitting, a frequency of: - throughout the day 1. Unfortunately we were not able to get the patient to take his wrap off instead of our best efforts. Therefore we have no useful information in the tibial vessels. I suspect the wounds in his anterior and lateral left lower leg are ischemic and his pain is related to claudication. His toes are cyanotic at the tips. 2. We change the dressing to Medihoney and calcium alginate to see if we could debride some of the eschar. We are doing that on all the wounds on the left 3. Continue with silver alginate on the dorsal right foot 4. I will look back at the angiograms he had on the left to see if there was any imaging of the tibial vessels . Based on this I will again reach out to vascular surgery. I do not know that I have any choice here. Electronic Signature(s) Signed: 10/01/2019 5:40:04 PM By: Baltazar Najjarobson, Janashia Parco MD Entered By: Baltazar Najjarobson, Mayrani Khamis on 10/01/2019 17:24:55 -------------------------------------------------------------------------------- SuperBill Details Patient Name: Date of Service: Marin ShutterHAXTON, Adriene T. 10/01/2019 Medical Record [id number] Patient Account Number: [id number] Date of Birth/Sex: Treating RN: 24-Jul-1950 (69 y.o. 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 I70.243 Atherosclerosis of native arteries of left leg with ulceration of ankle L97.828 Non-pressure chronic ulcer of other part of left lower leg with other specified severity L97.322  Non-pressure chronic ulcer of left ankle with fat layer exposed Facility Procedures CPT4 Code: [id number] Description: [id number] - WOUND CARE VISIT-LEV 5 EST PT Modifier: Quantity: 1 Physician Procedures CPT4 Code Description: [id number] 99214 - WC PHYS LEVEL 4 - EST PT ICD-10 Diagnosis Description L97.828 Non-pressure chronic ulcer of other part of left lower leg wit I70.242 Atherosclerosis of native arteries of left leg with ulceration L97.322  Non-pressure chronic ulcer of left ankle with fat layer expose Modifier: h other specifi of calf d Quantity: 1 ed severity Electronic Signature(s) Signed: 10/01/2019 5:40:04 PM By: Baltazar Najjarobson, Jarryd Gratz MD Entered By: Baltazar Najjarobson, Shailynn Fong on 10/01/2019 17:26:48

## 2019-10-02 ENCOUNTER — Other Ambulatory Visit: Payer: Self-pay | Admitting: *Deleted

## 2019-10-02 DIAGNOSIS — I739 Peripheral vascular disease, unspecified: Secondary | ICD-10-CM

## 2019-10-08 ENCOUNTER — Encounter (HOSPITAL_BASED_OUTPATIENT_CLINIC_OR_DEPARTMENT_OTHER): Payer: Medicare HMO | Attending: Internal Medicine | Admitting: Internal Medicine

## 2019-10-08 DIAGNOSIS — L97512 Non-pressure chronic ulcer of other part of right foot with fat layer exposed: Secondary | ICD-10-CM | POA: Diagnosis not present

## 2019-10-08 DIAGNOSIS — I70243 Atherosclerosis of native arteries of left leg with ulceration of ankle: Secondary | ICD-10-CM | POA: Insufficient documentation

## 2019-10-08 DIAGNOSIS — L97322 Non-pressure chronic ulcer of left ankle with fat layer exposed: Secondary | ICD-10-CM | POA: Diagnosis not present

## 2019-10-08 DIAGNOSIS — I70242 Atherosclerosis of native arteries of left leg with ulceration of calf: Secondary | ICD-10-CM | POA: Insufficient documentation

## 2019-10-08 DIAGNOSIS — L97828 Non-pressure chronic ulcer of other part of left lower leg with other specified severity: Secondary | ICD-10-CM | POA: Insufficient documentation

## 2019-10-08 DIAGNOSIS — R6889 Other general symptoms and signs: Secondary | ICD-10-CM | POA: Diagnosis not present

## 2019-10-08 DIAGNOSIS — L97518 Non-pressure chronic ulcer of other part of right foot with other specified severity: Secondary | ICD-10-CM | POA: Diagnosis not present

## 2019-10-08 NOTE — Progress Notes (Signed)
KYON, BENTLER T ([id number]) . Visit Report for 10/08/2019 HPI Details Patient Name: Date of Service: THA Duard Larsen, [name] 10/08/2019 2:00 PM Medical Record Number: [id number] Patient Account Number: [id number] Date of Birth/Sex: Treating RN: Jun 04, 1951 (69 y.o. [name] Primary Care Provider: PA Haig Prophet, NO Other Clinician: Referring Provider: Treating Provider/Extender: Lehman Prom, [place] RO LINE Weeks in Treatment: 6 History of Present Illness HPI Description: ADMISSION 08/27/2019 This is a 69 year old man who tells me that he fell going up stairs about a year ago resulting in a deep laceration type wound on the left anterior mid tibia. He was soon found to have significant PAD/critical limb ischemia. He had had previous noninvasive studies in 2017 showing an ABI of 0.82 on the right and 0.91 on the left with monophasic to biphasic waveforms. On 07/18/2019 his ABI in the right was 0.58 on the left 0.52 with TBI's of 0.51 on the right and 0 on the left. He underwent an angiogram by Dr. Donzetta Matters on 3/1. He underwent bilateral iliac artery stents he was also discovered to have a left-sided SFA with it was patent up to 10 centimeters proximal to the adductor canal but then became stenotic with a 95% stenosis for 15 cm. Dominant runoff is via the posterior tibial artery. The patient still has the wound on the left anterior mid tibia. He has been using Vaseline to this. He wraps this and gauze and some form of Ace wrap. He complains bitterly of the pain also complains about falling with the leg giving out on him. About 2 weeks ago he developed a new wound on the left lateral ankle. He had a DVT rule out study on 3/16/ 1 that was negative for a DVT Past medical history includes cervical spondylosis, left carotid bruit, marked PAD. ABI in our clinic was 0.71 on the left 3/29; patient admitted to the clinic last week with a laceration type injury on the left anterior mid tibia in the  setting of severe PAD. He was supposed to follow- up with Dr. Donzetta Matters last week but he tells me he had to cancel the appointment because of nausea and vomiting. I have encouraged him to rebook this ASAP. He tolerated our kerlix Coban wrap without incidence that the pain was better. We used Iodoflex to the wound area 4/5; working on getting him back in with Dr. Donzetta Matters. We have been using Iodoflex under Curlex and Coban. He seems to be tolerating this 4/12; still complaining of a lot of pain. Wound itself on the left anterior mid tibia looks about the same perhaps somewhat better looking tissue. he has a new area on the left medial lower leg and ankle. He has previously had bilateral external iliac artery stents. Still complaining of a lot of pain 4/16; still complaining of a lot of pain. He has noninvasive studies on Friday morning and sees Dr. Donzetta Matters after this. Large wound on the left anterior tibia. Smaller areas on the left lateral lower leg and ankle. He arrives in clinic today with blisters on his dorsal right leg is swollen 4/26; still complaining of a lot of pain I changed him to [facility] from Iodoflex last week but that does not seem to have helped. He has smaller areas on the left lateral lower leg and ankle which also look ischemic. We sent him to see Dr. Donzetta Matters unfortunately his kerlix and Coban wrap was not removed. They were able to document that his bilateral common iliac  artery stents and left SFA stents were patent based on duplex. However they did not look at the tibial vessels toe brachial pressures etc. I am not sure whether his original angiograms looked at these in any detail. 5/3; the patient has a substantial wound on the left anterior mid tibia smaller wound on the left lateral ankle both of these very painful. He also has an area on the right dorsal foot. I did speak with Dr. Randie Heinzain last week they are trying to get him into the office to look at his arterial status especially in the  left lower leg. The stents previously placed in the iliacs bilaterally and the left SFA were patent. We are using Kandis MannanHydrofera Blue Electronic Signature(s) Signed: 10/08/2019 5:33:20 PM By: Baltazar Najjarobson, Liyat Faulkenberry MD Entered By: Baltazar Najjarobson, Tenya Araque on 10/08/2019 15:43:21 -------------------------------------------------------------------------------- Physical Exam Details Patient Name: Date of Service: THA Jodell CiproXTO N, [name] 10/08/2019 2:00 PM Medical Record Number: [id number] Patient Account Number: [id number] Date of Birth/Sex: Treating RN: 31-Mar-1951 [age] y.o. [name]) Lynch, Shatara Primary Care Provider: PA Zenovia JordanIENT, NO Other Clinician: Referring Provider: Treating Provider/Extender: Morrie Sheldonobson, Mckay Brandt A BERMA N, [place] RO LINE Weeks in Treatment: 6 Constitutional Sitting or standing Blood Pressure is within target range for patient.. Pulse regular and within target range for patient.Marland Kitchen. Respirations regular, non-labored and within target range.. Temperature is normal and within the target range for the patient.Marland Kitchen. Appears in no distress. Notes Wound exam; large area in the mid tibia. There is debris on the surface of this including some purulent area. He also has 2 areas on the left lateral lower leg x2 piece these do not have a completely viable surface. Right dorsal foot area that was a blister 2 weeks ago has eschared surface nonviable Electronic Signature(s) Signed: 10/08/2019 5:33:20 PM By: Baltazar Najjarobson, Suesan Mohrmann MD Entered By: Baltazar Najjarobson, Digby Groeneveld on 10/08/2019 15:44:14 -------------------------------------------------------------------------------- Physician Orders Details Patient Name: Date of Service: THA Jodell CiproXTO N, [name] 10/08/2019 2:00 PM Medical Record Number: [id number] Patient Account Number: [id number] Date of Birth/Sex: Treating RN: 31-Mar-1951 [age] y.o. 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 Dressing Change Frequency Do not change entire dressing for one week. - all wounds Skin Barriers/Peri-Wound Care Barrier cream Moisturizing lotion TCA Cream or Ointment - mix with lotion Wound Cleansing May shower with protection. - use cast protector Primary Wound Dressing Wound #1 Left,Distal,Anterior Lower Leg Medihoney Alginate Wound #2 Left,Lateral Ankle Medihoney Alginate Wound #3 Left,Dorsal Foot Medihoney Alginate Wound #4 Right,Dorsal Foot Calcium Alginate Secondary Dressing Wound #1 Left,Distal,Anterior Lower Leg Dry Gauze ABD pad Wound #2 Left,Lateral Ankle Dry Gauze ABD pad Wound #3 Left,Dorsal Foot Dry Gauze ABD pad Wound #4 Right,Dorsal Foot Dry Gauze Edema Control Kerlix and Coban - Bilateral - ****LIGHTLY WRAP**** Avoid standing for long periods of time Elevate legs to the level of the heart or above for 30 minutes daily and/or when sitting, a frequency of: - throughout the day Electronic Signature(s) Signed: 10/08/2019 5:27:06 PM By: Zandra AbtsLynch, Shatara RN, BSN Signed: 10/08/2019  5:33:20 PM By: Baltazar Najjar MD Entered By: Zandra Abts on 10/08/2019 14:56:07 -------------------------------------------------------------------------------- Problem List Details Patient Name: Date of Service: THA [name], [name] 10/08/2019 2:00 PM Medical Record Number: [id number] Patient Account Number: [id number] Date of Birth/Sex: Treating RN: 1950-09-24 (69 y.o. 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 08/27/2019 No Yes L97.828 Non-pressure chronic ulcer of other part of left lower leg with other specified 08/27/2019 No Yes severity L97.322 Non-pressure chronic ulcer of left ankle with fat layer exposed 08/27/2019 No Yes L97.518 Non-pressure chronic ulcer of other part of right foot with other specified 10/08/2019 No Yes severity Inactive Problems Resolved Problems Electronic Signature(s) Signed: 10/08/2019 5:33:20 PM By: Baltazar Najjar MD Entered By: Baltazar Najjar on 10/08/2019 15:41:48 -------------------------------------------------------------------------------- Progress Note Details Patient Name: Date of Service: THA [name], [name] 10/08/2019 2:00 PM Medical Record Number: [id number] Patient Account Number: [id number] Date of Birth/Sex: Treating RN: 11/08/1950 (69 y.o. [name] Primary Care Provider: PA Zenovia Jordan, NO Other Clinician: Referring Provider: Treating Provider/Extender: Morrie Sheldon, [place] RO LINE Weeks in Treatment: 6 Subjective History of Present Illness (HPI) ADMISSION 08/27/2019 This is a 69 year old man who tells me that he fell going up stairs about a year ago resulting in a deep laceration type wound on the left anterior mid tibia. He was soon found to have significant PAD/critical limb ischemia. He had had previous noninvasive studies in 2017 showing an ABI of 0.82 on the right and 0.91 on the left with monophasic to biphasic waveforms. On 07/18/2019 his ABI in the right was 0.58 on the left 0.52 with TBI's of 0.51 on the right and 0 on the left. He underwent  an angiogram by Dr. Randie Heinz on 3/1. He underwent bilateral iliac artery stents he was also discovered to have a left-sided SFA with it was patent up to 10 centimeters proximal to the adductor canal but then became stenotic with a 95% stenosis for 15 cm. Dominant runoff is via the posterior tibial artery. The patient still has the wound on the left anterior mid tibia. He has been using Vaseline to this. He wraps this and gauze and some form of Ace wrap. He complains bitterly of the pain also complains about falling with the leg giving out on him. About 2 weeks ago he developed a new wound on the left lateral ankle. He had a DVT rule out study on 3/16/ 1 that was negative for a DVT Past medical history includes cervical spondylosis, left carotid bruit, marked PAD. ABI in our clinic was 0.71 on the left 3/29; patient admitted to the clinic last week with a laceration type injury on the left anterior mid tibia in the setting of severe PAD. He was supposed to follow- up with Dr. Randie Heinz last week but he tells me he had to cancel the appointment because of nausea and vomiting. I have encouraged him to rebook this ASAP. He tolerated our kerlix Coban wrap without incidence that the pain was better. We used Iodoflex to the wound area 4/5; working on getting him back in with Dr. Randie Heinz. We have been using Iodoflex under Curlex and Coban. He seems  to be tolerating this 4/12; still complaining of a lot of pain. Wound itself on the left anterior mid tibia looks about the same perhaps somewhat better looking tissue. he has a new area on the left medial lower leg and ankle. He has previously had bilateral external iliac artery stents. Still complaining of a lot of pain 4/16; still complaining of a lot of pain. He has noninvasive studies on Friday morning and sees Dr. Randie Heinz after this. Large wound on the left anterior tibia. Smaller areas on the left lateral lower leg and ankle. He arrives in clinic today with blisters on his  dorsal right leg is swollen 4/26; still complaining of a lot of pain I changed him to [facility] from Iodoflex last week but that does not seem to have helped. He has smaller areas on the left lateral lower leg and ankle which also look ischemic. We sent him to see Dr. Randie Heinz unfortunately his kerlix and Coban wrap was not removed. They were able to document that his bilateral common iliac artery stents and left SFA stents were patent based on duplex. However they did not look at the tibial vessels toe brachial pressures etc. I am not sure whether his original angiograms looked at these in any detail. 5/3; the patient has a substantial wound on the left anterior mid tibia smaller wound on the left lateral ankle both of these very painful. He also has an area on the right dorsal foot. I did speak with Dr. Randie Heinz last week they are trying to get him into the office to look at his arterial status especially in the left lower leg. The stents previously placed in the iliacs bilaterally and the left SFA were patent. We are using Hydrofera Blue Objective Constitutional Sitting or standing Blood Pressure is within target range for patient.. Pulse regular and within target range for patient.Marland Kitchen Respirations regular, non-labored and within target range.. Temperature is normal and within the target range for the patient.Marland Kitchen Appears in no distress. Vitals Time Taken: 2:16 PM, Height: 72 in, Weight: 130 lbs, BMI: 17.6, Temperature: 97.8 F, Pulse: 74 bpm, Respiratory Rate: 18 breaths/min, Blood Pressure: 127/44 mmHg. General Notes: Wound exam; large area in the mid tibia. There is debris on the surface of this including some purulent area. He also has 2 areas on the left lateral lower leg x2 piece these do not have a completely viable surface. ooRight dorsal foot area that was a blister 2 weeks ago has eschared surface nonviable Integumentary (Hair, Skin) Wound #1 status is Open. Original cause of wound was  Trauma. The wound is located on the [facility] Lower Leg. The wound measures 10.6cm length x 4.3cm width x 0.1cm depth; 35.798cm^2 area and 3.58cm^3 volume. There is Fat Layer (Subcutaneous Tissue) Exposed exposed. There is no tunneling or undermining noted. There is a medium amount of serosanguineous drainage noted. The wound margin is well defined and not attached to the wound base. There is small (1-33%) pink granulation within the wound bed. There is a large (67-100%) amount of necrotic tissue within the wound bed including Eschar and Adherent Slough. Wound #2 status is Open. Original cause of wound was Blister. The wound is located on the Left,Lateral Ankle. The wound measures 1.8cm length x 1.6cm width x 0.1cm depth; 2.262cm^2 area and 0.226cm^3 volume. There is no tunneling or undermining noted. There is a small amount of serosanguineous drainage noted. The wound margin is distinct with the outline attached to the wound base. There is no  granulation within the wound bed. There is a large (67-100%) amount of necrotic tissue within the wound bed including Eschar and Adherent Slough. Wound #3 status is Open. Original cause of wound was Gradually Appeared. The wound is located on the Left,Dorsal Foot. The wound measures 0.4cm length x 0.4cm width x 0.1cm depth; 0.126cm^2 area and 0.013cm^3 volume. There is Fat Layer (Subcutaneous Tissue) Exposed exposed. There is no tunneling or undermining noted. There is a none present amount of drainage noted. The wound margin is distinct with the outline attached to the wound base. There is no granulation within the wound bed. There is a large (67-100%) amount of necrotic tissue within the wound bed including Adherent Slough. Wound #4 status is Open. Original cause of wound was Blister. The wound is located on the Right,Dorsal Foot. The wound measures 2.1cm length x 2.2cm width x 0.1cm depth; 3.629cm^2 area and 0.363cm^3 volume. There is no tunneling  or undermining noted. There is a medium amount of serous drainage noted. The wound margin is flat and intact. There is small (1-33%) pink granulation within the wound bed. There is a large (67-100%) amount of necrotic tissue within the wound bed including Adherent Slough. 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 Dressing Change Frequency: Do not change entire dressing for one week. - all wounds Skin Barriers/Peri-Wound Care: Barrier cream Moisturizing lotion TCA Cream or Ointment - mix with lotion Wound Cleansing: May shower with protection. - use cast protector Primary Wound Dressing: Wound #1 Left,Distal,Anterior Lower Leg: Medihoney Alginate Wound #2 Left,Lateral Ankle: Medihoney Alginate Wound #3 Left,Dorsal Foot: Medihoney Alginate Wound #4 Right,Dorsal Foot: Calcium Alginate Secondary Dressing: Wound #1 Left,Distal,Anterior Lower Leg: Dry Gauze ABD pad Wound #2 Left,Lateral Ankle: Dry Gauze ABD pad Wound #3 Left,Dorsal Foot: Dry Gauze ABD pad Wound #4 Right,Dorsal Foot: Dry Gauze Edema Control: Kerlix and Coban - Bilateral - ****LIGHTLY WRAP**** Avoid standing for long periods of time Elevate legs to the level of the heart or above for 30 minutes daily and/or when sitting, a frequency of: - throughout the day 1. We are using Medihoney and calcium alginate to the large area on the left anterior tibial area. This actually looks some better in terms of the surface of the wound but not much improvement in surface area we are also using this  to the ankle 2. The patient is complaining of a lot of pain. I am hopeful Dr. Randie Heinz can get him in for either further noninvasive testing of the flow in the left leg or potentially another angiogram Electronic Signature(s) Signed: 10/08/2019 5:33:20 PM By: Baltazar Najjar MD Entered By: Baltazar Najjar on 10/08/2019 15:45:40 -------------------------------------------------------------------------------- SuperBill Details Patient Name: Date of Service: THA [name], [name] 10/08/2019 Medical Record Number: [id number] Patient Account Number: [id number] Date of Birth/Sex: Treating RN: Oct 05, 1950 (69 y.o. 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 I70.243 Atherosclerosis of native arteries of left leg  with ulceration of ankle L97.828 Non-pressure chronic ulcer of other part of left lower leg with other specified severity L97.322 Non-pressure chronic ulcer of left ankle with fat layer exposed Facility Procedures CPT4 Code: [id number] Description: [phone] - WOUND CARE VISIT-LEV 5 EST PT Modifier: Quantity: 1 Physician Procedures : CPT4 Code Description Modifier [id number] - WC PHYS LEVEL 3 - EST PT ICD-10 Diagnosis Description L97.828 Non-pressure chronic ulcer of other part of left lower leg with other specified severity L97.322 Non-pressure chronic ulcer of left ankle with  fat layer exposed I70.242 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

## 2019-10-15 ENCOUNTER — Encounter (HOSPITAL_BASED_OUTPATIENT_CLINIC_OR_DEPARTMENT_OTHER): Payer: Medicare HMO | Admitting: Internal Medicine

## 2019-10-15 DIAGNOSIS — L97322 Non-pressure chronic ulcer of left ankle with fat layer exposed: Secondary | ICD-10-CM | POA: Diagnosis not present

## 2019-10-15 DIAGNOSIS — L97828 Non-pressure chronic ulcer of other part of left lower leg with other specified severity: Secondary | ICD-10-CM | POA: Diagnosis not present

## 2019-10-15 DIAGNOSIS — L97512 Non-pressure chronic ulcer of other part of right foot with fat layer exposed: Secondary | ICD-10-CM | POA: Diagnosis not present

## 2019-10-15 DIAGNOSIS — I70243 Atherosclerosis of native arteries of left leg with ulceration of ankle: Secondary | ICD-10-CM | POA: Diagnosis not present

## 2019-10-15 DIAGNOSIS — I70242 Atherosclerosis of native arteries of left leg with ulceration of calf: Secondary | ICD-10-CM | POA: Diagnosis not present

## 2019-10-15 DIAGNOSIS — R6889 Other general symptoms and signs: Secondary | ICD-10-CM | POA: Diagnosis not present

## 2019-10-16 NOTE — Progress Notes (Signed)
LARSON, LIMONES T ([id number]) . Visit Report for 10/15/2019 HPI Details Patient Name: Date of Service: [name] [facility] T. 10/15/2019 1:45 PM Medical Record Number: [id number] Patient Account Number: [id number] Date of Birth/Sex: Treating RN: March 13, 1951 (69 y.o. [name] Primary Care Provider: PA Zenovia Jordan, NO Other Clinician: Referring Provider: Treating Provider/Extender: Morrie Sheldon, [place] RO LINE Weeks in Treatment: 7 History of Present Illness HPI Description: ADMISSION 08/27/2019 This is a 69 year old man who tells me that he fell going up stairs about a year ago resulting in a deep laceration type wound on the left anterior mid tibia. He was soon found to have significant PAD/critical limb ischemia. He had had previous noninvasive studies in 2017 showing an ABI of 0.82 on the right and 0.91 on the left with monophasic to biphasic waveforms. On 07/18/2019 his ABI in the right was 0.58 on the left 0.52 with TBI's of 0.51 on the right and 0 on the left. He underwent an angiogram by Dr. Randie Heinz on 3/1. He underwent bilateral iliac artery stents he was also discovered to have a left-sided SFA with it was patent up to 10 centimeters proximal to the adductor canal but then became stenotic with a 95% stenosis for 15 cm. Dominant runoff is via the posterior tibial artery. The patient still has the wound on the left anterior mid tibia. He has been using Vaseline to this. He wraps this and gauze and some form of Ace wrap. He complains bitterly of the pain also complains about falling with the leg giving out on him. About 2 weeks ago he developed a new wound on the left lateral ankle. He had a DVT rule out study on 3/16/ 1 that was negative for a DVT Past medical history includes cervical spondylosis, left carotid bruit, marked PAD. ABI in our clinic was 0.71 on the left 3/29; patient admitted to the clinic last week with a laceration type injury on the left anterior mid tibia in the  setting of severe PAD. He was supposed to follow- up with Dr. Randie Heinz last week but he tells me he had to cancel the appointment because of nausea and vomiting. I have encouraged him to rebook this ASAP. He tolerated our kerlix Coban wrap without incidence that the pain was better. We used Iodoflex to the wound area 4/5; working on getting him back in with Dr. Randie Heinz. We have been using Iodoflex under Curlex and Coban. He seems to be tolerating this 4/12; still complaining of a lot of pain. Wound itself on the left anterior mid tibia looks about the same perhaps somewhat better looking tissue. he has a new area on the left medial lower leg and ankle. He has previously had bilateral external iliac artery stents. Still complaining of a lot of pain 4/16; still complaining of a lot of pain. He has noninvasive studies on Friday morning and sees Dr. Randie Heinz after this. Large wound on the left anterior tibia. Smaller areas on the left lateral lower leg and ankle. He arrives in clinic today with blisters on his dorsal right leg is swollen 4/26; still complaining of a lot of pain I changed him to [facility] from Iodoflex last week but that does not seem to have helped. He has smaller areas on the left lateral lower leg and ankle which also look ischemic. We sent him to see Dr. Randie Heinz unfortunately his kerlix and Coban wrap was not removed. They were able to document that his bilateral common iliac  artery stents and left SFA stents were patent based on duplex. However they did not look at the tibial vessels toe brachial pressures etc. I am not sure whether his original angiograms looked at these in any detail. 5/3; the patient has a substantial wound on the left anterior mid tibia smaller wound on the left lateral ankle both of these very painful. He also has an area on the right dorsal foot. I did speak with Dr. Randie Heinz last week they are trying to get him into the office to look at his arterial status especially in the  left lower leg. The stents previously placed in the iliacs bilaterally and the left SFA were patent. We are using Hydrofera Blue 5/10; after I spoke with Dr. Randie Heinz. Secure text message apparently his office call the patient 2 weeks ago but he has not heard anything since. I was hoping that they would do noninvasive studies on the left lower leg. Unfortunately there is absolutely no change in this wound considerably long traumatic wound on the anterior tibia and the area on the lateral lower ankle. We have been using Medihoney under calcium alginate. He also has an area on the right dorsal foot. We are using silver alginate here. Electronic Signature(s) Signed: 10/16/2019 8:11:21 AM By: Baltazar Najjar MD Entered By: Baltazar Najjar on 10/15/2019 15:08:14 -------------------------------------------------------------------------------- Physician Orders Details Patient Name: Date of Service: THA [name], [name] 10/15/2019 1:45 PM Medical Record Number: [id number] Patient Account Number: [id number] Date of Birth/Sex: Treating RN: May 12, 1951 (69 y.o. 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 Dressing Change Frequency Do not change entire dressing for one week. - all wounds Skin Barriers/Peri-Wound Care Barrier  cream Moisturizing lotion TCA Cream or Ointment - mix with lotion Wound Cleansing May shower with protection. - use cast protector Primary Wound Dressing Wound #1 Left,Distal,Anterior Lower Leg Medihoney Alginate Wound #2 Left,Lateral Ankle Medihoney Alginate Wound #3 Left,Dorsal Foot Medihoney Alginate Wound #4 Right,Dorsal Foot Calcium Alginate Secondary Dressing Wound #1 Left,Distal,Anterior Lower Leg Dry Gauze ABD pad Wound #2 Left,Lateral Ankle Dry Gauze ABD pad Wound #3 Left,Dorsal Foot Dry Gauze ABD pad Wound #4 Right,Dorsal Foot Dry Gauze Edema Control Kerlix and Coban - Bilateral - ****LIGHTLY WRAP**** Avoid standing for long periods of time Elevate legs to the level of the heart or above for 30 minutes daily and/or when sitting, a frequency of: - throughout the day Electronic Signature(s) Signed: 10/16/2019 8:11:21 AM By: Baltazar Najjar MD Signed: 10/16/2019 5:17:28 PM By: Zandra Abts RN, BSN Entered By: Zandra Abts on 10/15/2019 14:51:49 -------------------------------------------------------------------------------- Problem List Details Patient Name: Date of Service: THA [name], [name] 10/15/2019 1:45 PM Medical Record Number: [id number] Patient Account Number: [id number] Date of Birth/Sex: Treating RN: 11/26/1950 (69 y.o. 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 08/27/2019 No Yes I70.243 Atherosclerosis of native arteries of left leg with ulceration of ankle 08/27/2019 No Yes L97.828 Non-pressure chronic ulcer of other part of left lower leg with other specified 08/27/2019 No Yes severity L97.322 Non-pressure chronic ulcer of left ankle with fat layer exposed  08/27/2019 No Yes L97.518 Non-pressure chronic ulcer of other part of right foot with other specified 10/08/2019 No Yes severity Inactive Problems Resolved Problems Electronic Signature(s) Signed: 10/16/2019 8:11:21 AM By: Baltazar Najjar MD Entered By: Baltazar Najjar on 10/15/2019 15:06:18 -------------------------------------------------------------------------------- Progress Note Details Patient Name: Date of Service: THA [name], [name] 10/15/2019 1:45 PM Medical Record Number: [id number] Patient Account Number: [id number] Date of Birth/Sex: Treating RN: March 13, 1951 (69 y.o. [name] Primary Care Provider: PA Zenovia Jordan, NO Other Clinician: Referring Provider: Treating Provider/Extender: Morrie Sheldon, [place] RO LINE Weeks in Treatment: 7 Subjective History of Present Illness (HPI) ADMISSION 08/27/2019 This is a 68 year old man who tells me that he fell going up stairs about a year ago resulting in a deep laceration type wound on the left anterior mid tibia. He was soon found to have significant PAD/critical limb ischemia. He had had previous noninvasive studies in 2017 showing an ABI of 0.82 on the right and 0.91 on the left with monophasic to biphasic waveforms. On 07/18/2019 his ABI in the right was 0.58 on the left 0.52 with TBI's of 0.51 on the right and 0 on the left. He underwent an angiogram by Dr. Randie Heinz on 3/1. He underwent bilateral iliac artery stents he was also discovered to have a left-sided SFA with it was patent up to 10 centimeters proximal to the adductor canal but then became stenotic with a 95% stenosis for 15 cm. Dominant runoff is via the posterior tibial artery. The patient still has the wound on the left anterior mid tibia. He has been using Vaseline to this. He wraps this and gauze and some form of Ace wrap. He complains bitterly of the pain also complains about falling with the leg giving out on him. About 2 weeks ago he developed a new wound on the  left lateral ankle. He had a DVT rule out study on 3/16/ 1 that was negative for a DVT Past medical history includes cervical spondylosis, left carotid bruit, marked PAD. ABI in our clinic was 0.71 on the left 3/29; patient admitted to the clinic last week with a laceration type injury on the left anterior mid tibia in the setting of severe PAD. He was supposed to follow- up with Dr. Randie Heinz last week but he tells me he had to cancel the appointment because of nausea and vomiting. I have encouraged him to rebook this ASAP. He tolerated our kerlix Coban wrap without incidence that the pain was better. We used Iodoflex to the wound area 4/5; working on getting him back in with Dr. Randie Heinz. We have been using Iodoflex under Curlex and Coban. He seems to be tolerating this 4/12; still complaining of a lot of pain. Wound itself on the left anterior mid tibia looks about the same perhaps somewhat better looking tissue. he has a new area on the left medial lower leg and ankle. He has previously had bilateral external iliac artery stents. Still complaining of a lot of pain 4/16; still complaining of a lot of pain. He has noninvasive studies on Friday morning and  sees Dr. Donzetta Matters after this. Large wound on the left anterior tibia. Smaller areas on the left lateral lower leg and ankle. He arrives in clinic today with blisters on his dorsal right leg is swollen 4/26; still complaining of a lot of pain I changed him to [facility] from Iodoflex last week but that does not seem to have helped. He has smaller areas on the left lateral lower leg and ankle which also look ischemic. We sent him to see Dr. Donzetta Matters unfortunately his kerlix and Coban wrap was not removed. They were able to document that his bilateral common iliac artery stents and left SFA stents were patent based on duplex. However they did not look at the tibial vessels toe brachial pressures etc. I am not sure whether his original angiograms looked at these in  any detail. 5/3; the patient has a substantial wound on the left anterior mid tibia smaller wound on the left lateral ankle both of these very painful. He also has an area on the right dorsal foot. I did speak with Dr. Donzetta Matters last week they are trying to get him into the office to look at his arterial status especially in the left lower leg. The stents previously placed in the iliacs bilaterally and the left SFA were patent. We are using Hydrofera Blue 5/10; after I spoke with Dr. Donzetta Matters. Secure text message apparently his office call the patient 2 weeks ago but he has not heard anything since. I was hoping that they would do noninvasive studies on the left lower leg. Unfortunately there is absolutely no change in this wound considerably long traumatic wound on the anterior tibia and the area on the lateral lower ankle. We have been using Medihoney under calcium alginate. He also has an area on the right dorsal foot. We are using silver alginate here. Objective Constitutional Vitals Time Taken: 1:55 PM, Height: 72 in, Weight: 130 lbs, BMI: 17.6, Temperature: 97.5 F, Pulse: 92 bpm, Respiratory Rate: 18 breaths/min, Blood Pressure: 106/65 mmHg. Integumentary (Hair, Skin) Wound #1 status is Open. Original cause of wound was Trauma. The wound is located on the [facility] Lower Leg. The wound measures 10.2cm length x 4.3cm width x 0.2cm depth; 34.448cm^2 area and 6.89cm^3 volume. There is Fat Layer (Subcutaneous Tissue) Exposed exposed. There is no tunneling or undermining noted. There is a medium amount of purulent drainage noted. The wound margin is well defined and not attached to the wound base. There is medium (34-66%) pink granulation within the wound bed. There is a medium (34-66%) amount of necrotic tissue within the wound bed including Adherent Slough. Wound #2 status is Open. Original cause of wound was Blister. The wound is located on the Left,Lateral Ankle. The wound measures 1.8cm  length x 1.6cm width x 0.2cm depth; 2.262cm^2 area and 0.452cm^3 volume. There is no tunneling or undermining noted. There is a small amount of serosanguineous drainage noted. The wound margin is distinct with the outline attached to the wound base. There is no granulation within the wound bed. There is a large (67-100%) amount of necrotic tissue within the wound bed including Adherent Slough. Wound #3 status is Open. Original cause of wound was Gradually Appeared. The wound is located on the Left,Dorsal Foot. The wound measures 0.4cm length x 0.4cm width x 0.1cm depth; 0.126cm^2 area and 0.013cm^3 volume. There is Fat Layer (Subcutaneous Tissue) Exposed exposed. There is no tunneling or undermining noted. There is a none present amount of drainage noted. The wound margin is distinct  with the outline attached to the wound base. There is no granulation within the wound bed. There is a large (67-100%) amount of necrotic tissue within the wound bed including Adherent Slough. Wound #4 status is Open. Original cause of wound was Blister. The wound is located on the Right,Dorsal Foot. The wound measures 1.8cm length x 2cm width x 0.1cm depth; 2.827cm^2 area and 0.283cm^3 volume. There is no tunneling or undermining noted. There is a medium amount of serous drainage noted. The wound margin is flat and intact. There is small (1-33%) pink granulation within the wound bed. There is a large (67-100%) amount of necrotic tissue within the wound bed including Adherent Slough. 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 Dressing Change Frequency: Do not change entire dressing for one week. - all wounds Skin Barriers/Peri-Wound Care: Barrier cream Moisturizing lotion TCA Cream or Ointment - mix with lotion Wound Cleansing: May shower with protection. - use cast protector Primary Wound Dressing: Wound #1 Left,Distal,Anterior Lower Leg: Medihoney Alginate Wound #2 Left,Lateral Ankle: Medihoney Alginate Wound #3 Left,Dorsal Foot: Medihoney Alginate Wound #4 Right,Dorsal Foot: Calcium Alginate Secondary Dressing: Wound #1 Left,Distal,Anterior Lower Leg: Dry Gauze ABD pad Wound #2 Left,Lateral Ankle: Dry Gauze ABD pad Wound #3 Left,Dorsal Foot: Dry Gauze ABD pad Wound #4 Right,Dorsal Foot: Dry Gauze Edema Control: Kerlix and Coban - Bilateral - ****LIGHTLY WRAP**** Avoid standing for long periods of time Elevate legs to the level of the heart or above for 30 minutes daily and/or when sitting, a frequency of: - throughout the day 1. Point we continue with the same dressings. The best I can say is that the wounds are unchanged 2. No evidence of infection but if these become infected I think this will be a limb threatening situation. 3. We will attempt to call vein and vascular and see where we are with this. This was the result of them not taking the wrap off when he had his original noninvasive test 4. 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 10/15/2019 Medical Record Number: [id number] Patient Account Number: [id number] Date of Birth/Sex: Treating RN: February 16, 1951 [age] y.o. 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 I70.243 Atherosclerosis of native arteries of left leg with ulceration of ankle L97.828 Non-pressure chronic ulcer of other part of left lower leg with other specified severity L97.322 Non-pressure chronic ulcer of left ankle with fat layer exposed L97.518 Non-pressure chronic ulcer of other part of right foot with other specified severity Facility Procedures CPT4 Code: [id number] Description: [id number] - WOUND CARE VISIT-LEV 5 EST PT Modifier: Quantity: 1 Physician Procedures : CPT4 Code Description Modifier [id number] - WC PHYS LEVEL 3 - EST PT ICD-10 Diagnosis Description I70.242 Atherosclerosis of native arteries of left  leg with ulceration of calf I70.243 Atherosclerosis of native arteries of left leg with ulceration  of ankle L97.828 Non-pressure chronic ulcer of other part of left lower leg with other specified severity L97.322 Non-pressure chronic ulcer of left ankle with fat layer exposed Quantity: 1 Electronic Signature(s) Signed: 10/16/2019 5:17:28 PM By: Zandra Abts RN, BSN Signed: 10/16/2019 6:13:26 PM By: Baltazar Najjar MD Previous Signature: 10/16/2019 8:11:21 AM Version By: Baltazar Najjar MD Entered By: Zandra Abts on 10/16/2019 08:24:04

## 2019-10-16 NOTE — Progress Notes (Signed)
Matthew [name] ([id number]) . Visit Report for 10/15/2019 Arrival Information Details Patient Name: Date of Service: THA Matthew [name] [facility] [name]. 10/15/2019 1:45 PM Medical Record Number: [id number] Patient Account Number: [id number] Date of Birth/Sex: Treating RN: 06-05-1951 (69 y.o. Matthew [name] Right Primary Care Parag Dorton: PA TIENT, NO Other Clinician: Referring Raun Routh: Treating Shonique Pelphrey/Extender: Morrie Sheldon, [place] RO LINE Weeks in Treatment: 7 Visit Information History Since Last Visit Added or deleted any medications: No Patient Arrived: Matthew [name] Any new allergies or adverse reactions: No Arrival Time: 13:58 Had a fall or experienced change in No Accompanied By: self activities of daily living that may affect Transfer Assistance: None risk of falls: Patient Identification Verified: Yes Signs or symptoms of abuse/neglect since last visito No Secondary Verification Process Completed: Yes Hospitalized since last visit: No Patient Requires Transmission-Based Precautions: No Implantable device outside of the clinic excluding No Patient Has Alerts: No cellular tissue based products placed in the center since last visit: Has Dressing in Place as Prescribed: Yes Pain Present Now: Yes Electronic Signature(s) Signed: 10/16/2019 5:14:55 PM By: Cherylin Mylar Entered By: Cherylin Mylar on 10/15/2019 13:58:42 -------------------------------------------------------------------------------- Clinic Level of Care Assessment Details Patient Name: Date of Service: THA Matthew [name], Matthew [name]. 10/15/2019 1:45 PM Medical Record Number: [id number] Patient Account Number: [id number] Date of Birth/Sex: Treating RN: January 05, 1951 (69 y.o. Matthew [name] Primary Care Laverne Hursey: PA TIENT, NO Other Clinician: Referring Shaunice Levitan: Treating Kinzee Happel/Extender: Morrie Sheldon, [place] RO LINE Weeks in Treatment: 7 Clinic Level of Care Assessment Items TOOL 4 Quantity Score X- 1 0 Use when  only an EandM is performed on FOLLOW-UP visit ASSESSMENTS - Nursing Assessment / Reassessment X- 1 10 Reassessment of Co-morbidities (includes updates in patient status) X- 1 5 Reassessment of Adherence to Treatment Plan ASSESSMENTS - Wound and Skin A ssessment / Reassessment []  - 0 Simple Wound Assessment / Reassessment - one wound X- 4 5 Complex Wound Assessment / Reassessment - multiple wounds []  - 0 Dermatologic / Skin Assessment (not related to wound area) ASSESSMENTS - Focused Assessment []  - 0 Circumferential Edema Measurements - multi extremities []  - 0 Nutritional Assessment / Counseling / Intervention X- 1 5 Lower Extremity Assessment (monofilament, tuning fork, pulses) []  - 0 Peripheral Arterial Disease Assessment (using hand held doppler) ASSESSMENTS - Ostomy and/or Continence Assessment and Care []  - 0 Incontinence Assessment and Management []  - 0 Ostomy Care Assessment and Management (repouching, etc.) PROCESS - Coordination of Care X - Simple Patient / Family Education for ongoing care 1 15 []  - 0 Complex (extensive) Patient / Family Education for ongoing care X- 1 10 Staff obtains , Records, [name] Results / Process Orders est []  - 0 Staff telephones HHA, Nursing Homes / Clarify orders / etc []  - 0 Routine Transfer to another Facility (non-emergent condition) []  - 0 Routine Hospital Admission (non-emergent condition) []  - 0 New Admissions / / Ordering NPWT Apligraf, etc. , []  - 0 Emergency Hospital Admission (emergent condition) X- 1 10 Simple Discharge Coordination []  - 0 Complex (extensive) Discharge Coordination PROCESS - Special Needs []  - 0 Pediatric / Minor Patient Management []  - 0 Isolation Patient Management []  - 0 Hearing / Language / Visual special needs []  - 0 Assessment of Community assistance (transportation, D/C planning, etc.) []  - 0 Additional assistance / Altered mentation []  - 0 Support  Surface(s) Assessment (bed, cushion, seat, etc.) INTERVENTIONS - Wound Cleansing / Measurement []  - 0 Simple Wound Cleansing -  one wound X- 4 5 Complex Wound Cleansing - multiple wounds X- 1 5 Wound Imaging (photographs - any number of wounds)  - 0 Wound Tracing (instead of photographs)  - 0 Simple Wound Measurement - one wound X- 4 5 Complex Wound Measurement - multiple wounds INTERVENTIONS - Wound Dressings  - 0 Small Wound Dressing one or multiple wounds  - 0 Medium Wound Dressing one or multiple wounds X- 2 20 Large Wound Dressing one or multiple wounds  - 0 Application of Medications - topical  - 0 Application of Medications - injection INTERVENTIONS - Miscellaneous  - 0 External ear exam  - 0 Specimen Collection (cultures, biopsies, blood, body fluids, etc.)  - 0 Specimen(s) / Culture(s) sent or taken to Lab for analysis  - 0 Patient Transfer (multiple staff / Nurse, adult / Similar devices)  - 0 Simple Staple / Suture removal (25 or less)  - 0 Complex Staple / Suture removal (26 or more)  - 0 Hypo / Hyperglycemic Management (close monitor of Blood Glucose)  - 0 Ankle / Brachial Index (ABI) - do not check if billed separately X- 1 5 Vital Signs Has the patient been seen at the hospital within the last three years: Yes Total Score: 165 Level Of Care: New/Established - Level 5 Electronic Signature(s) Signed: 10/16/2019 5:17:28 PM By: Zandra Abts RN, BSN Entered By: Zandra Abts on 10/16/2019 08:23:50 -------------------------------------------------------------------------------- Encounter Discharge Information Details Patient Name: Date of Service: THA Matthew [name], Matthew [name]. 10/15/2019 1:45 PM Medical Record Number: [id number] Patient Account Number: [id number] Date of Birth/Sex: Treating RN: Jun 18, 1950 (69 y.o. Matthew [name] Right Primary Care Matthew [name]: PA Zenovia Jordan, NO Other Clinician: Referring Chianti Goh: Treating Blakeley Scheier/Extender:  Morrie Sheldon, [place] RO LINE Weeks in Treatment: 7 Encounter Discharge Information Items Discharge Condition: Stable Ambulatory Status: Cane Discharge Destination: Home Transportation: Private Auto Accompanied By: self Schedule Follow-up Appointment: Yes Clinical Summary of Care: Patient Declined Electronic Signature(s) Signed: 10/16/2019 5:14:55 PM By: Cherylin Mylar Entered By: Cherylin Mylar on 10/15/2019 15:15:08 -------------------------------------------------------------------------------- Lower Extremity Assessment Details Patient Name: Date of Service: THA Matthew [name], Matthew [name]. 10/15/2019 1:45 PM Medical Record Number: [id number] Patient Account Number: [id number] Date of Birth/Sex: Treating RN: [birth year] (69 y.o. Matthew [name] Right Primary Care Danija Gosa: PA Zenovia Jordan, NO Other Clinician: Referring Elverda Wendel: Treating Matthew [name]/Extender: Morrie Sheldon, [place] RO LINE Weeks in Treatment: 7 Edema Assessment Assessed: [Left: No] [Right: No] Edema: [Left: Yes] [Right: Yes] Calf Left: Right: Point of Measurement: 41 cm From Medial Instep 32 cm 31.5 cm Ankle Left: Right: Point of Measurement: 10 cm From Medial Instep 24 cm 24 cm Vascular Assessment Pulses: Dorsalis Pedis Palpable: [Left:No] [Right:No] Electronic Signature(s) Signed: 10/16/2019 5:14:55 PM By: Cherylin Mylar Entered By: Cherylin Mylar on 10/15/2019 14:00:45 -------------------------------------------------------------------------------- Multi Wound Chart Details Patient Name: Date of Service: THA Matthew [name], Matthew [name]. 10/15/2019 1:45 PM Medical Record Number: [id number] Patient Account Number: [id number] Date of Birth/Sex: Treating RN: 1950/11/01 (69 y.o. Matthew [name] Primary Care Cozetta Seif: PA Zenovia Jordan, NO Other Clinician: Referring Vardaan Depascale: Treating Geral Coker/Extender: Morrie Sheldon, [place] RO LINE Weeks in Treatment: 7 Vital Signs Height(in): 72 Pulse(bpm):  92 Weight(lbs): 130 Blood Pressure(mmHg): 106/65 Body Mass Index(BMI): 18 Temperature(F): 97.5 Respiratory Rate(breaths/min): 18 Photos: [1:No Photos Left, Distal, Anterior Lower Leg] [2:No Photos Left, Lateral Ankle] [3:No Photos Left, Dorsal Foot] Wound Location: [1:Trauma] [2:Blister] [3:Gradually Appeared] Wounding Event: [1:Arterial Insufficiency Ulcer] [2:Arterial Insufficiency Ulcer] [3:Arterial Insufficiency Ulcer] Primary Etiology: [1:Peripheral Arterial Disease] [2:Peripheral  Arterial Disease] [3:Peripheral Arterial Disease] Comorbid History: [1:07/08/2018] [2:08/06/2019] [3:09/17/2019] Date Acquired: [1:7] [2:7] [3:4] Weeks of Treatment: [1:Open] [2:Open] [3:Open] Wound Status: [1:10.2x4.3x0.2] [2:1.8x1.6x0.2] [3:0.4x0.4x0.1] Measurements L x W x D (cm) [1:34.448] [2:2.262] [3:0.126] A (cm) : rea [1:6.89] [2:0.452] [3:0.013] Volume (cm) : [1:-67.10%] [2:-722.50%] [3:35.70%] % Reduction in A rea: [1:-67.10%] [2:-1574.10%] [3:35.00%] % Reduction in Volume: [1:Full Thickness Without Exposed] [2:Full Thickness Without Exposed] [3:Full Thickness Without Exposed] Classification: [1:Support Structures Medium] [2:Support Structures Small] [3:Support Structures None Present] Exudate Amount: [1:Purulent] [2:Serosanguineous] [3:[name]/A] Exudate Type: [1:yellow, brown, green] [2:red, brown] [3:[name]/A] Exudate Color: [1:Well defined, not attached] [2:Distinct, outline attached] [3:Distinct, outline attached] Wound Margin: [1:Medium (34-66%)] [2:None Present (0%)] [3:None Present (0%)] Granulation Amount: [1:Pink] [2:[name]/A] [3:[name]/A] Granulation Quality: [1:Medium (34-66%)] [2:Large (67-100%)] [3:Large (67-100%)] Necrotic Amount: [1:Fat Layer (Subcutaneous Tissue)] [2:Fascia: No] [3:Fat Layer (Subcutaneous Tissue)] Exposed Structures: [1:Exposed: Yes Fascia: No Tendon: No Muscle: No Joint: No Bone: No Small (1-33%)] [2:Fat Layer (Subcutaneous Tissue) Exposed: No Tendon: No Muscle: No Joint: No Bone:  No None] [3:Exposed: Yes Fascia: No Tendon: No Muscle: No Joint: No Bone: No None] Wound Number: 4 [name]/A [name]/A Photos: No Photos [name]/A [name]/A Right, Dorsal Foot [name]/A [name]/A Wound Location: Blister [name]/A [name]/A Wounding Event: Skin [name] ear [name]/A [name]/A Primary Etiology: Peripheral Arterial Disease [name]/A [name]/A Comorbid History: 09/24/2019 [name]/A [name]/A Date Acquired: 3 [name]/A [name]/A Weeks of Treatment: Open [name]/A [name]/A Wound Status: 1.8x2x0.1 [name]/A [name]/A Measurements L x W x D (cm) 2.827 [name]/A [name]/A A (cm) : rea 0.283 [name]/A [name]/A Volume (cm) : 42.40% [name]/A [name]/A % Reduction in A rea: 42.40% [name]/A [name]/A % Reduction in Volume: Full Thickness Without Exposed [name]/A [name]/A Classification: Support Structures Medium [name]/A [name]/A Exudate Amount: Serous [name]/A [name]/A Exudate Type: amber [name]/A [name]/A Exudate Color: Flat and Intact [name]/A [name]/A Wound Margin: Small (1-33%) [name]/A [name]/A Granulation Amount: Pink [name]/A [name]/A Granulation Quality: Large (67-100%) [name]/A [name]/A Necrotic Amount: Fascia: No [name]/A [name]/A Exposed Structures: Fat Layer (Subcutaneous Tissue) Exposed: No Tendon: No Muscle: No Joint: No Bone: No None [name]/A [name]/A Epithelialization: Treatment Notes Electronic Signature(s) Signed: 10/16/2019 8:11:21 AM By: Matthew [name], Michael MD Signed: 10/16/2019 5:17:28 PM By: Zandra AbtsLynch, Shatara RN, BSN Entered By: Matthew [name], Matthew [name] on 10/15/2019 15:06:25 -------------------------------------------------------------------------------- Multi-Disciplinary Care Plan Details Patient Name: Date of Service: THA Matthew [name], Matthew [name]. 10/15/2019 1:45 PM Medical Record Number: [id number] Patient Account Number: [id number] Date of Birth/Sex: Treating RN: 1950-06-20 33(68 y.o. Matthew KollerM) Lynch, Shatara Primary Care Semaya Vida: PA Zenovia JordanIENT, NO Other Clinician: Referring Tarin Navarez: Treating Rmani Kapusta/Extender: Morrie Sheldonobson, Matthew [name] A BERMA [name], [place] RO LINE Weeks in Treatment: 7 Active Inactive Abuse / Safety / Falls / Self Care Management Nursing Diagnoses: Potential for falls Potential for injury related to  falls Goals: Patient will not experience any injury related to falls Date Initiated: 08/27/2019 Date Inactivated: 09/24/2019 Target Resolution Date: 09/28/2019 Goal Status: Unmet Unmet Reason: multiple falls Patient/caregiver will verbalize/demonstrate measures taken to prevent injury and/or falls Date Initiated: 08/27/2019 Target Resolution Date: 10/26/2019 Goal Status: Active Interventions: Assess Activities of Daily Living upon admission and as needed Assess fall risk on admission and as needed Assess: immobility, friction, shearing, incontinence upon admission and as needed Assess impairment of mobility on admission and as needed per policy Assess personal safety and home safety (as indicated) on admission and as needed Provide education on fall prevention Provide education on personal and home safety Notes: Tissue Oxygenation Nursing Diagnoses: Actual ineffective tissue perfusion; peripheral (select once diagnosis is confirmed) Knowledge deficit related to disease process and management Goals:  Patient/caregiver will verbalize understanding of disease process and disease management Date Initiated: 08/27/2019 Target Resolution Date: 10/26/2019 Goal Status: Active Interventions: Assess patient understanding of disease process and management upon diagnosis and as needed Assess peripheral arterial status upon admission and as needed Provide education on tissue oxygenation and ischemia Notes: Wound/Skin Impairment Nursing Diagnoses: Impaired tissue integrity Knowledge deficit related to ulceration/compromised skin integrity Goals: Patient/caregiver will verbalize understanding of skin care regimen Date Initiated: 08/27/2019 Target Resolution Date: 10/26/2019 Goal Status: Active Ulcer/skin breakdown will have a volume reduction of 30% by week 4 Date Initiated: 08/27/2019 Date Inactivated: 09/24/2019 Target Resolution Date: 09/28/2019 Unmet Reason: PAD, non viable Goal Status:  Unmet surface Interventions: Assess patient/caregiver ability to obtain necessary supplies Assess patient/caregiver ability to perform ulcer/skin care regimen upon admission and as needed Assess ulceration(s) every visit Provide education on ulcer and skin care Notes: Electronic Signature(s) Signed: 10/16/2019 5:17:28 PM By: Matthew Hurst RN, BSN Entered By: Matthew [name] on 10/15/2019 14:52:49 -------------------------------------------------------------------------------- Pain Assessment Details Patient Name: Date of Service: THA Matthew [name], Matthew [name]. 10/15/2019 1:45 PM Medical Record Number: [id number] Patient Account Number: [id number] Date of Birth/Sex: Treating RN: Feb 22, 1951 (69 y.o. Marvis Repress Primary Care Tydus Sanmiguel: PA Haig Prophet, NO Other Clinician: Referring Noal Abshier: Treating Jakaya Jacobowitz/Extender: Lehman Prom, [place] RO LINE Weeks in Treatment: 7 Active Problems Location of Pain Severity and Description of Pain Patient Has Paino Yes Site Locations Pain Location: Pain Location: Generalized Pain, Pain in Ulcers With Dressing Change: Yes Duration of the Pain. Constant / Intermittento Constant Rate the pain. Current Pain Level: 10 Worst Pain Level: 10 Least Pain Level: 8 Tolerable Pain Level: 6 Character of Pain Describe the Pain: Aching, Burning, Shooting Pain Management and Medication Current Pain Management: Rest: Yes How does your wound impact your activities of daily livingo Sleep: Yes Appetite: Yes Electronic Signature(s) Signed: 10/16/2019 5:14:55 PM By: Kela Millin Entered By: Kela Millin on 10/15/2019 13:59:41 -------------------------------------------------------------------------------- Patient/Caregiver Education Details Patient Name: Date of Service: THA Matthew [name], Matthew [name]. 5/10/2021andnbsp1:45 PM Medical Record Number: [id number] Patient Account Number: [id number] Date of Birth/Gender: Treating RN: 06-Dec-1950 (69 y.o. [name] Primary Care Physician: PA Haig Prophet, NO Other Clinician: Referring Physician: Treating Physician/Extender: Lehman Prom, [place] RO LINE Weeks in Treatment: 7 Education Assessment Education Provided To: Patient Education Topics Provided Wound/Skin Impairment: Methods: Explain/Verbal Responses: State content correctly Electronic Signature(s) Signed: 10/16/2019 5:17:28 PM By: Matthew Hurst RN, BSN Entered By: Matthew [name] on 10/15/2019 14:53:01 -------------------------------------------------------------------------------- Wound Assessment Details Patient Name: Date of Service: THA Matthew [name], Matthew [name]. 10/15/2019 1:45 PM Medical Record Number: [id number] Patient Account Number: [id number] Date of Birth/Sex: Treating RN: 1951-05-21 (69 y.o. Marvis Repress Primary Care Willadeen Colantuono: PA TIENT, NO Other Clinician: Referring Ameria Sanjurjo: Treating Darnice Comrie/Extender: Lehman Prom, [place] RO LINE Weeks in Treatment: 7 Wound Status Wound Number: 1 Primary Etiology: Arterial Insufficiency Ulcer Wound Location: Left, Distal, Anterior Lower Leg Wound Status: Open Wounding Event: Trauma Comorbid History: Peripheral Arterial Disease Date Acquired: 07/08/2018 Weeks Of Treatment: 7 Clustered Wound: No Photos Photo Uploaded By: Mikeal Hawthorne on 10/16/2019 09:16:39 Wound Measurements Length: (cm) 10.2 Width: (cm) 4.3 Depth: (cm) 0.2 Area: (cm) 34.448 Volume: (cm) 6.89 % Reduction in Area: -67.1% % Reduction in Volume: -67.1% Epithelialization: Small (1-33%) Tunneling: No Undermining: No Wound Description Classification: Full Thickness Without Exposed Support Structu Wound Margin: Well defined, not attached Exudate Amount: Medium Exudate Type: Purulent Exudate Color: yellow, brown, green res Foul Odor After  Cleansing: No Slough/Fibrino Yes Wound Bed Granulation Amount: Medium (34-66%) Exposed Structure Granulation Quality: Pink Fascia Exposed:  No Necrotic Amount: Medium (34-66%) Fat Layer (Subcutaneous Tissue) Exposed: Yes Necrotic Quality: Adherent Slough Tendon Exposed: No Muscle Exposed: No Joint Exposed: No Bone Exposed: No Treatment Notes Wound #1 (Left, Distal, Anterior Lower Leg) 1. Cleanse With Wound Cleanser Soap and water 2. Periwound Care Moisturizing lotion TCA Cream 3. Primary Dressing Applied Calcium Alginate Other primary dressing (specifiy in notes) 4. Secondary Dressing ABD Pad Dry Gauze 6. Support Layer Applied Kerlix/Coban Notes Solicitor) Signed: 10/16/2019 5:14:55 PM By: Cherylin Mylar Entered By: Cherylin Mylar on 10/15/2019 14:05:18 -------------------------------------------------------------------------------- Wound Assessment Details Patient Name: Date of Service: THA Matthew [name], Matthew [name]. 10/15/2019 1:45 PM Medical Record Number: [id number] Patient Account Number: [id number] Date of Birth/Sex: Treating RN: 1950/10/16 (69 y.o. Matthew [name] Right Primary Care Leiby Pigeon: PA TIENT, NO Other Clinician: Referring Mallory Enriques: Treating Jenika Chiem/Extender: Morrie Sheldon, [place] RO LINE Weeks in Treatment: 7 Wound Status Wound Number: 2 Primary Etiology: Arterial Insufficiency Ulcer Wound Location: Left, Lateral Ankle Wound Status: Open Wounding Event: Blister Comorbid History: Peripheral Arterial Disease Date Acquired: 08/06/2019 Weeks Of Treatment: 7 Clustered Wound: No Photos Photo Uploaded By: Benjaman Kindler on 10/16/2019 09:17:09 Wound Measurements Length: (cm) 1.8 Width: (cm) 1.6 Depth: (cm) 0.2 Area: (cm) 2.262 Volume: (cm) 0.452 % Reduction in Area: -722.5% % Reduction in Volume: -1574.1% Epithelialization: None Tunneling: No Undermining: No Wound Description Classification: Full Thickness Without Exposed Support Structures Wound Margin: Distinct, outline attached Exudate Amount: Small Exudate Type: Serosanguineous Exudate Color:  red, brown Foul Odor After Cleansing: No Slough/Fibrino Yes Wound Bed Granulation Amount: None Present (0%) Exposed Structure Necrotic Amount: Large (67-100%) Fascia Exposed: No Necrotic Quality: Adherent Slough Fat Layer (Subcutaneous Tissue) Exposed: No Tendon Exposed: No Muscle Exposed: No Joint Exposed: No Bone Exposed: No Treatment Notes Wound #2 (Left, Lateral Ankle) 1. Cleanse With Wound Cleanser Soap and water 2. Periwound Care Moisturizing lotion TCA Cream 3. Primary Dressing Applied Calcium Alginate Other primary dressing (specifiy in notes) 4. Secondary Dressing ABD Pad Dry Gauze 6. Support Layer Applied Kerlix/Coban Notes Solicitor) Signed: 10/16/2019 5:14:55 PM By: Cherylin Mylar Entered By: Cherylin Mylar on 10/15/2019 14:05:43 -------------------------------------------------------------------------------- Wound Assessment Details Patient Name: Date of Service: THA Matthew [name], Matthew [name]. 10/15/2019 1:45 PM Medical Record Number: [id number] Patient Account Number: [id number] Date of Birth/Sex: Treating RN: [birth year] (69 y.o. Matthew [name] Right Primary Care Kendyl Festa: PA TIENT, NO Other Clinician: Referring Tenia Goh: Treating Lynzie Cliburn/Extender: Morrie Sheldon, [place] RO LINE Weeks in Treatment: 7 Wound Status Wound Number: 3 Primary Etiology: Arterial Insufficiency Ulcer Wound Location: Left, Dorsal Foot Wound Status: Open Wounding Event: Gradually Appeared Comorbid History: Peripheral Arterial Disease Date Acquired: 09/17/2019 Weeks Of Treatment: 4 Clustered Wound: No Photos Photo Uploaded By: Benjaman Kindler on 10/16/2019 09:17:10 Wound Measurements Length: (cm) 0.4 Width: (cm) 0.4 Depth: (cm) 0.1 Area: (cm) 0.126 Volume: (cm) 0.013 % Reduction in Area: 35.7% % Reduction in Volume: 35% Epithelialization: None Tunneling: No Undermining: No Wound Description Classification: Full Thickness Without  Exposed Support Structures Wound Margin: Distinct, outline attached Exudate Amount: None Present Foul Odor After Cleansing: No Slough/Fibrino Yes Wound Bed Granulation Amount: None Present (0%) Exposed Structure Necrotic Amount: Large (67-100%) Fascia Exposed: No Necrotic Quality: Adherent Slough Fat Layer (Subcutaneous Tissue) Exposed: Yes Tendon Exposed: No Muscle Exposed: No Joint Exposed: No Bone Exposed: No Treatment Notes Wound #3 (Left, Dorsal Foot) 1. Cleanse With Wound Cleanser  Soap and water 2. Periwound Care Moisturizing lotion TCA Cream 3. Primary Dressing Applied Calcium Alginate Other primary dressing (specifiy in notes) 4. Secondary Dressing ABD Pad Dry Gauze 6. Support Layer Applied Kerlix/Coban Notes Solicitor) Signed: 10/16/2019 5:14:55 PM By: Cherylin Mylar Entered By: Cherylin Mylar on 10/15/2019 14:06:00 -------------------------------------------------------------------------------- Wound Assessment Details Patient Name: Date of Service: THA Matthew [name], Matthew [name]. 10/15/2019 1:45 PM Medical Record Number: [id number] Patient Account Number: [id number] Date of Birth/Sex: Treating RN: [birth year] (69 y.o. Matthew [name] Right Primary Care Alesandra Smart: PA TIENT, NO Other Clinician: Referring Jozie Wulf: Treating Matthew [name]/Extender: Morrie Sheldon, [place] RO LINE Weeks in Treatment: 7 Wound Status Wound Number: 4 Primary Etiology: Skin Tear Wound Location: Right, Dorsal Foot Wound Status: Open Wounding Event: Blister Comorbid History: Peripheral Arterial Disease Date Acquired: 09/24/2019 Weeks Of Treatment: 3 Clustered Wound: No Photos Photo Uploaded By: Benjaman Kindler on 10/16/2019 09:16:40 Wound Measurements Length: (cm) 1.8 Width: (cm) 2 Depth: (cm) 0.1 Area: (cm) 2.827 Volume: (cm) 0.283 % Reduction in Area: 42.4% % Reduction in Volume: 42.4% Epithelialization: None Tunneling: No Undermining: No Wound  Description Classification: Full Thickness Without Exposed Support Stru Wound Margin: Flat and Intact Exudate Amount: Medium Exudate Type: Serous Exudate Color: amber ctures Foul Odor After Cleansing: No Slough/Fibrino Yes Wound Bed Granulation Amount: Small (1-33%) Exposed Structure Granulation Quality: Pink Fascia Exposed: No Necrotic Amount: Large (67-100%) Fat Layer (Subcutaneous Tissue) Exposed: No Necrotic Quality: Adherent Slough Tendon Exposed: No Muscle Exposed: No Joint Exposed: No Bone Exposed: No Treatment Notes Wound #4 (Right, Dorsal Foot) 1. Cleanse With Wound Cleanser Soap and water 2. Periwound Care Moisturizing lotion TCA Cream 3. Primary Dressing Applied Calcium Alginate 4. Secondary Dressing Dry Gauze 6. Support Layer Applied Kerlix/Coban Notes netting. kerlix and coban applied lightly. Electronic Signature(s) Signed: 10/16/2019 5:14:55 PM By: Cherylin Mylar Entered By: Cherylin Mylar on 10/15/2019 14:06:16 -------------------------------------------------------------------------------- Vitals Details Patient Name: Date of Service: THA Matthew [name], Matthew [name]. 10/15/2019 1:45 PM Medical Record Number: [id number] Patient Account Number: [id number] Date of Birth/Sex: Treating RN: 20-Oct-1950 (69 y.o. Matthew [name] Right Primary Care Juanisha Bautch: Other Clinician: PA TIENT, NO Referring Katalina Magri: Treating Abhiram Criado/Extender: Morrie Sheldon, [place] Weeks in Treatment: 7 Vital Signs Time Taken: 13:55 Temperature (F): 97.5 Height (in): 72 Pulse (bpm): 92 Weight (lbs): 130 Respiratory Rate (breaths/min): 18 Body Mass Index (BMI): 17.6 Blood Pressure (mmHg): 106/65 Reference Range: 80 - 120 mg / dl Electronic Signature(s) Signed: 10/16/2019 5:14:55 PM By: Cherylin Mylar Entered By: Cherylin Mylar on 10/15/2019 13:59:03

## 2019-10-17 NOTE — Progress Notes (Signed)
WENCESLAUS, GIST T ([id number]) . Visit Report for 10/01/2019 Arrival Information Details Patient Name: Date of Service: [name] [facility] T. 10/01/2019 3:45 PM Medical Record Number: [id number] Patient Account Number: [id number] Date of Birth/Sex: Treating RN: 21-Oct-1950 (69 y.o. [name] Primary Care Roxas Clymer: PA TIENT, NO Other Clinician: Referring Rada Zegers: Treating Rahma Meller/Extender: Morrie Sheldon, [place] RO LINE Weeks in Treatment: 5 Visit Information History Since Last Visit Added or deleted any medications: No Patient Arrived: Gilmer Mor Any new allergies or adverse reactions: No Arrival Time: 15:32 Had a fall or experienced change in No Accompanied By: self activities of daily living that may affect Transfer Assistance: None risk of falls: Patient Identification Verified: Yes Signs or symptoms of abuse/neglect since last visito No Secondary Verification Process Completed: Yes Hospitalized since last visit: No Patient Requires Transmission-Based Precautions: No Implantable device outside of the clinic excluding No Patient Has Alerts: No cellular tissue based products placed in the center since last visit: Has Dressing in Place as Prescribed: Yes Pain Present Now: Yes Electronic Signature(s) Signed: 10/17/2019 9:09:16 AM By: Karl Ito Entered By: Karl Ito on 10/01/2019 15:33:15 -------------------------------------------------------------------------------- Clinic Level of Care Assessment Details Patient Name: Date of Service: [name] [facility] T. 10/01/2019 3:45 PM Medical Record Number: [id number] Patient Account Number: [id number] Date of Birth/Sex: Treating RN: 17-Jul-1950 (69 y.o. [name] Primary Care Marium Ragan: PA TIENT, NO Other Clinician: Referring Amadeo Coke: Treating Hyacinth Marcelli/Extender: Morrie Sheldon, [place] RO LINE Weeks in Treatment: 5 Clinic Level of Care Assessment Items TOOL 4 Quantity Score X- 1 0 Use when only  an EandM is performed on FOLLOW-UP visit ASSESSMENTS - Nursing Assessment / Reassessment X- 1 10 Reassessment of Co-morbidities (includes updates in patient status) X- 1 5 Reassessment of Adherence to Treatment Plan ASSESSMENTS - Wound and Skin A ssessment / Reassessment  - 0 Simple Wound Assessment / Reassessment - one wound X- 4 5 Complex Wound Assessment / Reassessment - multiple wounds  - 0 Dermatologic / Skin Assessment (not related to wound area) ASSESSMENTS - Focused Assessment  - 0 Circumferential Edema Measurements - multi extremities  - 0 Nutritional Assessment / Counseling / Intervention X- 1 5 Lower Extremity Assessment (monofilament, tuning fork, pulses)  - 0 Peripheral Arterial Disease Assessment (using hand held doppler) ASSESSMENTS - Ostomy and/or Continence Assessment and Care  - 0 Incontinence Assessment and Management  - 0 Ostomy Care Assessment and Management (repouching, etc.) PROCESS - Coordination of Care X - Simple Patient / Family Education for ongoing care 1 15  - 0 Complex (extensive) Patient / Family Education for ongoing care X- 1 10 Staff obtains Chiropractor, Records, T Results / Process Orders est X- 1 10 Staff telephones HHA, Nursing Homes / Clarify orders / etc  - 0 Routine Transfer to another Facility (non-emergent condition)  - 0 Routine Hospital Admission (non-emergent condition)  - 0 New Admissions / Manufacturing engineer / Ordering NPWT Apligraf, etc. ,  - 0 Emergency Hospital Admission (emergent condition) X- 1 10 Simple Discharge Coordination  - 0 Complex (extensive) Discharge Coordination PROCESS - Special Needs  - 0 Pediatric / Minor Patient Management  - 0 Isolation Patient Management  - 0 Hearing / Language / Visual special needs  - 0 Assessment of Community assistance (transportation, D/C planning, etc.)  - 0 Additional assistance / Altered mentation  - 0 Support Surface(s)  Assessment (bed, cushion, seat, etc.) INTERVENTIONS - Wound Cleansing / Measurement  - 0 Simple Wound Cleansing -  one wound X- 4 5 Complex Wound Cleansing - multiple wounds X- 1 5 Wound Imaging (photographs - any number of wounds) []  - 0 Wound Tracing (instead of photographs) []  - 0 Simple Wound Measurement - one wound X- 4 5 Complex Wound Measurement - multiple wounds INTERVENTIONS - Wound Dressings []  - 0 Small Wound Dressing one or multiple wounds []  - 0 Medium Wound Dressing one or multiple wounds X- 2 20 Large Wound Dressing one or multiple wounds []  - 0 Application of Medications - topical []  - 0 Application of Medications - injection INTERVENTIONS - Miscellaneous []  - 0 External ear exam []  - 0 Specimen Collection (cultures, biopsies, blood, body fluids, etc.) []  - 0 Specimen(s) / Culture(s) sent or taken to Lab for analysis []  - 0 Patient Transfer (multiple staff / Nurse, adultHoyer Lift / Similar devices) []  - 0 Simple Staple / Suture removal (25 or less) []  - 0 Complex Staple / Suture removal (26 or more) []  - 0 Hypo / Hyperglycemic Management (close monitor of Blood Glucose) []  - 0 Ankle / Brachial Index (ABI) - do not check if billed separately X- 1 5 Vital Signs Has the patient been seen at the hospital within the last three years: Yes Total Score: 175 Level Of Care: New/Established - Level 5 Electronic Signature(s) Signed: 10/01/2019 5:33:33 PM By: Zandra AbtsLynch, Shatara RN, BSN Entered By: Zandra AbtsLynch, Shatara on 10/01/2019 17:23:54 -------------------------------------------------------------------------------- Encounter Discharge Information Details Patient Name: Date of Service: THA Jodell CiproXTO N, [name] 10/01/2019 3:45 PM Medical Record Number: [id number] Patient Account Number: [id number] Date of Birth/Sex: Treating RN: [birth year](69 y.o. [name]) Lynch, Shatara Primary Care [place]: PA Zenovia JordanIENT, NO Other Clinician: Referring Braxston Quinter: Treating Lindzee Gouge/Extender: Morrie Sheldonobson,  Michael A BERMA N, [place] RO LINE Weeks in Treatment: 5 Encounter Discharge Information Items Discharge Condition: Stable Ambulatory Status: Cane Discharge Destination: Home Transportation: Private Auto Accompanied By: self Schedule Follow-up Appointment: Yes Clinical Summary of Care: Electronic Signature(s) Signed: 10/01/2019 5:14:33 PM By: Shawn Stalleaton, Bobbi Entered By: Shawn Stalleaton, Bobbi on 10/01/2019 17:14:15 -------------------------------------------------------------------------------- Lower Extremity Assessment Details Patient Name: Date of Service: THA Jodell CiproXTO N, [name] 10/01/2019 3:45 PM Medical Record Number: [id number] Patient Account Number: [id number] Date of Birth/Sex: Treating RN: [birth year] (69 y.o. [name]) Lynch, Shatara Primary Care Jarryn Altland: PA Zenovia JordanIENT, NO Other Clinician: Referring Veida Spira: Treating Daily Crate/Extender: Morrie Sheldonobson, Michael A BERMA N, CA RO LINE Weeks in Treatment: 5 Edema Assessment Assessed: [Left: Yes] [Right: Yes] Edema: [Left: Yes] [Right: Yes] Calf Left: Right: Point of Measurement: 41 cm From Medial Instep 31 cm 35.5 cm Ankle Left: Right: Point of Measurement: 10 cm From Medial Instep 24.5 cm 24.5 cm Electronic Signature(s) Signed: 10/01/2019 5:14:33 PM By: Shawn Stalleaton, Bobbi Signed: 10/01/2019 5:33:33 PM By: Zandra AbtsLynch, Shatara RN, BSN Entered By: Shawn Stalleaton, Bobbi on 10/01/2019 15:57:42 -------------------------------------------------------------------------------- Multi Wound Chart Details Patient Name: Date of Service: THA Jodell CiproXTO N, [name] 10/01/2019 3:45 PM Medical Record Number: [id number] Patient Account Number: [id number] Date of Birth/Sex: Treating RN: [birth year] [age] y.o. [name]) Lynch, Shatara Primary Care Fardowsa Authier: PA Zenovia JordanIENT, NO Other Clinician: Referring Abigial Newville: Treating Jamison Soward/Extender: Morrie Sheldonobson, Michael A BERMA N, [place] RO LINE Weeks in Treatment: 5 Vital Signs Height(in): 72 Pulse(bpm): 70 Weight(lbs): 130 Blood Pressure(mmHg): 129/54 Body Mass  Index(BMI): 18 Temperature(F): 97.5 Respiratory Rate(breaths/min): 18 Photos: [1:No Photos Left, Distal, Anterior Lower Leg] [2:No Photos Left, Lateral Ankle] [3:No Photos Left, Dorsal Foot] Wound Location: [1:Trauma] [2:Blister] [3:Gradually Appeared] Wounding Event: [1:Arterial Insufficiency Ulcer] [2:Arterial Insufficiency Ulcer] [3:Arterial Insufficiency Ulcer] Primary Etiology: [1:Peripheral Arterial Disease] [2:Peripheral Arterial  Disease] [3:Peripheral Arterial Disease] Comorbid History: [1:07/08/2018] [2:08/06/2019] [3:09/17/2019] Date Acquired: [1:5] [2:5] [3:2] Weeks of Treatment: [1:Open] [2:Open] [3:Open] Wound Status: [1:10.5x4.1x0.2] [2:1.5x1.7x0.1] [3:0.3x0.3x0.1] Measurements L x W x D (cm) [1:33.811] [2:2.003] [3:0.071] A (cm) : rea [1:6.762] [2:0.2] [3:0.007] Volume (cm) : [1:-64.00%] [2:-628.40%] [3:63.80%] % Reduction in A rea: [1:-64.00%] [2:-640.70%] [3:65.00%] % Reduction in Volume: [1:Full Thickness Without Exposed] [2:Full Thickness Without Exposed] [3:Full Thickness Without Exposed] Classification: [1:Support Structures Medium] [2:Support Structures Small] [3:Support Structures None Present] Exudate A mount: [1:Serosanguineous] [2:Serosanguineous] [3:N/A] Exudate Type: [1:red, brown] [2:red, brown] [3:N/A] Exudate Color: [1:Well defined, not attached] [2:Distinct, outline attached] [3:Distinct, outline attached] Wound Margin: [1:Small (1-33%)] [2:None Present (0%)] [3:None Present (0%)] Granulation Amount: [1:Pink] [2:N/A] [3:N/A] Granulation Quality: [1:Large (67-100%)] [2:Large (67-100%)] [3:Large (67-100%)] Necrotic Amount: [1:Eschar, Adherent Slough] [2:Eschar, Adherent Slough] [3:Adherent Slough] Necrotic Tissue: [1:Fat Layer (Subcutaneous Tissue)] [2:Fascia: No] [3:Fat Layer (Subcutaneous Tissue)] Exposed Structures: [1:Exposed: Yes Fascia: No Tendon: No Muscle: No Joint: No Bone: No Small (1-33%)] [2:Fat Layer (Subcutaneous Tissue) Exposed: No Tendon: No  Muscle: No Joint: No Bone: No None] [3:Exposed: Yes Fascia: No Tendon: No Muscle: No Joint: No Bone: No None] Wound Number: 4 N/A N/A Photos: No Photos N/A N/A Right, Dorsal Foot N/A N/A Wound Location: Blister N/A N/A Wounding Event: Skin T ear N/A N/A Primary Etiology: Peripheral Arterial Disease N/A N/A Comorbid History: 09/24/2019 N/A N/A Date Acquired: 1 N/A N/A Weeks of Treatment: Open N/A N/A Wound Status: 2.5x2.6x0.1 N/A N/A Measurements L x W x D (cm) 5.105 N/A N/A A (cm) : rea 0.511 N/A N/A Volume (cm) : -4.00% N/A N/A % Reduction in A rea: -4.10% N/A N/A % Reduction in Volume: Partial Thickness N/A N/A Classification: Medium N/A N/A Exudate A mount: Serous N/A N/A Exudate Type: amber N/A N/A Exudate Color: Flat and Intact N/A N/A Wound Margin: Large (67-100%) N/A N/A Granulation Amount: Pink, Pale N/A N/A Granulation Quality: None Present (0%) N/A N/A Necrotic Amount: N/A N/A N/A Necrotic Tissue: Fascia: No N/A N/A Exposed Structures: Fat Layer (Subcutaneous Tissue) Exposed: No Tendon: No Muscle: No Joint: No Bone: No None N/A N/A Epithelialization: Treatment Notes Wound #1 (Left, Distal, Anterior Lower Leg) 1. Cleanse With Wound Cleanser Soap and water 3. Primary Dressing Applied Calcium Alginate Other primary dressing (specifiy in notes) 4. Secondary Dressing ABD Pad Dry Gauze 6. Support Layer Applied Kerlix/Coban Notes medihoney and alginate as primary. netting. kerlix and coban applied lightly. Wound #2 (Left, Lateral Ankle) 1. Cleanse With Wound Cleanser Soap and water 3. Primary Dressing Applied Calcium Alginate Other primary dressing (specifiy in notes) 4. Secondary Dressing ABD Pad Dry Gauze 6. Support Layer Applied Kerlix/Coban Notes medihoney and alginate as primary. netting. kerlix and coban applied lightly. Wound #3 (Left, Dorsal Foot) 1. Cleanse With Wound Cleanser Soap and water 3. Primary Dressing  Applied Calcium Alginate Other primary dressing (specifiy in notes) 4. Secondary Dressing ABD Pad Dry Gauze 6. Support Layer Applied Kerlix/Coban Notes medihoney and alginate as primary. netting. kerlix and coban applied lightly. Wound #4 (Right, Dorsal Foot) 1. Cleanse With Wound Cleanser Soap and water 3. Primary Dressing Applied Calcium Alginate 4. Secondary Dressing Dry Gauze 6. Support Layer Applied Kerlix/Coban Notes netting. kerlix and coban applied lightly. Electronic Signature(s) Signed: 10/01/2019 5:33:33 PM By: Zandra Abts RN, BSN Signed: 10/01/2019 5:40:04 PM By: Baltazar Najjar MD Entered By: Baltazar Najjar on 10/01/2019 17:19:49 -------------------------------------------------------------------------------- Multi-Disciplinary Care Plan Details Patient Name: Date of Service: THA [name], [name] 10/01/2019 3:45 PM Medical Record Number:  [id number] Patient Account Number: [id number] Date of Birth/Sex: Treating RN: 08/04/1950 (69 y.o. [name] Primary Care Shakora Nordquist: PA Haig Prophet, NO Other Clinician: Referring Ekansh Sherk: Treating Soma Lizak/Extender: Lehman Prom, [place] RO LINE Weeks in Treatment: 5 Active Inactive Abuse / Safety / Falls / Self Care Management Nursing Diagnoses: Potential for falls Potential for injury related to falls Goals: Patient will not experience any injury related to falls Date Initiated: 08/27/2019 Date Inactivated: 09/24/2019 Target Resolution Date: 09/28/2019 Goal Status: Unmet Unmet Reason: multiple falls Patient/caregiver will verbalize/demonstrate measures taken to prevent injury and/or falls Date Initiated: 08/27/2019 Target Resolution Date: 10/26/2019 Goal Status: Active Interventions: Assess Activities of Daily Living upon admission and as needed Assess fall risk on admission and as needed Assess: immobility, friction, shearing, incontinence upon admission and as needed Assess impairment of mobility on  admission and as needed per policy Assess personal safety and home safety (as indicated) on admission and as needed Provide education on fall prevention Provide education on personal and home safety Notes: Tissue Oxygenation Nursing Diagnoses: Actual ineffective tissue perfusion; peripheral (select once diagnosis is confirmed) Knowledge deficit related to disease process and management Goals: Patient/caregiver will verbalize understanding of disease process and disease management Date Initiated: 08/27/2019 Target Resolution Date: 10/26/2019 Goal Status: Active Interventions: Assess patient understanding of disease process and management upon diagnosis and as needed Assess peripheral arterial status upon admission and as needed Provide education on tissue oxygenation and ischemia Notes: Wound/Skin Impairment Nursing Diagnoses: Impaired tissue integrity Knowledge deficit related to ulceration/compromised skin integrity Goals: Patient/caregiver will verbalize understanding of skin care regimen Date Initiated: 08/27/2019 Target Resolution Date: 10/26/2019 Goal Status: Active Ulcer/skin breakdown will have a volume reduction of 30% by week 4 Date Initiated: 08/27/2019 Date Inactivated: 09/24/2019 Target Resolution Date: 09/28/2019 Unmet Reason: PAD, non viable Goal Status: Unmet surface Interventions: Assess patient/caregiver ability to obtain necessary supplies Assess patient/caregiver ability to perform ulcer/skin care regimen upon admission and as needed Assess ulceration(s) every visit Provide education on ulcer and skin care Notes: Electronic Signature(s) Signed: 10/01/2019 5:33:33 PM By: Levan Hurst RN, BSN Entered By: Levan Hurst on 10/01/2019 15:29:09 -------------------------------------------------------------------------------- Pain Assessment Details Patient Name: Date of Service: THA Duard Larsen, [name] 10/01/2019 3:45 PM Medical Record Number: [id number] Patient  Account Number: [id number] Date of Birth/Sex: Treating RN: 05-23-1951 (69 y.o. [name] Primary Care Mehkai Gallo: PA Haig Prophet, NO Other Clinician: Referring Aanya Haynes: Treating Sharniece Gibbon/Extender: Lehman Prom, [place] RO LINE Weeks in Treatment: 5 Active Problems Location of Pain Severity and Description of Pain Patient Has Paino Yes Site Locations Rate the pain. Current Pain Level: 8 Pain Management and Medication Current Pain Management: Electronic Signature(s) Signed: 10/01/2019 5:33:33 PM By: Levan Hurst RN, BSN Signed: 10/17/2019 9:09:16 AM By: Sandre Kitty Entered By: Sandre Kitty on 10/01/2019 15:33:40 -------------------------------------------------------------------------------- Patient/Caregiver Education Details Patient Name: Date of Service: THA Duard Larsen, [name] 4/26/2021andnbsp3:45 PM Medical Record Number: [id number] Patient Account Number: [id number] Date of Birth/Gender: Treating RN: [birth year] (69 y.o. [name] Primary Care Physician: PA Haig Prophet, NO Other Clinician: Referring Physician: Treating Physician/Extender: Lehman Prom, [place] RO LINE Weeks in Treatment: 5 Education Assessment Education Provided To: Patient Education Topics Provided Tissue Oxygenation: Methods: Explain/Verbal Responses: State content correctly Wound/Skin Impairment: Methods: Explain/Verbal Responses: State content correctly Electronic Signature(s) Signed: 10/01/2019 5:33:33 PM By: Levan Hurst RN, BSN Entered By: Levan Hurst on 10/01/2019 15:29:26 -------------------------------------------------------------------------------- Wound Assessment Details Patient Name: Date of Service: THA XTO N, [name]  10/01/2019 3:45 PM Medical Record Number: [id number] Patient Account Number: [id number] Date of Birth/Sex: Treating RN: 1951/04/05 (69 y.o. [name] Primary Care Meegan Shanafelt: PA TIENT, NO Other Clinician: Referring  Rhoderick Farrel: Treating Vincent Streater/Extender: Morrie Sheldon, [place] RO LINE Weeks in Treatment: 5 Wound Status Wound Number: 1 Primary Etiology: Arterial Insufficiency Ulcer Wound Location: Left, Distal, Anterior Lower Leg Wound Status: Open Wounding Event: Trauma Comorbid History: Peripheral Arterial Disease Date Acquired: 07/08/2018 Weeks Of Treatment: 5 Clustered Wound: No Photos Photo Uploaded By: Benjaman Kindler on 10/02/2019 14:26:14 Wound Measurements Length: (cm) 10.5 Width: (cm) 4.1 Depth: (cm) 0.2 Area: (cm) 33.811 Volume: (cm) 6.762 % Reduction in Area: -64% % Reduction in Volume: -64% Epithelialization: Small (1-33%) Tunneling: No Undermining: No Wound Description Classification: Full Thickness Without Exposed Support Structures Wound Margin: Well defined, not attached Exudate Amount: Medium Exudate Type: Serosanguineous Exudate Color: red, brown Foul Odor After Cleansing: No Slough/Fibrino Yes Wound Bed Granulation Amount: Small (1-33%) Exposed Structure Granulation Quality: Pink Fascia Exposed: No Necrotic Amount: Large (67-100%) Fat Layer (Subcutaneous Tissue) Exposed: Yes Necrotic Quality: Eschar, Adherent Slough Tendon Exposed: No Muscle Exposed: No Joint Exposed: No Bone Exposed: No Electronic Signature(s) Signed: 10/01/2019 5:14:33 PM By: Shawn Stall Signed: 10/01/2019 5:33:33 PM By: Zandra Abts RN, BSN Entered By: Shawn Stall on 10/01/2019 15:58:06 -------------------------------------------------------------------------------- Wound Assessment Details Patient Name: Date of Service: THA [name], [name] 10/01/2019 3:45 PM Medical Record Number: [id number] Patient Account Number: [id number] Date of Birth/Sex: Treating RN: 06-14-1950 (69 y.o. [name] Primary Care Tarron Krolak: PA TIENT, NO Other Clinician: Referring Jennings Stirling: Treating Ayumi Wangerin/Extender: Morrie Sheldon, [place] RO LINE Weeks in Treatment: 5 Wound  Status Wound Number: 2 Primary Etiology: Arterial Insufficiency Ulcer Wound Location: Left, Lateral Ankle Wound Status: Open Wounding Event: Blister Comorbid History: Peripheral Arterial Disease Date Acquired: 08/06/2019 Weeks Of Treatment: 5 Clustered Wound: No Photos Photo Uploaded By: Benjaman Kindler on 10/02/2019 14:26:32 Wound Measurements Length: (cm) 1.5 Width: (cm) 1.7 Depth: (cm) 0.1 Area: (cm) 2.003 Volume: (cm) 0.2 % Reduction in Area: -628.4% % Reduction in Volume: -640.7% Epithelialization: None Tunneling: No Undermining: No Wound Description Classification: Full Thickness Without Exposed Support Structures Wound Margin: Distinct, outline attached Exudate Amount: Small Exudate Type: Serosanguineous Exudate Color: red, brown Foul Odor After Cleansing: No Slough/Fibrino Yes Wound Bed Granulation Amount: None Present (0%) Exposed Structure Necrotic Amount: Large (67-100%) Fascia Exposed: No Necrotic Quality: Eschar, Adherent Slough Fat Layer (Subcutaneous Tissue) Exposed: No Tendon Exposed: No Muscle Exposed: No Joint Exposed: No Bone Exposed: No Electronic Signature(s) Signed: 10/01/2019 5:14:33 PM By: Shawn Stall Signed: 10/01/2019 5:33:33 PM By: Zandra Abts RN, BSN Entered By: Shawn Stall on 10/01/2019 15:59:14 -------------------------------------------------------------------------------- Wound Assessment Details Patient Name: Date of Service: THA [name], [name] 10/01/2019 3:45 PM Medical Record Number: [id number] Patient Account Number: [id number] Date of Birth/Sex: Treating RN: Jan 10, 1951 (69 y.o. [name] Primary Care Zaiyden Strozier: PA TIENT, NO Other Clinician: Referring Niaya Hickok: Treating Odesser Tourangeau/Extender: Morrie Sheldon, [place] RO LINE Weeks in Treatment: 5 Wound Status Wound Number: 3 Primary Etiology: Arterial Insufficiency Ulcer Wound Location: Left, Dorsal Foot Wound Status: Open Wounding Event: Gradually  Appeared Comorbid History: Peripheral Arterial Disease Date Acquired: 09/17/2019 Weeks Of Treatment: 2 Clustered Wound: No Photos Photo Uploaded By: Benjaman Kindler on 10/02/2019 14:26:52 Wound Measurements Length: (cm) 0.3 Width: (cm) 0.3 Depth: (cm) 0.1 Area: (cm) 0.071 Volume: (cm) 0.007 % Reduction in Area: 63.8% % Reduction in Volume: 65% Epithelialization: None Tunneling: No  Undermining: No Wound Description Classification: Full Thickness Without Exposed Support Structures Wound Margin: Distinct, outline attached Exudate Amount: None Present Foul Odor After Cleansing: No Slough/Fibrino Yes Wound Bed Granulation Amount: None Present (0%) Exposed Structure Necrotic Amount: Large (67-100%) Fascia Exposed: No Necrotic Quality: Adherent Slough Fat Layer (Subcutaneous Tissue) Exposed: Yes Tendon Exposed: No Muscle Exposed: No Joint Exposed: No Bone Exposed: No Electronic Signature(s) Signed: 10/01/2019 5:14:33 PM By: Shawn Stall Signed: 10/01/2019 5:33:33 PM By: Zandra Abts RN, BSN Entered By: Shawn Stall on 10/01/2019 15:58:59 -------------------------------------------------------------------------------- Wound Assessment Details Patient Name: Date of Service: THA [name], [name] 10/01/2019 3:45 PM Medical Record Number: [id number] Patient Account Number: [id number] Date of Birth/Sex: Treating RN: 1950-10-02 (69 y.o. [name] Primary Care Kaniah Rizzolo: PA TIENT, NO Other Clinician: Referring Geran Haithcock: Treating Donya Hitch/Extender: Morrie Sheldon, [place] RO LINE Weeks in Treatment: 5 Wound Status Wound Number: 4 Primary Etiology: Skin Tear Wound Location: Right, Dorsal Foot Wound Status: Open Wounding Event: Blister Comorbid History: Peripheral Arterial Disease Date Acquired: 09/24/2019 Weeks Of Treatment: 1 Clustered Wound: No Photos Photo Uploaded By: Benjaman Kindler on 10/02/2019 14:25:58 Wound Measurements Length: (cm) 2.5 Width:  (cm) 2.6 Depth: (cm) 0.1 Area: (cm) 5.105 Volume: (cm) 0.511 % Reduction in Area: -4% % Reduction in Volume: -4.1% Epithelialization: None Tunneling: No Undermining: No Wound Description Classification: Partial Thickness Wound Margin: Flat and Intact Exudate Amount: Medium Exudate Type: Serous Exudate Color: amber Foul Odor After Cleansing: No Slough/Fibrino No Wound Bed Granulation Amount: Large (67-100%) Exposed Structure Granulation Quality: Pink, Pale Fascia Exposed: No Necrotic Amount: None Present (0%) Fat Layer (Subcutaneous Tissue) Exposed: No Tendon Exposed: No Muscle Exposed: No Joint Exposed: No Bone Exposed: No Electronic Signature(s) Signed: 10/01/2019 5:14:33 PM By: Shawn Stall Signed: 10/01/2019 5:33:33 PM By: Zandra Abts RN, BSN Entered By: Shawn Stall on 10/01/2019 15:59:25 -------------------------------------------------------------------------------- Vitals Details Patient Name: Date of Service: THA [name], [name] 10/01/2019 3:45 PM Medical Record Number: [id number] Patient Account Number: [id number] Date of Birth/Sex: Treating RN: November 29, 1950 (69 y.o. [name] Primary Care Williard Keller: PA TIENT, NO Other Clinician: Referring Kamylah Manzo: Treating Aviv Lengacher/Extender: Morrie Sheldon, [place] RO LINE Weeks in Treatment: 5 Vital Signs Time Taken: 15:33 Temperature (F): 97.5 Height (in): 72 Pulse (bpm): 70 Weight (lbs): 130 Respiratory Rate (breaths/min): 18 Body Mass Index (BMI): 17.6 Blood Pressure (mmHg): 129/54 Reference Range: 80 - 120 mg / dl Electronic Signature(s) Signed: 10/17/2019 9:09:16 AM By: Karl Ito Entered By: Karl Ito on 10/01/2019 15:33:31

## 2019-10-19 ENCOUNTER — Encounter: Payer: Self-pay | Admitting: Vascular Surgery

## 2019-10-19 ENCOUNTER — Ambulatory Visit (INDEPENDENT_AMBULATORY_CARE_PROVIDER_SITE_OTHER): Payer: Medicare HMO | Admitting: Vascular Surgery

## 2019-10-19 ENCOUNTER — Other Ambulatory Visit: Payer: Self-pay

## 2019-10-19 ENCOUNTER — Ambulatory Visit (HOSPITAL_COMMUNITY)
Admission: RE | Admit: 2019-10-19 | Discharge: 2019-10-19 | Disposition: A | Discharge status: Home / Self Care | Payer: Medicare HMO | Source: Ambulatory Visit | Attending: Vascular Surgery | Admitting: Vascular Surgery

## 2019-10-19 VITALS — BP 103/71 | HR 68 | Temp 97.9°F | Resp 20 | Ht 71.0 in | Wt 128.0 lb

## 2019-10-19 DIAGNOSIS — I739 Peripheral vascular disease, unspecified: Secondary | ICD-10-CM

## 2019-10-19 NOTE — Progress Notes (Signed)
Patient ID: Matthew [name], male   DOB: Nov 01, 1950, 69 y.o.   MRN: [id number]  Reason for Consult: Follow-up   Referred by Maxwell Caul, MD  Subjective:     HPI:  Matthew [name] is a 69 y.o. male history of wounds in his bilateral lower extremities for which he is seeing Dr. Leanord Hawking at the wound care center.  Unfortunately the wound on the left leg is failed to heal.  He has dressings in place today would not remove these.  He is a former smoker.  His chief complaint today is actually shortness of breath.  He is taking his aspirin and Plavix and statin drug as previously prescribed.  He walks with the help of a cane.  Past Medical History:  Diagnosis Date  . Anxiety disorder   . Carpal tunnel syndrome    Bilateral  . Cervical spondylosis without myelopathy 05/07/2013  . Foot fracture    right foot  . Gait disorder 05/07/2013  . Hypertension   . Joint pain, knee   . PAD (peripheral artery disease) (HCC)   . Pain in joint, shoulder region   . Post traumatic stress disorder   . Restless leg syndrome   . Spinal stenosis in cervical region   . Spondylosis   . Stroke (HCC)   . Syrinx of spinal cord (HCC) 03/30/2018   Thoracic   Family History  Problem Relation Age of Onset  . Cancer Brother 60       brain tumor  . Cancer - Lung Mother   . Diabetes Father   . Hypertension Other   . Stroke Other   . Diabetes Other    Past Surgical History:  Procedure Laterality Date  . ABDOMINAL AORTAGRAM  08/06/2019   ABDOMINAL AORTOGRAM W/LOWER EXTREMITY  . ABDOMINAL AORTOGRAM W/LOWER EXTREMITY N/A 08/06/2019   Procedure: ABDOMINAL AORTOGRAM W/LOWER EXTREMITY;  Surgeon: Maeola Harman, MD;  Location: [facility] INVASIVE CV LAB;  Service: Cardiovascular;  Laterality: N/A;  . KNEE SURGERY  1980  . NECK SURGERY     2009 or 2010    Short Social History:  Social History   Tobacco Use  . Smoking status: Former Games developer  . Smokeless tobacco: Never Used  . Tobacco comment: quit 8  years  ago  Substance Use Topics  . Alcohol use: No    Alcohol/week: 0.0 standard drinks    Comment: quit drinking 8 years ago    Allergies  Allergen Reactions  . Contrast Media [Iodinated Diagnostic Agents] Hives, Shortness Of Breath and Nausea And Vomiting  . Penicillins Itching    Did it involve swelling of the face/tongue/throat, SOB, or low BP? No Did it involve sudden or severe rash/hives, skin peeling, or any reaction on the inside of your mouth or nose? No Did you need to seek medical attention at a hospital or doctor's office? No When did it last happen?30 years If all above answers are "NO", may proceed with cephalosporin use.  . Codeine Itching    Current Outpatient Medications  Medication Sig Dispense Refill  . aspirin EC 81 MG EC tablet Take 1 tablet (81 mg total) by mouth daily.    . clopidogrel (PLAVIX) 75 MG tablet Take 1 tablet (75 mg total) by mouth daily. 30 tablet 11  . diazepam (VALIUM) 5 MG tablet TAKE 1 TABLET BY MOUTH EVERY NIGHT AT BEDTIME AS NEEDED FOR MUSCLE SPASMS (Patient taking differently: Take 5 mg by mouth at bedtime. NEEDED FOR MUSCLE SPASMS) 90  tablet 1  . pregabalin (LYRICA) 100 MG capsule Take 1 capsule (100 mg total) by mouth daily. (Patient taking differently: Take 100 mg by mouth at bedtime. ) 90 capsule 1  . rosuvastatin (CRESTOR) 10 MG tablet Take 1 tablet (10 mg total) by mouth at bedtime. 30 tablet 11   No current facility-administered medications for this visit.    Review of Systems  Constitutional: Positive for unexpected weight change.  HENT: HENT negative.  Eyes: Eyes negative.  Respiratory: Positive for shortness of breath.  Cardiovascular: Cardiovascular negative.  GI: Gastrointestinal negative.  Musculoskeletal: Musculoskeletal negative.  Skin: Positive for wound.  Neurological: Neurological negative. Hematologic: Hematologic/lymphatic negative.  Psychiatric: Psychiatric negative.        Objective:  Objective    Vitals:   10/19/19 1147  BP: 103/71  Pulse: 68  Resp: 20  Temp: 97.9 F (36.6 C)  SpO2: 96%  Weight: 128 lb (58.1 kg)  Height: 5\' 11"  (1.803 m)   Body mass index is 17.85 kg/m.  Physical Exam HENT:     Head: Normocephalic.     Nose:     Comments: Mask in place Eyes:     Pupils: Pupils are equal, round, and reactive to light.  Cardiovascular:     Rate and Rhythm: Normal rate.     Pulses:          Femoral pulses are 2+ on the right side and 1+ on the left side. Pulmonary:     Effort: Pulmonary effort is normal.  Abdominal:     General: Abdomen is flat.     Palpations: Abdomen is soft. There is no mass.  Musculoskeletal:        General: Normal range of motion.     Comments: Compression dressings in place bilateral lower extremity  Skin:    Capillary Refill: Capillary refill takes more than 3 seconds.  Neurological:     Mental Status: He is alert.  Psychiatric:        Mood and Affect: Mood normal.        Thought Content: Thought content normal.        Judgment: Judgment normal.     Data: I have independently turbid his ABIs on the right to be 1.2 and triphasic on the left that 0.97 and biphasic.  Toe pressure on the left is 0     Assessment/Plan:     69 year old male with history of bilateral lower extremity wounds underwent angiography with stenting of his bilateral common iliac arteries as well as stent of his left SFA just about 2 months ago.  Unfortunately has persistent wounds and toe pressure of 0 on the left with severely delayed capillary refill today.  I have a difficult time palpating a very weak left femoral pulse and I cannot palpate a popliteal pulse to suggest he is having stent issues possibly related to his common femoral access site as well.  He is taking his aspirin Plavix and statin.  I have recommended angiography with possible intervention although this may have to be an antegrade intervention given the position of the previously placed common iliac  artery stents.  At this time he is having issues with shortness of breath and he is concerned to undergo procedure.  We have given him information about obtaining a PCP.  He also had issues with contrast in the past we would need to premedicate him.  He could continue aspirin Plavix throughout the course of the procedure.  He will follow-up in 4  weeks unless he has issues we could schedule him for angiography without seeing him prior if he feels better about proceeding.     Maeola Harman MD [facility]

## 2019-10-22 ENCOUNTER — Encounter (HOSPITAL_BASED_OUTPATIENT_CLINIC_OR_DEPARTMENT_OTHER): Payer: Medicare HMO | Admitting: Internal Medicine

## 2019-10-22 DIAGNOSIS — I70242 Atherosclerosis of native arteries of left leg with ulceration of calf: Secondary | ICD-10-CM | POA: Diagnosis not present

## 2019-10-22 DIAGNOSIS — L97322 Non-pressure chronic ulcer of left ankle with fat layer exposed: Secondary | ICD-10-CM | POA: Diagnosis not present

## 2019-10-22 DIAGNOSIS — L97512 Non-pressure chronic ulcer of other part of right foot with fat layer exposed: Secondary | ICD-10-CM | POA: Diagnosis not present

## 2019-10-22 DIAGNOSIS — I70243 Atherosclerosis of native arteries of left leg with ulceration of ankle: Secondary | ICD-10-CM | POA: Diagnosis not present

## 2019-10-22 DIAGNOSIS — R6889 Other general symptoms and signs: Secondary | ICD-10-CM | POA: Diagnosis not present

## 2019-10-22 DIAGNOSIS — L97828 Non-pressure chronic ulcer of other part of left lower leg with other specified severity: Secondary | ICD-10-CM | POA: Diagnosis not present

## 2019-10-23 NOTE — Progress Notes (Signed)
WILBERN, PENNYPACKER T ([id number]) . Visit Report for 10/22/2019 Arrival Information Details Patient Name: Date of Service: THA [name], GEO [place] T. 10/22/2019 2:15 PM Medical Record Number: [id number] Patient Account Number: [id number] Date of Birth/Sex: Treating RN: 31-Dec-1950 (68 y.o. Judie Petit) [name] Primary Care Artie Takayama: PA Zenovia Jordan, NO Other Clinician: Referring Leeroy Lovings: Treating Lynnann Knudsen/Extender: Morrie Sheldon, [place] RO LINE Weeks in Treatment: 8 Visit Information History Since Last Visit All ordered tests and consults were completed: No Patient Arrived: Ambulatory Added or deleted any medications: No Arrival Time: 14:27 Any new allergies or adverse reactions: No Accompanied By: self Had a fall or experienced change in No Transfer Assistance: None activities of daily living that may affect Patient Identification Verified: Yes risk of falls: Secondary Verification Process Completed: Yes Signs or symptoms of abuse/neglect since last visito No Patient Requires Transmission-Based Precautions: No Hospitalized since last visit: No Patient Has Alerts: No Implantable device outside of the clinic excluding No cellular tissue based products placed in the center since last visit: Has Dressing in Place as Prescribed: Yes Has Compression in Place as Prescribed: Yes Pain Present Now: No Electronic Signature(s) Signed: 10/23/2019 5:13:38 PM By: [name] RN Entered By: [name] on 10/22/2019 14:28:06 -------------------------------------------------------------------------------- Clinic Level of Care Assessment Details Patient Name: Date of Service: THA [name], [name] 10/22/2019 2:15 PM Medical Record Number: [id number] Patient Account Number: [id number] Date of Birth/Sex: Treating RN: March 16, 1951 (68 y.o. Judie Petit) [name] Primary Care Medrith Veillon: PA Zenovia Jordan, NO Other Clinician: Referring Jenilee Franey: Treating Lorella Gomez/Extender: Morrie Sheldon, [place] RO LINE Weeks in  Treatment: 8 Clinic Level of Care Assessment Items TOOL 4 Quantity Score X- 1 0 Use when only an EandM is performed on FOLLOW-UP visit ASSESSMENTS - Nursing Assessment / Reassessment X- 1 10 Reassessment of Co-morbidities (includes updates in patient status) X- 1 5 Reassessment of Adherence to Treatment Plan ASSESSMENTS - Wound and Skin A ssessment / Reassessment []  - 0 Simple Wound Assessment / Reassessment - one wound X- 4 5 Complex Wound Assessment / Reassessment - multiple wounds []  - 0 Dermatologic / Skin Assessment (not related to wound area) ASSESSMENTS - Focused Assessment []  - 0 Circumferential Edema Measurements - multi extremities []  - 0 Nutritional Assessment / Counseling / Intervention []  - 0 Lower Extremity Assessment (monofilament, tuning fork, pulses) []  - 0 Peripheral Arterial Disease Assessment (using hand held doppler) ASSESSMENTS - Ostomy and/or Continence Assessment and Care []  - 0 Incontinence Assessment and Management []  - 0 Ostomy Care Assessment and Management (repouching, etc.) PROCESS - Coordination of Care X - Simple Patient / Family Education for ongoing care 1 15 []  - 0 Complex (extensive) Patient / Family Education for ongoing care X- 1 10 Staff obtains , Records, T Results / Process Orders est []  - 0 Staff telephones HHA, Nursing Homes / Clarify orders / etc []  - 0 Routine Transfer to another Facility (non-emergent condition) []  - 0 Routine Hospital Admission (non-emergent condition) []  - 0 New Admissions / / Ordering NPWT Apligraf, etc. , []  - 0 Emergency Hospital Admission (emergent condition) X- 1 10 Simple Discharge Coordination []  - 0 Complex (extensive) Discharge Coordination PROCESS - Special Needs []  - 0 Pediatric / Minor Patient Management []  - 0 Isolation Patient Management []  - 0 Hearing / Language / Visual special needs []  - 0 Assessment of Community assistance (transportation,  D/C planning, etc.) []  - 0 Additional assistance / Altered mentation []  - 0 Support Surface(s) Assessment (bed,  cushion, seat, etc.) INTERVENTIONS - Wound Cleansing / Measurement []  - 0 Simple Wound Cleansing - one wound X- 4 5 Complex Wound Cleansing - multiple wounds X- 1 5 Wound Imaging (photographs - any number of wounds) []  - 0 Wound Tracing (instead of photographs) []  - 0 Simple Wound Measurement - one wound X- 4 5 Complex Wound Measurement - multiple wounds INTERVENTIONS - Wound Dressings []  - 0 Small Wound Dressing one or multiple wounds []  - 0 Medium Wound Dressing one or multiple wounds X- 2 20 Large Wound Dressing one or multiple wounds []  - 0 Application of Medications - topical []  - 0 Application of Medications - injection INTERVENTIONS - Miscellaneous []  - 0 External ear exam []  - 0 Specimen Collection (cultures, biopsies, blood, body fluids, etc.) []  - 0 Specimen(s) / Culture(s) sent or taken to Lab for analysis []  - 0 Patient Transfer (multiple staff / / Similar devices) []  - 0 Simple Staple / Suture removal (25 or less) []  - 0 Complex Staple / Suture removal (26 or more) []  - 0 Hypo / Hyperglycemic Management (close monitor of Blood Glucose) []  - 0 Ankle / Brachial Index (ABI) - do not check if billed separately X- 1 5 Vital Signs Has the patient been seen at the hospital within the last three years: Yes Total Score: 160 Level Of Care: New/Established - Level 5 Electronic Signature(s) Signed: 10/23/2019 5:13:38 PM By: RN Entered By: on 10/22/2019 14:57:09 -------------------------------------------------------------------------------- Encounter Discharge Information Details Patient Name: Date of Service: THA , [name] 10/22/2019 2:15 PM Medical Record Number: Patient Account Number: Date of Birth/Sex: Treating RN: 1951-01-03 (68 y.o. Nurse, adult) Primary Care Valora Norell: PA  , NO Other Clinician: Referring Lamon Rotundo: Treating Issa Luster/Extender: , [place] RO LINE Weeks in Treatment: 8 Encounter Discharge Information Items Discharge Condition: Stable Ambulatory Status: Ambulatory Discharge Destination: Home Transportation: Private Auto Accompanied By: self Schedule Follow-up Appointment: Yes Clinical Summary of Care: Patient Declined Electronic Signature(s) Signed: 10/23/2019 5:13:38 PM By: 10/25/2019 RN Entered By: [name] on 10/22/2019 14:58:02 -------------------------------------------------------------------------------- Patient/Caregiver Education Details Patient Name: Date of Service: THA 10/24/2019, [name] 5/17/2021andnbsp2:15 PM Medical Record Number: 10/24/2019 Patient Account Number: [id number] Date of Birth/Gender: Treating RN: [birth year] (68 y.o. 10-15-1980) Judie Petit Primary Care Physician: PA [name], NO Other Clinician: Referring Physician: Treating Physician/Extender: Zenovia Jordan, CA RO LINE Weeks in Treatment: 8 Education Assessment Education Provided To: Patient Education Topics Provided Wound/Skin Impairment: Methods: Explain/Verbal Responses: State content correctly Electronic Signature(s) Signed: 10/23/2019 5:13:38 PM By: 10/25/2019 RN Entered By: [name] on 10/22/2019 14:57:27 -------------------------------------------------------------------------------- Wound Assessment Details Patient Name: Date of Service: THA 10/24/2019, [name] 10/22/2019 2:15 PM Medical Record Number: 10/24/2019 Patient Account Number: [id number] Date of Birth/Sex: Treating RN: 1951-03-19 (68 y.o. 10-15-1980) Judie Petit Primary Care Larin Depaoli: PA [name], NO Other Clinician: Referring Kyasia Steuck: Treating Hiya Point/Extender: Zenovia Jordan, [place] RO LINE Weeks in Treatment: 8 Wound Status Wound Number: 1 Primary Etiology: Arterial Insufficiency Ulcer Wound Location: Left, Distal, Anterior Lower Leg Wound  Status: Open Wounding Event: Trauma Comorbid History: Peripheral Arterial Disease Date Acquired: 07/08/2018 Weeks Of Treatment: 8 Clustered Wound: No Wound Measurements Length: (cm) 10.2 Width: (cm) 4.3 Depth: (cm) 0.2 Area: (cm) 34.448 Volume: (cm) 6.89 % Reduction in Area: -67.1% % Reduction in Volume: -67.1% Epithelialization: Small (1-33%) Tunneling: No Undermining: No Wound Description Classification: Full Thickness Without Exposed Support  Structures Wound Margin: Well defined, not attached Exudate Amount: Medium Exudate Type: Purulent Exudate Color: yellow, brown, green Foul Odor After Cleansing: No Slough/Fibrino Yes Wound Bed Granulation Amount: Medium (34-66%) Exposed Structure Granulation Quality: Pink Fascia Exposed: No Necrotic Amount: Medium (34-66%) Fat Layer (Subcutaneous Tissue) Exposed: Yes Necrotic Quality: Adherent Slough Tendon Exposed: No Muscle Exposed: No Joint Exposed: No Bone Exposed: No Treatment Notes Wound #1 (Left, Distal, Anterior Lower Leg) 1. Cleanse With Wound Cleanser Soap and water 2. Periwound Care Barrier cream Moisturizing lotion TCA Cream 3. Primary Dressing Applied Other primary dressing (specifiy in notes) 4. Secondary Dressing ABD Pad Dry Gauze 6. Support Layer Applied Kerlix/Coban Notes primary dressing medihoney with alginate Electronic Signature(s) Signed: 10/23/2019 5:13:38 PM By: Carlene Coria RN Entered By: Carlene Coria on 10/22/2019 14:48:48 -------------------------------------------------------------------------------- Wound Assessment Details Patient Name: Date of Service: THA Duard Larsen, [name] 10/22/2019 2:15 PM Medical Record Number: [id number] Patient Account Number: [id number] Date of Birth/Sex: Treating RN: May 03, 1951 (68 y.o. Jerilynn Mages) Carlene Coria Primary Care Mazikeen Hehn: PA Haig Prophet, NO Other Clinician: Referring Drelyn Pistilli: Treating Jaevin Medearis/Extender: Lehman Prom, [place] RO LINE Weeks in  Treatment: 8 Wound Status Wound Number: 2 Primary Etiology: Arterial Insufficiency Ulcer Wound Location: Left, Lateral Ankle Wound Status: Open Wounding Event: Blister Comorbid History: Peripheral Arterial Disease Date Acquired: 08/06/2019 Weeks Of Treatment: 8 Clustered Wound: No Wound Measurements Length: (cm) 1.8 Width: (cm) 1.6 Depth: (cm) 0.2 Area: (cm) 2.262 Volume: (cm) 0.452 % Reduction in Area: -722.5% % Reduction in Volume: -1574.1% Epithelialization: None Tunneling: No Undermining: No Wound Description Classification: Full Thickness Without Exposed Support Structures Wound Margin: Distinct, outline attached Exudate Amount: Small Exudate Type: Serosanguineous Exudate Color: red, brown Foul Odor After Cleansing: No Slough/Fibrino Yes Wound Bed Granulation Amount: None Present (0%) Exposed Structure Necrotic Amount: Large (67-100%) Fascia Exposed: No Necrotic Quality: Adherent Slough Fat Layer (Subcutaneous Tissue) Exposed: No Tendon Exposed: No Muscle Exposed: No Joint Exposed: No Bone Exposed: No Treatment Notes Wound #2 (Left, Lateral Ankle) 1. Cleanse With Wound Cleanser Soap and water 2. Periwound Care Barrier cream Moisturizing lotion TCA Cream 3. Primary Dressing Applied Other primary dressing (specifiy in notes) 4. Secondary Dressing ABD Pad Dry Gauze 6. Support Layer Applied Kerlix/Coban Notes primary dressing medihoney with alginate Electronic Signature(s) Signed: 10/23/2019 5:13:38 PM By: Carlene Coria RN Entered By: Carlene Coria on 10/22/2019 14:48:58 -------------------------------------------------------------------------------- Wound Assessment Details Patient Name: Date of Service: THA Duard Larsen, [name] 10/22/2019 2:15 PM Medical Record Number: [id number] Patient Account Number: [id number] Date of Birth/Sex: Treating RN: Oct 21, 1950 (68 y.o. Jerilynn Mages) Carlene Coria Primary Care Briawna Carver: PA Haig Prophet, NO Other Clinician: Referring  Akio Hudnall: Treating Brelan Hannen/Extender: Lehman Prom, CA RO LINE Weeks in Treatment: 8 Wound Status Wound Number: 3 Primary Etiology: Arterial Insufficiency Ulcer Wound Location: Left, Dorsal Foot Wound Status: Open Wounding Event: Gradually Appeared Comorbid History: Peripheral Arterial Disease Date Acquired: 09/17/2019 Weeks Of Treatment: 5 Clustered Wound: No Wound Measurements Length: (cm) 0.4 Width: (cm) 0.4 Depth: (cm) 0.1 Area: (cm) 0.126 Volume: (cm) 0.013 % Reduction in Area: 35.7% % Reduction in Volume: 35% Epithelialization: None Tunneling: No Undermining: No Wound Description Classification: Full Thickness Without Exposed Support Structures Wound Margin: Distinct, outline attached Exudate Amount: None Present Foul Odor After Cleansing: No Slough/Fibrino Yes Wound Bed Granulation Amount: None Present (0%) Exposed Structure Necrotic Amount: Large (67-100%) Fascia Exposed: No Necrotic Quality: Adherent Slough Fat Layer (Subcutaneous Tissue) Exposed: Yes Tendon Exposed: No Muscle Exposed: No Joint Exposed: No Bone Exposed: No  Treatment Notes Wound #3 (Left, Dorsal Foot) 1. Cleanse With Wound Cleanser Soap and water 2. Periwound Care Barrier cream Moisturizing lotion TCA Cream 3. Primary Dressing Applied Other primary dressing (specifiy in notes) 4. Secondary Dressing ABD Pad Dry Gauze 6. Support Layer Applied Kerlix/Coban Notes primary dressing medihoney with alginate Electronic Signature(s) Signed: 10/23/2019 5:13:38 PM By: [name] RN Entered By: [name] on 10/22/2019 14:49:11 -------------------------------------------------------------------------------- Wound Assessment Details Patient Name: Date of Service: THA [name], [name] 10/22/2019 2:15 PM Medical Record Number: [id number] Patient Account Number: [id number] Date of Birth/Sex: Treating RN: 07/30/1950 (68 y.o. Judie Petit) [name] Primary Care Kaye Mitro: PA Zenovia Jordan, NO  Other Clinician: Referring Tyronn Golda: Treating Tiea Manninen/Extender: Morrie Sheldon, [place] RO LINE Weeks in Treatment: 8 Wound Status Wound Number: 4 Primary Etiology: Skin Tear Wound Location: Right, Dorsal Foot Wound Status: Open Wounding Event: Blister Comorbid History: Peripheral Arterial Disease Date Acquired: 09/24/2019 Weeks Of Treatment: 4 Clustered Wound: No Wound Measurements Length: (cm) 1.8 Width: (cm) 2 Depth: (cm) 0.1 Area: (cm) 2.827 Volume: (cm) 0.283 % Reduction in Area: 42.4% % Reduction in Volume: 42.4% Epithelialization: None Tunneling: No Undermining: No Wound Description Classification: Full Thickness Without Exposed Support Structures Wound Margin: Flat and Intact Exudate Amount: Medium Exudate Type: Serous Exudate Color: amber Foul Odor After Cleansing: No Slough/Fibrino Yes Wound Bed Granulation Amount: Small (1-33%) Exposed Structure Granulation Quality: Pink Fascia Exposed: No Necrotic Amount: Large (67-100%) Fat Layer (Subcutaneous Tissue) Exposed: No Necrotic Quality: Adherent Slough Tendon Exposed: No Muscle Exposed: No Joint Exposed: No Bone Exposed: No Treatment Notes Wound #4 (Right, Dorsal Foot) 1. Cleanse With Wound Cleanser Soap and water 2. Periwound Care Barrier cream Moisturizing lotion TCA Cream 3. Primary Dressing Applied Calcium Alginate 4. Secondary Dressing Dry Gauze 6. Support Layer Applied Kerlix/Coban Notes netting. kerlix and coban applied lightly. Electronic Signature(s) Signed: 10/23/2019 5:13:38 PM By: [name] RN Entered By: [name] on 10/22/2019 14:49:19 -------------------------------------------------------------------------------- Vitals Details Patient Name: Date of Service: THA [name], [name] 10/22/2019 2:15 PM Medical Record Number: [id number] Patient Account Number: [id number] Date of Birth/Sex: Treating RN: 1950/06/29 (68 y.o. Judie Petit) [name] Primary Care Jerline Linzy: PA  Zenovia Jordan, NO Other Clinician: Referring Juliane Guest: Treating Taneshia Lorence/Extender: Morrie Sheldon, [place] RO LINE Weeks in Treatment: 8 Vital Signs Time Taken: 04:28 Temperature (F): 98.2 Height (in): 72 Pulse (bpm): 86 Weight (lbs): 130 Respiratory Rate (breaths/min): 18 Body Mass Index (BMI): 17.6 Blood Pressure (mmHg): 96/60 Reference Range: 80 - 120 mg / dl Electronic Signature(s) Signed: 10/23/2019 5:13:38 PM By: [name] RN Entered By: [name] on 10/22/2019 14:28:39

## 2019-10-23 NOTE — Progress Notes (Signed)
RAD, GRAMLING T ([id number]) . Visit Report for 10/22/2019 SuperBill Details Patient Name: Date of Service: [name] [facility] T. 10/22/2019 Medical Record Number: [id number] Patient Account Number: [id number] Date of Birth/Sex: Treating RN: 01/03/1951 (68 y.o. 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

## 2019-10-29 ENCOUNTER — Encounter (HOSPITAL_BASED_OUTPATIENT_CLINIC_OR_DEPARTMENT_OTHER): Payer: Medicare HMO | Admitting: Internal Medicine

## 2019-10-29 DIAGNOSIS — R6889 Other general symptoms and signs: Secondary | ICD-10-CM | POA: Diagnosis not present

## 2019-10-29 DIAGNOSIS — L97512 Non-pressure chronic ulcer of other part of right foot with fat layer exposed: Secondary | ICD-10-CM | POA: Diagnosis not present

## 2019-10-29 DIAGNOSIS — I70243 Atherosclerosis of native arteries of left leg with ulceration of ankle: Secondary | ICD-10-CM | POA: Diagnosis not present

## 2019-10-29 DIAGNOSIS — L97518 Non-pressure chronic ulcer of other part of right foot with other specified severity: Secondary | ICD-10-CM | POA: Diagnosis not present

## 2019-10-29 DIAGNOSIS — I70242 Atherosclerosis of native arteries of left leg with ulceration of calf: Secondary | ICD-10-CM | POA: Diagnosis not present

## 2019-10-29 DIAGNOSIS — L97322 Non-pressure chronic ulcer of left ankle with fat layer exposed: Secondary | ICD-10-CM | POA: Diagnosis not present

## 2019-10-29 DIAGNOSIS — L97828 Non-pressure chronic ulcer of other part of left lower leg with other specified severity: Secondary | ICD-10-CM | POA: Diagnosis not present

## 2019-11-01 NOTE — Progress Notes (Signed)
ELIC, VENCILL T ([id number]) . Visit Report for 10/29/2019 Debridement Details Patient Name: Date of Service: THA Matthew [name], GEO [facility] T. 10/29/2019 2:30 PM Medical Record Number: [id number] Patient Account Number: [id number] Date of Birth/Sex: Treating RN: 1950/10/06 (69 y.o. Matthew [name] Primary Care Provider: PA TIENT, NO Other Clinician: Referring Provider: Treating Provider/Extender: Dorothyann Peng, CA RO LINE Weeks in Treatment: 9 Debridement Performed for Assessment: Wound #4 Right,Dorsal Foot Performed By: Physician Cassandria Anger, MD Debridement Type: Debridement Level of Consciousness (Pre-procedure): Awake and Alert Pre-procedure Verification/Time Out Yes - 14:53 Taken: Start Time: 14:53 T Area Debrided (L x W): otal 1.6 (cm) x 1.5 (cm) = 2.4 (cm) Tissue and other material debrided: Non-Viable, Skin: Epidermis Level: Skin/Epidermis Debridement Description: Selective/Open Wound Instrument: Curette Bleeding: Minimum Hemostasis Achieved: Pressure End Time: 14:54 Procedural Pain: 4 Post Procedural Pain: 2 Response to Treatment: Procedure was tolerated well Level of Consciousness (Post- Awake and Alert procedure): Post Debridement Measurements of Total Wound Length: (cm) 1.6 Width: (cm) 1.5 Depth: (cm) 0.1 Volume: (cm) 0.188 Character of Wound/Ulcer Post Debridement: Requires Further Debridement Post Procedure Diagnosis Same as Pre-procedure Electronic Signature(s) Signed: 10/29/2019 5:12:22 PM By: Zandra Abts RN, BSN Signed: 11/01/2019 4:57:23 PM By: Cassandria Anger MD, MBA Entered By: Zandra Abts on 10/29/2019 14:56:39 -------------------------------------------------------------------------------- HPI Details Patient Name: Date of Service: THA Matthew [name], [name] 10/29/2019 2:30 PM Medical Record Number: [id number] Patient Account Number: [id number] Date of Birth/Sex: Treating RN: Aug 13, 1950 (69 y.o. Matthew [name] Primary Care Provider: PA  Zenovia Jordan, NO Other Clinician: Referring Provider: Treating Provider/Extender: Dorothyann Peng, CA RO LINE Weeks in Treatment: 9 History of Present Illness HPI Description: ADMISSION 08/27/2019 This is a 69 year old man who tells me that he fell going up stairs about a year ago resulting in a deep laceration type wound on the left anterior mid tibia. He was soon found to have significant PAD/critical limb ischemia. He had had previous noninvasive studies in 2017 showing an ABI of 0.82 on the right and 0.91 on the left with monophasic to biphasic waveforms. On 07/18/2019 his ABI in the right was 0.58 on the left 0.52 with TBI's of 0.51 on the right and 0 on the left. He underwent an angiogram by Dr. Randie Heinz on 3/1. He underwent bilateral iliac artery stents he was also discovered to have a left-sided SFA with it was patent up to 10 centimeters proximal to the adductor canal but then became stenotic with a 95% stenosis for 15 cm. Dominant runoff is via the posterior tibial artery. The patient still has the wound on the left anterior mid tibia. He has been using Vaseline to this. He wraps this and gauze and some form of Ace wrap. He complains bitterly of the pain also complains about falling with the leg giving out on him. About 2 weeks ago he developed a new wound on the left lateral ankle. He had a DVT rule out study on 3/16/ 1 that was negative for a DVT Past medical history includes cervical spondylosis, left carotid bruit, marked PAD. ABI in our clinic was 0.71 on the left 3/29; patient admitted to the clinic last week with a laceration type injury on the left anterior mid tibia in the setting of severe PAD. He was supposed to follow- up with Dr. Randie Heinz last week but he tells me he had to cancel the appointment because of nausea and vomiting. I have encouraged him to rebook this ASAP. He tolerated our kerlix Coban  wrap without incidence that the pain was better. We used Iodoflex to the wound  area 4/5; working on getting him back in with Dr. Randie Heinz. We have been using Iodoflex under Curlex and Coban. He seems to be tolerating this 4/12; still complaining of a lot of pain. Wound itself on the left anterior mid tibia looks about the same perhaps somewhat better looking tissue. he has a new area on the left medial lower leg and ankle. He has previously had bilateral external iliac artery stents. Still complaining of a lot of pain 4/16; still complaining of a lot of pain. He has noninvasive studies on Friday morning and sees Dr. Randie Heinz after this. Large wound on the left anterior tibia. Smaller areas on the left lateral lower leg and ankle. He arrives in clinic today with blisters on his dorsal right leg is swollen 4/26; still complaining of a lot of pain I changed him to [facility] from Iodoflex last week but that does not seem to have helped. He has smaller areas on the left lateral lower leg and ankle which also look ischemic. We sent him to see Dr. Randie Heinz unfortunately his kerlix and Coban wrap was not removed. They were able to document that his bilateral common iliac artery stents and left SFA stents were patent based on duplex. However they did not look at the tibial vessels toe brachial pressures etc. I am not sure whether his original angiograms looked at these in any detail. 5/3; the patient has a substantial wound on the left anterior mid tibia smaller wound on the left lateral ankle both of these very painful. He also has an area on the right dorsal foot. I did speak with Dr. Randie Heinz last week they are trying to get him into the office to look at his arterial status especially in the left lower leg. The stents previously placed in the iliacs bilaterally and the left SFA were patent. We are using Hydrofera Blue 5/10; after I spoke with Dr. Randie Heinz. Secure text message apparently his office call the patient 2 weeks ago but he has not heard anything since. I was hoping that they would do  noninvasive studies on the left lower leg. Unfortunately there is absolutely no change in this wound considerably long traumatic wound on the anterior tibia and the area on the lateral lower ankle. We have been using Medihoney under calcium alginate. He also has an area on the right dorsal foot. We are using silver alginate here. 10/29/19-Patient is back at 2 weeks, is seeing Dr. Pascal Lux who is proposing angiography and antegrade revascularization on the left given that there are stents in this limb, patient is a lot of pain on the wound on the left anterior leg, we are using Medihoney, the right dorsal wound also looks about the same Electronic Signature(s) Signed: 10/29/2019 2:58:42 PM By: Cassandria Anger MD, MBA Entered By: Cassandria Anger on 10/29/2019 14:58:42 -------------------------------------------------------------------------------- Physical Exam Details Patient Name: Date of Service: THA Matthew [name], [name] 10/29/2019 2:30 PM Medical Record Number: [id number] Patient Account Number: [id number] Date of Birth/Sex: Treating RN: 09/12/1950 (69 y.o. Matthew [name] Primary Care Provider: PA Zenovia Jordan, NO Other Clinician: Referring Provider: Treating Provider/Extender: Dorothyann Peng, CA RO LINE Weeks in Treatment: 9 Constitutional alert and oriented x 3. sitting or standing blood pressure is within target range for patient.. supine blood pressure is within target range for patient.. pulse regular and within target range for patient.Marland Kitchen respirations regular, non-labored and within target  range for patient.Marland Kitchen temperature within target range for patient.. . . Well- nourished and well-hydrated in no acute distress. Notes Left lateral leg wound is very tender to touch so there is layer of slough the wound base that cannot be removed The right dorsal foot wound also has some slough that was removed using curette patient had significant degree of pain was not able to tolerate this  much Electronic Signature(s) Signed: 10/29/2019 2:59:25 PM By: Tobi Bastos MD, MBA Entered By: Tobi Bastos on 10/29/2019 14:59:24 -------------------------------------------------------------------------------- Physician Orders Details Patient Name: Date of Service: THA Duard Larsen, [name] 10/29/2019 2:30 PM Medical Record Number: [id number] Patient Account Number: [id number] Date of Birth/Sex: Treating RN: [birth year] (69 y.o. 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 Nurse Visit: - 1 week fo rewrap Dressing Change Frequency Do not change entire dressing for one week. - all wounds Skin Barriers/Peri-Wound Care Barrier cream Moisturizing lotion TCA Cream or Ointment - mix with lotion Wound Cleansing May shower with protection. - use cast protector Primary Wound Dressing Wound #1 Left,Distal,Anterior Lower Leg Medihoney Alginate Wound #2 Left,Lateral Ankle Medihoney Alginate Wound #3 Left,Dorsal Foot Medihoney Alginate Wound #4 Right,Dorsal Foot Medihoney Alginate Secondary Dressing Wound #1 Left,Distal,Anterior Lower Leg Dry Gauze ABD pad Wound #2 Left,Lateral Ankle Dry Gauze ABD pad Wound #3 Left,Dorsal Foot Dry Gauze ABD pad Wound #4 Right,Dorsal Foot Dry Gauze Edema  Control Kerlix and Coban - Bilateral - ****LIGHTLY WRAP**** Avoid standing for long periods of time Elevate legs to the level of the heart or above for 30 minutes daily and/or when sitting, a frequency of: - throughout the day Electronic Signature(s) Signed: 10/29/2019 5:12:22 PM By: Levan Hurst RN, BSN Signed: 11/01/2019 4:57:23 PM By: Tobi Bastos MD, MBA Entered By: Levan Hurst on 10/29/2019 14:57:57 -------------------------------------------------------------------------------- Problem List Details Patient Name: Date of Service: THA Duard Larsen, [name] 10/29/2019 2:30 PM Medical Record Number: [id number] Patient Account Number: [id number] Date of Birth/Sex: Treating RN: 09-14-1950 (69 y.o. 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 08/27/2019 No Yes I70.243 Atherosclerosis of native arteries of left leg with ulceration of ankle 08/27/2019 No Yes L97.828 Non-pressure chronic ulcer of other part of left lower leg with other specified 08/27/2019 No Yes severity L97.322 Non-pressure chronic ulcer of left ankle with fat layer exposed 08/27/2019 No Yes L97.518 Non-pressure chronic ulcer of other part of right foot with other specified 10/08/2019 No Yes severity Inactive Problems Resolved Problems Electronic Signature(s) Signed: 10/29/2019 5:12:22 PM By: Levan Hurst RN, BSN Signed: 11/01/2019 4:57:23 PM By: Tobi Bastos MD, MBA Entered By: Levan Hurst on 10/29/2019 14:54:55 -------------------------------------------------------------------------------- Progress Note Details Patient Name: Date of Service: [place],  [name] 10/29/2019 2:30 PM Medical Record Number:  [id number] Patient Account Number: [id number] Date of Birth/Sex: Treating RN: 19-Mar-1951 (69 y.o. Matthew [name] Primary Care Provider: PA Zenovia Jordan, NO Other Clinician: Referring Provider: Treating Provider/Extender: Dorothyann Peng, CA RO LINE Weeks in Treatment: 9 Subjective History of Present Illness (HPI) ADMISSION 08/27/2019 This is a 69 year old man who tells me that he fell going up stairs about a year ago resulting in a deep laceration type wound on the left anterior mid tibia. He was soon found to have significant PAD/critical limb ischemia. He had had previous noninvasive studies in 2017 showing an ABI of 0.82 on the right and 0.91 on the left with monophasic to biphasic waveforms. On 07/18/2019 his ABI in the right was 0.58 on the left 0.52 with TBI's of 0.51 on the right and 0 on the left. He underwent an angiogram by Dr. Randie Heinz on 3/1. He underwent bilateral iliac artery stents he was also discovered to have a left-sided SFA with it was patent up to 10 centimeters proximal to the adductor canal but then became stenotic with a 95% stenosis for 15 cm. Dominant runoff is via the posterior tibial artery. The patient still has the wound on the left anterior mid tibia. He has been using Vaseline to this. He wraps this and gauze and some form of Ace wrap. He complains bitterly of the pain also complains about falling with the leg giving out on him. About 2 weeks ago he developed a new wound on the left lateral ankle. He had a DVT rule out study on 3/16/ 1 that was negative for a DVT Past medical history includes cervical spondylosis, left carotid bruit, marked PAD. ABI in our clinic was 0.71 on the left 3/29; patient admitted to the clinic last week with a laceration type injury on the left anterior mid tibia in the setting of severe PAD. He was supposed to follow- up with Dr. Randie Heinz last week but he tells me he had to cancel the appointment because of nausea and vomiting. I have  encouraged him to rebook this ASAP. He tolerated our kerlix Coban wrap without incidence that the pain was better. We used Iodoflex to the wound area 4/5; working on getting him back in with Dr. Randie Heinz. We have been using Iodoflex under Curlex and Coban. He seems to be tolerating this 4/12; still complaining of a lot of pain. Wound itself on the left anterior mid tibia looks about the same perhaps somewhat better looking tissue. he has a new area on the left medial lower leg and ankle. He has previously had bilateral external iliac artery stents. Still complaining of a lot of pain 4/16; still complaining of a lot of pain. He has noninvasive studies on Friday morning and sees Dr. Randie Heinz after this. Large wound on the left anterior tibia. Smaller areas on the left lateral lower leg and ankle. He arrives in clinic today with blisters on his dorsal right leg is swollen 4/26; still complaining of a lot of pain I changed him to [facility] from Iodoflex last week but that does not seem to have helped. He has smaller areas on the left lateral lower leg and ankle which also look ischemic. We sent him to see Dr. Randie Heinz unfortunately his kerlix and Coban wrap was not removed. They were able to document that his bilateral common iliac artery stents and left SFA stents were patent based on duplex. However they did not look at the  tibial vessels toe brachial pressures etc. I am not sure whether his original angiograms looked at these in any detail. 5/3; the patient has a substantial wound on the left anterior mid tibia smaller wound on the left lateral ankle both of these very painful. He also has an area on the right dorsal foot. I did speak with Dr. Randie Heinz last week they are trying to get him into the office to look at his arterial status especially in the left lower leg. The stents previously placed in the iliacs bilaterally and the left SFA were patent. We are using Hydrofera Blue 5/10; after I spoke with Dr. Randie Heinz.  Secure text message apparently his office call the patient 2 weeks ago but he has not heard anything since. I was hoping that they would do noninvasive studies on the left lower leg. Unfortunately there is absolutely no change in this wound considerably long traumatic wound on the anterior tibia and the area on the lateral lower ankle. We have been using Medihoney under calcium alginate. He also has an area on the right dorsal foot. We are using silver alginate here. 10/29/19-Patient is back at 2 weeks, is seeing Dr. Pascal Lux who is proposing angiography and antegrade revascularization on the left given that there are stents in this limb, patient is a lot of pain on the wound on the left anterior leg, we are using Medihoney, the right dorsal wound also looks about the same Objective Constitutional alert and oriented x 3. sitting or standing blood pressure is within target range for patient.. supine blood pressure is within target range for patient.. pulse regular and within target range for patient.Marland Kitchen respirations regular, non-labored and within target range for patient.Marland Kitchen temperature within target range for patient.. Well- nourished and well-hydrated in no acute distress. Vitals Time Taken: 2:13 PM, Height: 72 in, Weight: 130 lbs, BMI: 17.6, Temperature: 97.7 F, Pulse: 75 bpm, Respiratory Rate: 18 breaths/min, Blood Pressure: 134/69 mmHg. General Notes: Left lateral leg wound is very tender to touch so there is layer of slough the wound base that cannot be removed The right dorsal foot wound also has some slough that was removed using curette patient had significant degree of pain was not able to tolerate this much Integumentary (Hair, Skin) Wound #1 status is Open. Original cause of wound was Trauma. The wound is located on the [facility] Lower Leg. The wound measures 10.1cm length x 4cm width x 0.2cm depth; 31.73cm^2 area and 6.346cm^3 volume. There is Fat Layer (Subcutaneous Tissue) Exposed  exposed. There is no tunneling or undermining noted. There is a medium amount of purulent drainage noted. The wound margin is well defined and not attached to the wound base. There is medium (34-66%) pink granulation within the wound bed. There is a medium (34-66%) amount of necrotic tissue within the wound bed including Adherent Slough. Wound #2 status is Open. Original cause of wound was Blister. The wound is located on the Left,Lateral Ankle. The wound measures 1.6cm length x 1.6cm width x 0.2cm depth; 2.011cm^2 area and 0.402cm^3 volume. There is no tunneling or undermining noted. There is a small amount of serosanguineous drainage noted. The wound margin is distinct with the outline attached to the wound base. There is no granulation within the wound bed. There is a large (67-100%) amount of necrotic tissue within the wound bed including Adherent Slough. Wound #3 status is Open. Original cause of wound was Gradually Appeared. The wound is located on the Left,Dorsal Foot. The wound measures 0.3cm length  x 0.2cm width x 0.1cm depth; 0.047cm^2 area and 0.005cm^3 volume. There is Fat Layer (Subcutaneous Tissue) Exposed exposed. There is no tunneling or undermining noted. There is a small amount of serous drainage noted. The wound margin is distinct with the outline attached to the wound base. There is small (1-33%) pink granulation within the wound bed. There is a large (67-100%) amount of necrotic tissue within the wound bed including Adherent Slough. Wound #4 status is Open. Original cause of wound was Blister. The wound is located on the Right,Dorsal Foot. The wound measures 1.6cm length x 1.5cm width x 0.1cm depth; 1.885cm^2 area and 0.188cm^3 volume. There is Fat Layer (Subcutaneous Tissue) Exposed exposed. There is no tunneling or undermining noted. There is a medium amount of serous drainage noted. The wound margin is flat and intact. There is small (1-33%) pink granulation within the wound  bed. There is a large (67-100%) amount of necrotic tissue within the wound bed including Adherent Slough. 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 There was a Selective/Open Wound Skin/Epidermis Debridement with a ear total area of 2.4 sq cm performed by Cassandria Anger, MD. With the following instrument(s): Curette to remove Non-Viable tissue/material. Material removed includes Skin: Epidermis. No specimens were taken. A time out was conducted at 14:53, prior to the start of the procedure. A Minimum amount of bleeding was controlled with Pressure. The procedure was tolerated well with a pain level of 4 throughout and a pain level of 2 following the procedure. Post Debridement Measurements: 1.6cm length x 1.5cm width x 0.1cm depth; 0.188cm^3 volume. Character of Wound/Ulcer Post Debridement requires further debridement. Post procedure Diagnosis Wound #4: Same as Pre-Procedure Plan Follow-up Appointments: Return Appointment in 2 weeks. Nurse Visit: - 1 week fo rewrap Dressing Change Frequency: Do not change entire dressing for one week. - all wounds Skin Barriers/Peri-Wound Care: Barrier cream Moisturizing lotion TCA Cream or Ointment - mix with lotion Wound Cleansing: May shower with protection. - use cast protector Primary Wound Dressing: Wound #1 Left,Distal,Anterior Lower Leg: Medihoney Alginate Wound #2 Left,Lateral Ankle: Medihoney Alginate Wound #3 Left,Dorsal Foot: Medihoney Alginate Wound #4  Right,Dorsal Foot: Medihoney Alginate Secondary Dressing: Wound #1 Left,Distal,Anterior Lower Leg: Dry Gauze ABD pad Wound #2 Left,Lateral Ankle: Dry Gauze ABD pad Wound #3 Left,Dorsal Foot: Dry Gauze ABD pad Wound #4 Right,Dorsal Foot: Dry Gauze Edema Control: Kerlix and Coban - Bilateral - ****LIGHTLY WRAP**** Avoid standing for long periods of time Elevate legs to the level of the heart or above for 30 minutes daily and/or when sitting, a frequency of: - throughout the day 1. Continue with Medihoney to the left lateral leg wound would also use this for the right dorsal foot wound 2. Patient to follow-up with vascular regarding neck steps to improve circulation to the left leg 3. Return to clinic next week Electronic Signature(s) Signed: 10/29/2019 3:00:05 PM By: Cassandria Anger MD, MBA Entered By: Cassandria Anger on 10/29/2019 15:00:04 -------------------------------------------------------------------------------- SuperBill Details Patient Name: Date of Service: THA Matthew [name], [name] 10/29/2019 Medical Record Number: [id number] Patient Account Number: [id number]  Date of Birth/Sex: Treating RN: 01/21/1951 [age] y.o. 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 I70.243 Atherosclerosis of native arteries of left leg with ulceration of ankle L97.828 Non-pressure chronic ulcer of other part of left lower leg with other specified severity L97.322 Non-pressure chronic ulcer of left ankle with fat layer exposed L97.518 Non-pressure chronic ulcer of other part of right foot with other specified severity Facility Procedures CPT4 Code: [id number] Description: [id number] - DEBRIDE WOUND 1ST 20 SQ CM OR < ICD-10 Diagnosis Description  L97.518 Non-pressure chronic ulcer of other part of right foot with other specified sever Modifier: ity Quantity: 1 Physician Procedures : CPT4 Code Description Modifier [id number] - WC PHYS DEBR WO ANESTH 20 SQ CM ICD-10 Diagnosis Description L97.518 Non-pressure chronic ulcer of other part of right foot with other specified severity Quantity: 1 Electronic Signature(s) Signed: 10/29/2019 3:00:21 PM By: Cassandria AngerMadduri, Franchon Ketterman MD, MBA Entered By: Cassandria AngerMadduri, Noel Rodier on 10/29/2019 15:00:20

## 2019-11-06 ENCOUNTER — Encounter (HOSPITAL_BASED_OUTPATIENT_CLINIC_OR_DEPARTMENT_OTHER): Payer: Medicare HMO | Attending: Internal Medicine | Admitting: Internal Medicine

## 2019-11-06 DIAGNOSIS — I70243 Atherosclerosis of native arteries of left leg with ulceration of ankle: Secondary | ICD-10-CM | POA: Insufficient documentation

## 2019-11-06 DIAGNOSIS — L97512 Non-pressure chronic ulcer of other part of right foot with fat layer exposed: Secondary | ICD-10-CM | POA: Insufficient documentation

## 2019-11-06 DIAGNOSIS — L97822 Non-pressure chronic ulcer of other part of left lower leg with fat layer exposed: Secondary | ICD-10-CM | POA: Insufficient documentation

## 2019-11-06 DIAGNOSIS — I70248 Atherosclerosis of native arteries of left leg with ulceration of other part of lower left leg: Secondary | ICD-10-CM | POA: Diagnosis not present

## 2019-11-06 DIAGNOSIS — L97322 Non-pressure chronic ulcer of left ankle with fat layer exposed: Secondary | ICD-10-CM | POA: Insufficient documentation

## 2019-11-06 DIAGNOSIS — R6889 Other general symptoms and signs: Secondary | ICD-10-CM | POA: Diagnosis not present

## 2019-11-06 NOTE — Progress Notes (Signed)
ABHIJAY, MORRISS T ([id number]) . Visit Report for 11/06/2019 SuperBill Details Patient Name: Date of Service: THA [name], GEO [facility] T. 11/06/2019 Medical Record Number: [id number] Patient Account Number: [id number] Date of Birth/Sex: Treating RN: 08-28-1950 (69 y.o. 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

## 2019-11-06 NOTE — Progress Notes (Signed)
Matthew [name], Matthew [name] ([id number]) . Visit Report for 11/06/2019 Arrival Information Details Patient Name: Date of Service: THA Matthew [name], Matthew [name]. 11/06/2019 1:00 PM Medical Record Number: [id number] Patient Account Number: [id number] Date of Birth/Sex: Treating RN: [birth year] (69 y.o. Matthew [name] Primary Care Debie Ashline: PA TIENT, NO Other Clinician: Referring Tanecia Mccay: Treating Gill Delrossi/Extender: Morrie Sheldon, [place] RO LINE Weeks in Treatment: 10 Visit Information History Since Last Visit Added or deleted any medications: No Patient Arrived: Cane Any new allergies or adverse reactions: No Arrival Time: 13:06 Had a fall or experienced change in No Accompanied By: self activities of daily living that may affect Transfer Assistance: None risk of falls: Patient Identification Verified: Yes Signs or symptoms of abuse/neglect since No Secondary Verification Process Completed: Yes last visito Patient Requires Transmission-Based Precautions: No Hospitalized since last visit: No Patient Has Alerts: No Implantable device outside of the clinic No excluding cellular tissue based products placed in the center since last visit: Has Dressing in Place as Prescribed: Yes Has Compression in Place as Prescribed: Yes Has Footwear/Offloading in Place as Yes Prescribed: Left: Surgical Shoe with Pressure Relief Insole Right: Surgical Shoe with Pressure Relief Insole Pain Present Now: No Electronic Signature(s) Signed: 11/06/2019 5:56:12 PM By: Shawn Stall Entered By: Shawn Stall on 11/06/2019 14:42:34 -------------------------------------------------------------------------------- Clinic Level of Care Assessment Details Patient Name: Date of Service: THA Matthew [name], Matthew [name]. 11/06/2019 1:00 PM Medical Record Number: [id number] Patient Account Number: [id number] Date of Birth/Sex: Treating RN: 1950/07/17 (69 y.o. Matthew [name] Primary Care Matthew [name]: PA Zenovia Jordan, NO Other  Clinician: Referring Matthew [name]: Treating Matthew [name]/Extender: Morrie Sheldon, [place] RO LINE Weeks in Treatment: 10 Clinic Level of Care Assessment Items TOOL 4 Quantity Score X- 1 0 Use when only an EandM is performed on FOLLOW-UP visit ASSESSMENTS - Nursing Assessment / Reassessment X- 1 10 Reassessment of Co-morbidities (includes updates in patient status) X- 1 5 Reassessment of Adherence to Treatment Plan ASSESSMENTS - Wound and Skin A ssessment / Reassessment []  - 0 Simple Wound Assessment / Reassessment - one wound X- 4 5 Complex Wound Assessment / Reassessment - multiple wounds X- 1 10 Dermatologic / Skin Assessment (not related to wound area) ASSESSMENTS - Focused Assessment []  - 0 Circumferential Edema Measurements - multi extremities X- 1 10 Nutritional Assessment / Counseling / Intervention []  - 0 Lower Extremity Assessment (monofilament, tuning fork, pulses) []  - 0 Peripheral Arterial Disease Assessment (using hand held doppler) ASSESSMENTS - Ostomy and/or Continence Assessment and Care []  - 0 Incontinence Assessment and Management []  - 0 Ostomy Care Assessment and Management (repouching, etc.) PROCESS - Coordination of Care []  - 0 Simple Patient / Family Education for ongoing care X- 1 20 Complex (extensive) Patient / Family Education for ongoing care X- 1 10 Staff obtains , Records, [name] Results / Process Orders est []  - 0 Staff telephones HHA, Nursing Homes / Clarify orders / etc []  - 0 Routine Transfer to another Facility (non-emergent condition) []  - 0 Routine Hospital Admission (non-emergent condition) []  - 0 New Admissions / / Ordering NPWT Apligraf, etc. , []  - 0 Emergency Hospital Admission (emergent condition) []  - 0 Simple Discharge Coordination X- 1 15 Complex (extensive) Discharge Coordination PROCESS - Special Needs []  - 0 Pediatric / Minor Patient Management []  - 0 Isolation Patient  Management []  - 0 Hearing / Language / Visual special needs []  - 0 Assessment of Community assistance (transportation, D/C planning, etc.) []  - 0 Additional  assistance / Altered mentation []  - 0 Support Surface(s) Assessment (bed, cushion, seat, etc.) INTERVENTIONS - Wound Cleansing / Measurement []  - 0 Simple Wound Cleansing - one wound X- 4 5 Complex Wound Cleansing - multiple wounds X- 1 5 Wound Imaging (photographs - any number of wounds) []  - 0 Wound Tracing (instead of photographs) []  - 0 Simple Wound Measurement - one wound X- 4 5 Complex Wound Measurement - multiple wounds INTERVENTIONS - Wound Dressings []  - 0 Small Wound Dressing one or multiple wounds []  - 0 Medium Wound Dressing one or multiple wounds X- 2 20 Large Wound Dressing one or multiple wounds X- 1 5 Application of Medications - topical []  - 0 Application of Medications - injection INTERVENTIONS - Miscellaneous []  - 0 External ear exam []  - 0 Specimen Collection (cultures, biopsies, blood, body fluids, etc.) []  - 0 Specimen(s) / Culture(s) sent or taken to Lab for analysis []  - 0 Patient Transfer (multiple staff / / Similar devices) []  - 0 Simple Staple / Suture removal (25 or less) []  - 0 Complex Staple / Suture removal (26 or more) []  - 0 Hypo / Hyperglycemic Management (close monitor of Blood Glucose) []  - 0 Ankle / Brachial Index (ABI) - do not check if billed separately X- 1 5 Vital Signs Has the patient been seen at the hospital within the last three years: Yes Total Score: 195 Level Of Care: New/Established - Level 5 Electronic Signature(s) Signed: 11/06/2019 5:56:12 PM By: Entered By: on 11/06/2019 14:45:32 -------------------------------------------------------------------------------- Encounter Discharge Information Details Patient Name: Date of Service: THA , Matthew [name]. 11/06/2019 1:00 PM Medical Record Number: Patient  Account Number: Date of Birth/Sex: Treating RN: 07/30/1950 (69 y.o. Nurse, adult Primary Care Matthew [name]: PA , NO Other Clinician: Referring Lori-Ann Lindfors: Treating Matthew [name]/Extender: , CA RO LINE Weeks in Treatment: 10 Encounter Discharge Information Items Discharge Condition: Stable Ambulatory Status: Cane Discharge Destination: Home Transportation: Private Auto Accompanied By: self Schedule Follow-up Appointment: Yes Clinical Summary of Care: Electronic Signature(s) Signed: 11/06/2019 5:56:12 PM By: Entered By: 01/06/2020 on 11/06/2019 14:45:02 -------------------------------------------------------------------------------- Patient/Caregiver Education Details Patient Name: Date of Service: THA Shawn Stall, Matthew [name]. 6/1/2021andnbsp1:00 PM Medical Record Number: Matthew [name] Patient Account Number: 01/06/2020 Date of Birth/Gender: Treating RN: 03-10-1951 (69 y.o. 01/25/1951 Primary Care Physician: PA (77, NO Other Clinician: Referring Physician: Treating Physician/Extender: Matthew [name], CA RO LINE Weeks in Treatment: 10 Education Assessment Education Provided To: Patient Education Topics Provided Wound/Skin Impairment: Handouts: Skin Care Do's and Dont's Methods: Explain/Verbal Responses: Reinforcements needed Electronic Signature(s) Signed: 11/06/2019 5:56:12 PM By: Morrie Sheldon Entered By: 01/06/2020 on 11/06/2019 14:44:54 -------------------------------------------------------------------------------- Wound Assessment Details Patient Name: Date of Service: THA Shawn Stall, Matthew [name]. 11/06/2019 1:00 PM Medical Record Number: Matthew [name] Patient Account Number: 01/06/2020 Date of Birth/Sex: Treating RN: 1950-07-17 (69 y.o. 01/25/1951 Primary Care Noell Lorensen: PA TIENT, NO Other Clinician: Referring Moncerrath Berhe: Treating Chase Knebel/Extender: (75, [place] RO LINE Weeks in Treatment:  10 Wound Status Wound Number: 1 Primary Etiology: Arterial Insufficiency Ulcer Wound Location: Left, Distal, Anterior Lower Leg Wound Status: Open Wounding Event: Trauma Date Acquired: 07/08/2018 Weeks Of Treatment: 10 Clustered Wound: No Wound Measurements Length: (cm) 10.1 Width: (cm) 4 Depth: (cm) 0.2 Area: (cm) 31.73 Volume: (cm) 6.346 % Reduction in Area: -53.9% % Reduction in Volume: -53.9% Wound Description Classification: Full Thickness Without Exposed Support Structur  es Treatment Notes Wound #1 (Left, Distal, Anterior Lower Leg) 1. Cleanse With Wound Cleanser Soap and water 2. Periwound Care Moisturizing lotion 3. Primary Dressing Applied Calcium Alginate Other primary dressing (specifiy in notes) 4. Secondary Dressing Dry Gauze 6. Support Layer Applied Kerlix/Coban 7. Footwear/Offloading device applied Surgical shoe Notes primary dressing medihoney alginate. netting. Electronic Signature(s) Signed: 11/06/2019 5:56:12 PM By: Shawn Stall Entered By: Shawn Stall on 11/06/2019 14:43:07 -------------------------------------------------------------------------------- Wound Assessment Details Patient Name: Date of Service: THA Matthew [name], Matthew [name]. 11/06/2019 1:00 PM Medical Record Number: [id number] Patient Account Number: [id number] Date of Birth/Sex: Treating RN: [birth year] (69 y.o. Matthew [name] Primary Care Kadi Hession: PA TIENT, NO Other Clinician: Referring Laverne Klugh: Treating Jermani Pund/Extender: Morrie Sheldon, [place] RO LINE Weeks in Treatment: 10 Wound Status Wound Number: 2 Primary Etiology: Arterial Insufficiency Ulcer Wound Location: Left, Lateral Ankle Wound Status: Open Wounding Event: Blister Date Acquired: 08/06/2019 Weeks Of Treatment: 10 Clustered Wound: No Wound Measurements Length: (cm) 1.6 Width: (cm) 1.6 Depth: (cm) 0.2 Area: (cm) 2.011 Volume: (cm) 0.402 % Reduction in Area: -631.3% % Reduction in Volume:  -1388.9% Wound Description Classification: Full Thickness Without Exposed Support Structur es Treatment Notes Wound #2 (Left, Lateral Ankle) 1. Cleanse With Wound Cleanser Soap and water 2. Periwound Care Moisturizing lotion 3. Primary Dressing Applied Calcium Alginate Other primary dressing (specifiy in notes) 4. Secondary Dressing Dry Gauze 6. Support Layer Applied Kerlix/Coban 7. Footwear/Offloading device applied Surgical shoe Notes primary dressing medihoney alginate. netting. Electronic Signature(s) Signed: 11/06/2019 5:56:12 PM By: Shawn Stall Entered By: Shawn Stall on 11/06/2019 14:43:07 -------------------------------------------------------------------------------- Wound Assessment Details Patient Name: Date of Service: THA Matthew [name], Matthew [name]. 11/06/2019 1:00 PM Medical Record Number: [id number] Patient Account Number: [id number] Date of Birth/Sex: Treating RN: [birth year] (69 y.o. Matthew [name] Primary Care Christoper Bushey: PA TIENT, NO Other Clinician: Referring Danise Dehne: Treating Dicky Boer/Extender: Morrie Sheldon, [place] RO LINE Weeks in Treatment: 10 Wound Status Wound Number: 3 Primary Etiology: Arterial Insufficiency Ulcer Wound Location: Left, Dorsal Foot Wound Status: Open Wounding Event: Gradually Appeared Date Acquired: 09/17/2019 Weeks Of Treatment: 7 Clustered Wound: No Wound Measurements Length: (cm) 0.3 Width: (cm) 0.2 Depth: (cm) 0.1 Area: (cm) 0.047 Volume: (cm) 0.005 % Reduction in Area: 76% % Reduction in Volume: 75% Wound Description Classification: Full Thickness Without Exposed Support Structur es Treatment Notes Wound #3 (Left, Dorsal Foot) 1. Cleanse With Wound Cleanser Soap and water 2. Periwound Care Moisturizing lotion 3. Primary Dressing Applied Calcium Alginate Other primary dressing (specifiy in notes) 4. Secondary Dressing Dry Gauze 6. Support Layer Applied Kerlix/Coban 7. Footwear/Offloading device  applied Surgical shoe Notes primary dressing medihoney alginate. netting. Electronic Signature(s) Signed: 11/06/2019 5:56:12 PM By: Shawn Stall Entered By: Shawn Stall on 11/06/2019 14:43:07 -------------------------------------------------------------------------------- Wound Assessment Details Patient Name: Date of Service: THA Matthew [name], Matthew [name]. 11/06/2019 1:00 PM Medical Record Number: [id number] Patient Account Number: [id number] Date of Birth/Sex: Treating RN: Oct 01, 1950 (69 y.o. Matthew [name] Primary Care Garo Heidelberg: PA TIENT, NO Other Clinician: Referring Eloise Picone: Treating Skyann Ganim/Extender: Morrie Sheldon, [place] RO LINE Weeks in Treatment: 10 Wound Status Wound Number: 4 Primary Etiology: Skin Tear Wound Location: Right, Dorsal Foot Wound Status: Open Wounding Event: Blister Date Acquired: 09/24/2019 Weeks Of Treatment: 6 Clustered Wound: No Wound Measurements Length: (cm) 1.6 Width: (cm) 1.5 Depth: (cm) 0.1 Area: (cm) 1.885 Volume: (cm) 0.188 % Reduction in Area: 61.6% % Reduction in Volume: 61.7% Wound Description Classification: Full Thickness Without Exposed Support  Structur es Treatment Notes Wound #4 (Right, Dorsal Foot) 1. Cleanse With Wound Cleanser Soap and water 2. Periwound Care Moisturizing lotion 3. Primary Dressing Applied Calcium Alginate Other primary dressing (specifiy in notes) 4. Secondary Dressing Dry Gauze 6. Support Layer Applied Kerlix/Coban 7. Footwear/Offloading device applied Surgical shoe Notes primary dressing medihoney alginate. netting. Electronic Signature(s) Signed: 11/06/2019 5:56:12 PM By: Deon Pilling Entered By: Deon Pilling on 11/06/2019 14:43:07 -------------------------------------------------------------------------------- Vitals Details Patient Name: Date of Service: THA Duard Larsen, Matthew [name]. 11/06/2019 1:00 PM Medical Record Number: [id number] Patient Account Number: [id number] Date of  Birth/Sex: Treating RN: 02-Dec-1950 (69 y.o. Hessie Diener Primary Care Arth Nicastro: PA TIENT, NO Other Clinician: Referring Enzio Buchler: Treating Cypress Fanfan/Extender: Lehman Prom, [place] RO LINE Weeks in Treatment: 10 Vital Signs Time Taken: 13:06 Temperature (F): 97.7 Height (in): 72 Pulse (bpm): 65 Weight (lbs): 130 Respiratory Rate (breaths/min): 18 Body Mass Index (BMI): 17.6 Blood Pressure (mmHg): 132/66 Reference Range: 80 - 120 mg / dl Electronic Signature(s) Signed: 11/06/2019 5:56:12 PM By: Deon Pilling Entered By: Deon Pilling on 11/06/2019 14:42:48

## 2019-11-08 NOTE — Progress Notes (Signed)
Matthew [name], Matthew [name] ([id number]) . Visit Report for 10/29/2019 Arrival Information Details Patient Name: Date of Service: THA Matthew [name], Matthew Maine [name]. 10/29/2019 2:30 PM Medical Record Number: [id number] Patient Account Number: [id number] Date of Birth/Sex: Treating RN: February 08, 1951 (69 y.o. Matthew [name] Primary Care Matthew [name]: PA TIENT, NO Other Clinician: Referring Matthew [name]: Treating Matthew [name]/Extender: Matthew [name], [place] RO LINE Weeks in Treatment: 9 Visit Information History Since Last Visit Added or deleted any medications: No Patient Arrived: Matthew [name] Any new allergies or adverse reactions: No Arrival Time: 14:11 Had a fall or experienced change in No Accompanied By: self activities of daily living that may affect Transfer Assistance: None risk of falls: Patient Identification Verified: Yes Signs or symptoms of abuse/neglect since last visito No Secondary Verification Process Completed: Yes Hospitalized since last visit: No Patient Requires Transmission-Based Precautions: No Implantable device outside of the clinic excluding No Patient Has Alerts: No cellular tissue based products placed in the center since last visit: Has Dressing in Place as Prescribed: Yes Pain Present Now: Yes Electronic Signature(s) Signed: 11/08/2019 9:13:31 AM By: Matthew [name] Entered By: Matthew [name] on 10/29/2019 14:11:18 -------------------------------------------------------------------------------- Encounter Discharge Information Details Patient Name: Date of Service: THA Matthew [name], Matthew RGE [name]. 10/29/2019 2:30 PM Medical Record Number: [id number] Patient Account Number: [id number] Date of Birth/Sex: Treating RN: 08-23-1950 (69 y.o. Matthew [name] Primary Care Matthew [name]: PA Matthew [name], NO Other Clinician: Referring Matthew [name]: Treating Matthew [name]/Extender: Matthew [name], [place] RO LINE Weeks in Treatment: 9 Encounter Discharge Information Items Post Procedure Vitals Discharge Condition:  Stable Temperature (F): 97.7 Ambulatory Status: Cane Pulse (bpm): 57 Discharge Destination: Home Respiratory Rate (breaths/min): 18 Transportation: Private Auto Blood Pressure (mmHg): 134/69 Accompanied By: self Schedule Follow-up Appointment: Yes Clinical Summary of Care: Electronic Signature(s) Signed: 10/29/2019 4:00:55 PM By: Matthew [name] Entered By: Matthew [name] on 10/29/2019 15:18:10 -------------------------------------------------------------------------------- Lower Extremity Assessment Details Patient Name: Date of Service: THA Matthew [name], Matthew RGE [name]. 10/29/2019 2:30 PM Medical Record Number: [id number] Patient Account Number: [id number] Date of Birth/Sex: Treating RN: [birth year] (69 y.o. Matthew [name] Primary Care Matthew [name]: PA TIENT, NO Other Clinician: Referring Matthew [name]: Treating Matthew [name]/Extender: Matthew [name], [place] RO LINE Weeks in Treatment: 9 Edema Assessment Assessed: [Left: No] [[name]: No] Edema: [Left: Yes] [[name]: Yes] Calf Left: [name]: Point of Measurement: 41 cm From Medial Instep 32 cm 31.5 cm Ankle Left: [name]: Point of Measurement: 10 cm From Medial Instep 24 cm 24.5 cm Vascular Assessment Pulses: Dorsalis Pedis Palpable: [[name]:Yes] Electronic Signature(s) Signed: 10/29/2019 4:34:07 PM By: Matthew [name] Entered By: Matthew [name] on 10/29/2019 14:32:48 -------------------------------------------------------------------------------- Multi-Disciplinary Care Plan Details Patient Name: Date of Service: THA Matthew [name], Matthew RGE [name]. 10/29/2019 2:30 PM Medical Record Number: [id number] Patient Account Number: [id number] Date of Birth/Sex: Treating RN: 03-Jan-1951 (69 y.o. Matthew [name] Primary Care Danaye Sobh: PA Matthew [name], NO Other Clinician: Referring Matthew [name]: Treating Matthew [name]/Extender: Matthew [name], [place] RO LINE Weeks in Treatment: 9 Active Inactive Wound/Skin Impairment Nursing Diagnoses: Impaired tissue  integrity Knowledge deficit related to ulceration/compromised skin integrity Goals: Patient/caregiver will verbalize understanding of skin care regimen Date Initiated: 08/27/2019 Target Resolution Date: 11/30/2019 Goal Status: Active Ulcer/skin breakdown will have a volume reduction of 30% by week 4 Date Initiated: 08/27/2019 Date Inactivated: 09/24/2019 Target Resolution Date: 09/28/2019 Unmet Reason: PAD, non viable Goal Status: Unmet surface Interventions: Assess patient/caregiver ability to obtain necessary supplies Assess patient/caregiver ability to perform ulcer/skin care regimen upon admission and as needed Assess ulceration(s) every visit  Provide education on ulcer and skin care Notes: Electronic Signature(s) Signed: 10/29/2019 5:12:22 PM By: Zandra Matthew [name] Entered By: Zandra Matthew on 10/29/2019 17:04:05 -------------------------------------------------------------------------------- Pain Assessment Details Patient Name: Date of Service: THA Matthew [name], Matthew RGE [name]. 10/29/2019 2:30 PM Medical Record Number: [id number] Patient Account Number: [id number] Date of Birth/Sex: Treating RN: 12/23/1950 (69 y.o. Matthew [name] Primary Care Matthew [name]: PA Matthew [name], NO Other Clinician: Referring Matthew [name]: Treating Matthew [name]/Extender: Matthew [name], [place] RO LINE Weeks in Treatment: 9 Active Problems Location of Pain Severity and Description of Pain Patient Has Paino Yes Site Locations Rate the pain. Current Pain Level: 10 Pain Management and Medication Current Pain Management: Electronic Signature(s) Signed: 10/29/2019 5:12:22 PM By: Zandra Matthew [name] Signed: 11/08/2019 9:13:31 AM By: Matthew [name] Entered By: Matthew [name] on 10/29/2019 14:14:03 -------------------------------------------------------------------------------- Patient/Caregiver Education Details Patient Name: Date of Service: THA Matthew [name], Matthew RGE [name]. 5/24/2021andnbsp2:30 PM Medical Record  Number: [id number] Patient Account Number: [id number] Date of Birth/Gender: Treating RN: April 29, 1951 (69 y.o. Matthew [name] Primary Care Physician: PA Matthew [name], NO Other Clinician: Referring Physician: Treating Physician/Extender: Matthew [name], [place] RO LINE Weeks in Treatment: 9 Education Assessment Education Provided To: Patient Education Topics Provided Wound/Skin Impairment: Methods: Explain/Verbal Responses: State content correctly Electronic Signature(s) Signed: 10/29/2019 5:12:22 PM By: Zandra Matthew [name] Entered By: Zandra Matthew on 10/29/2019 17:04:17 -------------------------------------------------------------------------------- Wound Assessment Details Patient Name: Date of Service: THA Matthew [name], Matthew RGE [name]. 10/29/2019 2:30 PM Medical Record Number: [id number] Patient Account Number: [id number] Date of Birth/Sex: Treating RN: 1950-06-19 (69 y.o. Matthew [name] Primary Care Ayari Liwanag: PA TIENT, NO Other Clinician: Referring Sallyanne Birkhead: Treating Jerick Khachatryan/Extender: Matthew [name], CA RO LINE Weeks in Treatment: 9 Wound Status Wound Number: 1 Primary Etiology: Arterial Insufficiency Ulcer Wound Location: Left, Distal, Anterior Lower Leg Wound Status: Open Wounding Event: Trauma Comorbid History: Peripheral Arterial Disease Date Acquired: 07/08/2018 Weeks Of Treatment: 9 Clustered Wound: No Photos Photo Uploaded By: Benjaman Kindler on 11/01/2019 08:45:13 Wound Measurements Length: (cm) 10.1 Width: (cm) 4 Depth: (cm) 0.2 Area: (cm) 31.73 Volume: (cm) 6.346 % Reduction in Area: -53.9% % Reduction in Volume: -53.9% Epithelialization: Small (1-33%) Tunneling: No Undermining: No Wound Description Classification: Full Thickness Without Exposed Support Structures Wound Margin: Well defined, not attached Exudate Amount: Medium Exudate Type: Purulent Exudate Color: yellow, brown, green Foul Odor After Cleansing: No Slough/Fibrino  Yes Wound Bed Granulation Amount: Medium (34-66%) Exposed Structure Granulation Quality: Pink Fascia Exposed: No Necrotic Amount: Medium (34-66%) Fat Layer (Subcutaneous Tissue) Exposed: Yes Necrotic Quality: Adherent Slough Tendon Exposed: No Muscle Exposed: No Joint Exposed: No Bone Exposed: No Treatment Notes Wound #1 (Left, Distal, Anterior Lower Leg) 1. Cleanse With Wound Cleanser Soap and water 2. Periwound Care Moisturizing lotion TCA Cream 3. Primary Dressing Applied Calcium Alginate Other primary dressing (specifiy in notes) 4. Secondary Dressing Dry Gauze 6. Support Layer Applied Kerlix/Coban Notes netting. Electronic Signature(s) Signed: 10/29/2019 4:34:07 PM By: Matthew [name] Signed: 10/29/2019 5:12:22 PM By: Zandra Matthew [name] Entered By: Matthew [name] on 10/29/2019 14:33:06 -------------------------------------------------------------------------------- Wound Assessment Details Patient Name: Date of Service: THA Matthew [name], Matthew RGE [name]. 10/29/2019 2:30 PM Medical Record Number: [id number] Patient Account Number: [id number] Date of Birth/Sex: Treating RN: April 26, 1951 (70 y.o. Matthew [name] Primary Care Samyah Bilbo: PA Matthew [name], NO Other Clinician: Referring Rylah Fukuda: Treating Blandina Renaldo/Extender: Matthew [name], CA RO LINE Weeks in Treatment: 9 Wound Status Wound Number: 2 Primary Etiology: Arterial Insufficiency Ulcer Wound Location:  Left, Lateral Ankle Wound Status: Open Wounding Event: Blister Comorbid History: Peripheral Arterial Disease Date Acquired: 08/06/2019 Weeks Of Treatment: 9 Clustered Wound: No Photos Photo Uploaded By: Benjaman Kindler on 11/01/2019 08:44:50 Wound Measurements Length: (cm) 1.6 Width: (cm) 1.6 Depth: (cm) 0.2 Area: (cm) 2.011 Volume: (cm) 0.402 % Reduction in Area: -631.3% % Reduction in Volume: -1388.9% Epithelialization: None Tunneling: No Undermining: No Wound Description Classification: Full  Thickness Without Exposed Support Structu Wound Margin: Distinct, outline attached Exudate Amount: Small Exudate Type: Serosanguineous Exudate Color: red, brown res Foul Odor After Cleansing: No Slough/Fibrino Yes Wound Bed Granulation Amount: None Present (0%) Exposed Structure Necrotic Amount: Large (67-100%) Fascia Exposed: No Necrotic Quality: Adherent Slough Fat Layer (Subcutaneous Tissue) Exposed: No Tendon Exposed: No Muscle Exposed: No Joint Exposed: No Bone Exposed: No Treatment Notes Wound #2 (Left, Lateral Ankle) 1. Cleanse With Wound Cleanser Soap and water 2. Periwound Care Moisturizing lotion TCA Cream 3. Primary Dressing Applied Calcium Alginate Other primary dressing (specifiy in notes) 4. Secondary Dressing Dry Gauze 6. Support Layer Applied Kerlix/Coban Notes netting. Electronic Signature(s) Signed: 10/29/2019 4:34:07 PM By: Matthew [name] Signed: 10/29/2019 5:12:22 PM By: Zandra Matthew [name] Entered By: Matthew [name] on 10/29/2019 14:33:38 -------------------------------------------------------------------------------- Wound Assessment Details Patient Name: Date of Service: THA Matthew [name], Matthew RGE [name]. 10/29/2019 2:30 PM Medical Record Number: [id number] Patient Account Number: [id number] Date of Birth/Sex: Treating RN: 1951/01/17 (69 y.o. Matthew [name] Primary Care Prosper Paff: PA TIENT, NO Other Clinician: Referring Irineo Gaulin: Treating Darnisha Vernet/Extender: Matthew [name], [place] RO LINE Weeks in Treatment: 9 Wound Status Wound Number: 3 Primary Etiology: Arterial Insufficiency Ulcer Wound Location: Left, Dorsal Foot Wound Status: Open Wounding Event: Gradually Appeared Comorbid History: Peripheral Arterial Disease Date Acquired: 09/17/2019 Weeks Of Treatment: 6 Clustered Wound: No Photos Photo Uploaded By: Benjaman Kindler on 11/01/2019 08:45:14 Wound Measurements Length: (cm) 0.3 Width: (cm) 0.2 Depth: (cm) 0.1 Area: (cm)  0.047 Volume: (cm) 0.005 % Reduction in Area: 76% % Reduction in Volume: 75% Epithelialization: None Tunneling: No Undermining: No Wound Description Classification: Full Thickness Without Exposed Support Structu Wound Margin: Distinct, outline attached Exudate Amount: Small Exudate Type: Serous Exudate Color: amber res Foul Odor After Cleansing: No Slough/Fibrino Yes Wound Bed Granulation Amount: Small (1-33%) Exposed Structure Granulation Quality: Pink Fascia Exposed: No Necrotic Amount: Large (67-100%) Fat Layer (Subcutaneous Tissue) Exposed: Yes Necrotic Quality: Adherent Slough Tendon Exposed: No Muscle Exposed: No Joint Exposed: No Bone Exposed: No Treatment Notes Wound #3 (Left, Dorsal Foot) 1. Cleanse With Wound Cleanser Soap and water 2. Periwound Care Moisturizing lotion TCA Cream 3. Primary Dressing Applied Calcium Alginate Other primary dressing (specifiy in notes) 4. Secondary Dressing Dry Gauze 6. Support Layer Applied Kerlix/Coban Notes netting. Electronic Signature(s) Signed: 10/29/2019 4:34:07 PM By: Matthew [name] Signed: 10/29/2019 5:12:22 PM By: Zandra Matthew [name] Entered By: Matthew [name] on 10/29/2019 14:34:20 -------------------------------------------------------------------------------- Wound Assessment Details Patient Name: Date of Service: THA Matthew [name], Matthew RGE [name]. 10/29/2019 2:30 PM Medical Record Number: [id number] Patient Account Number: [id number] Date of Birth/Sex: Treating RN: 1950-09-21 (69 y.o. Matthew [name] Primary Care Lawrence Roldan: PA Matthew [name], NO Other Clinician: Referring Maxmillian Carsey: Treating Tayloranne Lekas/Extender: Matthew [name], [place] RO LINE Weeks in Treatment: 9 Wound Status Wound Number: 4 Primary Etiology: Skin Tear Wound Location: [name], Dorsal Foot Wound Status: Open Wounding Event: Blister Comorbid History: Peripheral Arterial Disease Date Acquired: 09/24/2019 Weeks Of Treatment: 5 Clustered Wound:  No Photos Photo Uploaded By: Benjaman Kindler on 11/01/2019 08:44:30 Wound Measurements Length: (  cm) 1.6 Width: (cm) 1.5 Depth: (cm) 0.1 Area: (cm) 1.885 Volume: (cm) 0.188 % Reduction in Area: 61.6% % Reduction in Volume: 61.7% Epithelialization: None Tunneling: No Undermining: No Wound Description Classification: Full Thickness Without Exposed Support Structures Wound Margin: Flat and Intact Exudate Amount: Medium Exudate Type: Serous Exudate Color: amber Foul Odor After Cleansing: No Slough/Fibrino Yes Wound Bed Granulation Amount: Small (1-33%) Exposed Structure Granulation Quality: Pink Fascia Exposed: No Necrotic Amount: Large (67-100%) Fat Layer (Subcutaneous Tissue) Exposed: Yes Necrotic Quality: Adherent Slough Tendon Exposed: No Muscle Exposed: No Joint Exposed: No Bone Exposed: No Treatment Notes Wound #4 ([name], Dorsal Foot) 1. Cleanse With Wound Cleanser Soap and water 2. Periwound Care Moisturizing lotion TCA Cream 3. Primary Dressing Applied Calcium Alginate Other primary dressing (specifiy in notes) 4. Secondary Dressing Dry Gauze 6. Support Layer Applied Kerlix/Coban Notes netting. Electronic Signature(s) Signed: 10/29/2019 4:34:07 PM By: Kela Millin Signed: 10/29/2019 5:12:22 PM By: Levan Hurst RN, [name] Entered By: Kela Millin on 10/29/2019 14:34:43 -------------------------------------------------------------------------------- Vitals Details Patient Name: Date of Service: THA Matthew [name], Matthew RGE [name]. 10/29/2019 2:30 PM Medical Record Number: [id number] Patient Account Number: [id number] Date of Birth/Sex: Treating RN: 06-Feb-1951 (69 y.o. Matthew [name] Primary Care Golden Gilreath: PA TIENT, NO Other Clinician: Referring Marisah Laker: Treating Kamaria Lucia/Extender: Theodoro Kos, [place] RO LINE Weeks in Treatment: 9 Vital Signs Time Taken: 14:13 Temperature (F): 97.7 Height (in): 72 Pulse (bpm): 75 Weight (lbs):  130 Respiratory Rate (breaths/min): 18 Body Mass Index (BMI): 17.6 Blood Pressure (mmHg): 134/69 Reference Range: 80 - 120 mg / dl Electronic Signature(s) Signed: 11/08/2019 9:13:31 AM By: Sandre Kitty Entered By: Sandre Kitty on 10/29/2019 14:13:59

## 2019-11-12 ENCOUNTER — Encounter (HOSPITAL_BASED_OUTPATIENT_CLINIC_OR_DEPARTMENT_OTHER): Payer: Medicare HMO | Admitting: Internal Medicine

## 2019-11-12 DIAGNOSIS — L97512 Non-pressure chronic ulcer of other part of right foot with fat layer exposed: Secondary | ICD-10-CM | POA: Diagnosis not present

## 2019-11-12 DIAGNOSIS — L97322 Non-pressure chronic ulcer of left ankle with fat layer exposed: Secondary | ICD-10-CM | POA: Diagnosis not present

## 2019-11-12 DIAGNOSIS — L97828 Non-pressure chronic ulcer of other part of left lower leg with other specified severity: Secondary | ICD-10-CM | POA: Diagnosis not present

## 2019-11-12 DIAGNOSIS — I70243 Atherosclerosis of native arteries of left leg with ulceration of ankle: Secondary | ICD-10-CM | POA: Diagnosis not present

## 2019-11-12 DIAGNOSIS — L97822 Non-pressure chronic ulcer of other part of left lower leg with fat layer exposed: Secondary | ICD-10-CM | POA: Diagnosis not present

## 2019-11-12 DIAGNOSIS — I70248 Atherosclerosis of native arteries of left leg with ulceration of other part of lower left leg: Secondary | ICD-10-CM | POA: Diagnosis not present

## 2019-11-12 DIAGNOSIS — I70242 Atherosclerosis of native arteries of left leg with ulceration of calf: Secondary | ICD-10-CM | POA: Diagnosis not present

## 2019-11-12 NOTE — Progress Notes (Signed)
KERN, GINGRAS T ([id number]) . Visit Report for 11/12/2019 Debridement Details Patient Name: Date of Service: THA Matthew [name], Matthew RGE T. 11/12/2019 2:00 PM Medical Record Number: [id number] Patient Account Number: [id number] Date of Birth/Sex: Treating RN: July 15, 1950 (69 y.o. Matthew [name] Primary Care Provider: PA Haig Prophet, NO Other Clinician: Referring Provider: Treating Provider/Extender: Lehman Prom, [place] RO LINE Weeks in Treatment: 11 Debridement Performed for Assessment: Wound #1 Left,Distal,Anterior Lower Leg Performed By: Clinician Levan Hurst, RN Debridement Type: Chemical/Enzymatic/Mechanical Agent Used: Medihoney Severity of Tissue Pre Debridement: Fat layer exposed Level of Consciousness (Pre-procedure): Awake and Alert Pre-procedure Verification/Time Out No Taken: Bleeding: None Response to Treatment: Procedure was tolerated well Level of Consciousness (Post- Awake and Alert procedure): Post Debridement Measurements of Total Wound Length: (cm) 9 Width: (cm) 3.2 Depth: (cm) 0.2 Volume: (cm) 4.524 Character of Wound/Ulcer Post Debridement: Requires Further Debridement Severity of Tissue Post Debridement: Fat layer exposed Post Procedure Diagnosis Same as Pre-procedure Electronic Signature(s) Signed: 11/12/2019 6:28:13 PM By: Levan Hurst RN, BSN Signed: 11/12/2019 8:16:21 PM By: Linton Ham MD Entered By: Levan Hurst on 11/12/2019 17:37:19 -------------------------------------------------------------------------------- Debridement Details Patient Name: Date of Service: THA Matthew [name], Matthew RGE T. 11/12/2019 2:00 PM Medical Record Number: [id number] Patient Account Number: [id number] Date of Birth/Sex: Treating RN: 05-30-1951 (69 y.o. Matthew [name] Primary Care Provider: PA Haig Prophet, NO Other Clinician: Referring Provider: Treating Provider/Extender: Lehman Prom, [place] RO LINE Weeks in Treatment: 11 Debridement Performed for Assessment: Wound  #2 Left,Lateral Ankle Performed By: Clinician Levan Hurst, RN Debridement Type: Chemical/Enzymatic/Mechanical Agent Used: Medihoney Severity of Tissue Pre Debridement: Fat layer exposed Level of Consciousness (Pre-procedure): Awake and Alert Pre-procedure Verification/Time Out No Taken: Bleeding: None Response to Treatment: Procedure was tolerated well Level of Consciousness (Post- Awake and Alert Awake and Alert procedure): Post Debridement Measurements of Total Wound Length: (cm) 1.5 Width: (cm) 1.1 Depth: (cm) 0.3 Volume: (cm) 0.389 Character of Wound/Ulcer Post Debridement: Requires Further Debridement Severity of Tissue Post Debridement: Fat layer exposed Post Procedure Diagnosis Same as Pre-procedure Electronic Signature(s) Signed: 11/12/2019 6:28:13 PM By: Levan Hurst RN, BSN Signed: 11/12/2019 8:16:21 PM By: Linton Ham MD Entered By: Levan Hurst on 11/12/2019 17:37:39 -------------------------------------------------------------------------------- Debridement Details Patient Name: Date of Service: THA Matthew [name], Matthew RGE T. 11/12/2019 2:00 PM Medical Record Number: [id number] Patient Account Number: [id number] Date of Birth/Sex: Treating RN: 1950-06-29 (69 y.o. Matthew [name] Primary Care Provider: PA Haig Prophet, NO Other Clinician: Referring Provider: Treating Provider/Extender: Lehman Prom, [place] RO LINE Weeks in Treatment: 11 Debridement Performed for Assessment: Wound #4 Right,Dorsal Foot Performed By: Clinician Levan Hurst, RN Debridement Type: Chemical/Enzymatic/Mechanical Agent Used: Medihoney Level of Consciousness (Pre-procedure): Awake and Alert Pre-procedure Verification/Time Out No Taken: Bleeding: None Response to Treatment: Procedure was tolerated well Level of Consciousness (Post- Awake and Alert procedure): Post Debridement Measurements of Total Wound Length: (cm) 1.7 Width: (cm) 1.4 Depth: (cm) 0.1 Volume: (cm)  0.187 Character of Wound/Ulcer Post Debridement: Requires Further Debridement Post Procedure Diagnosis Same as Pre-procedure Electronic Signature(s) Signed: 11/12/2019 6:28:13 PM By: Levan Hurst RN, BSN Signed: 11/12/2019 8:16:21 PM By: Linton Ham MD Entered By: Levan Hurst on 11/12/2019 17:38:02 -------------------------------------------------------------------------------- HPI Details Patient Name: Date of Service: THA Matthew [name], Matthew RGE T. 11/12/2019 2:00 PM Medical Record Number: [id number] Patient Account Number: [id number] Date of Birth/Sex: Treating RN: [birth year] (69 y.o. Matthew [name] Primary Care Provider: Other Clinician: PA TIENT, NO Referring Provider: Treating Provider/Extender: Lehman Prom,  [place] RO LINE Weeks in Treatment: 11 History of Present Illness HPI Description: HasADMISSION 08/27/2019 This is a 69 year old man who tells me that he fell going up stairs about a year ago resulting in a deep laceration type wound on the left anterior mid tibia. He was soon found to have significant PAD/critical limb ischemia. He had had previous noninvasive studies in 2017 showing an ABI of 0.82 on the right and 0.91 on the left with monophasic to biphasic waveforms. On 07/18/2019 his ABI in the right was 0.58 on the left 0.52 with TBI's of 0.51 on the right and 0 on the left. He underwent an angiogram by Dr. Randie Heinz on 3/1. He underwent bilateral iliac artery stents he was also discovered to have a left-sided SFA with it was patent up to 10 centimeters proximal to the adductor canal but then became stenotic with a 95% stenosis for 15 cm. Dominant runoff is via the posterior tibial artery. The patient still has the wound on the left anterior mid tibia. He has been using Vaseline to this. He wraps this and gauze and some form of Ace wrap. He complains bitterly of the pain also complains about falling with the leg giving out on him. About 2 weeks ago he developed a new wound  on the left lateral ankle. He had a DVT rule out study on 3/16/ 1 that was negative for a DVT Past medical history includes cervical spondylosis, left carotid bruit, marked PAD. ABI in our clinic was 0.71 on the left 3/29; patient admitted to the clinic last week with a laceration type injury on the left anterior mid tibia in the setting of severe PAD. He was supposed to follow- up with Dr. Randie Heinz last week but he tells me he had to cancel the appointment because of nausea and vomiting. I have encouraged him to rebook this ASAP. He tolerated our kerlix Coban wrap without incidence that the pain was better. We used Iodoflex to the wound area 4/5; working on getting him back in with Dr. Randie Heinz. We have been using Iodoflex under Curlex and Coban. He seems to be tolerating this 4/12; still complaining of a lot of pain. Wound itself on the left anterior mid tibia looks about the same perhaps somewhat better looking tissue. he has a new area on the left medial lower leg and ankle. He has previously had bilateral external iliac artery stents. Still complaining of a lot of pain 4/16; still complaining of a lot of pain. He has noninvasive studies on Friday morning and sees Dr. Randie Heinz after this. Large wound on the left anterior tibia. Smaller areas on the left lateral lower leg and ankle. He arrives in clinic today with blisters on his dorsal right leg is swollen 4/26; still complaining of a lot of pain I changed him to [facility] from Iodoflex last week but that does not seem to have helped. He has smaller areas on the left lateral lower leg and ankle which also look ischemic. We sent him to see Dr. Randie Heinz unfortunately his kerlix and Coban wrap was not removed. They were able to document that his bilateral common iliac artery stents and left SFA stents were patent based on duplex. However they did not look at the tibial vessels toe brachial pressures etc. I am not sure whether his original angiograms looked at  these in any detail. 5/3; the patient has a substantial wound on the left anterior mid tibia smaller wound on the left lateral ankle both of  these very painful. He also has an area on the right dorsal foot. I did speak with Dr. Randie Heinz last week they are trying to get him into the office to look at his arterial status especially in the left lower leg. The stents previously placed in the iliacs bilaterally and the left SFA were patent. We are using Hydrofera Blue 5/10; after I spoke with Dr. Randie Heinz. Secure text message apparently his office call the patient 2 weeks ago but he has not heard anything since. I was hoping that they would do noninvasive studies on the left lower leg. Unfortunately there is absolutely no change in this wound considerably long traumatic wound on the anterior tibia and the area on the lateral lower ankle. We have been using Medihoney under calcium alginate. He also has an area on the right dorsal foot. We are using silver alginate here. 10/29/19-Patient is back at 2 weeks, is seeing Dr. Pascal Lux who is proposing angiography and antegrade revascularization on the left given that there are stents in this limb, patient is a lot of pain on the wound on the left anterior leg, we are using Medihoney, the right dorsal wound also looks about the same 6/7; this appointment finally with Dr. Randie Heinz on Friday. He is going to need some form of angiogram in the left leg. In spite of the ischemia he has had improvements in the left anterior tibial wound to small punched out painful wounds on the left lateral ankle are about the same. Also the area on the right dorsal foot. Electronic Signature(s) Signed: 11/12/2019 8:16:21 PM By: Baltazar Najjar MD Entered By: Baltazar Najjar on 11/12/2019 16:47:05 -------------------------------------------------------------------------------- Physical Exam Details Patient Name: Date of Service: THA [name], Matthew RGE T. 11/12/2019 2:00 PM Medical Record Number:  [id number] Patient Account Number: [id number] Date of Birth/Sex: Treating RN: 10-29-1950 (69 y.o. [name] Primary Care Provider: PA Zenovia Jordan, NO Other Clinician: Referring Provider: Treating Provider/Extender: Morrie Sheldon, [place] RO LINE Weeks in Treatment: 11 Constitutional Sitting or standing Blood Pressure is within target range for patient.. Pulse regular and within target range for patient.Marland Kitchen Respirations regular, non-labored and within target range.. Temperature is normal and within the target range for the patient.Marland Kitchen Appears in no distress. Cardiovascular Pedal pulses absent bilaterally.. Notes Wound exam; the left anterior leg wound actually looks somewhat better than when I last saw this 2 weeks ago. Rims of epithelialization no debridement is necessary. Punched out areas on the left lateral ankle area are about the same these are painful Area on the right dorsal foot also about the same Electronic Signature(s) Signed: 11/12/2019 8:16:21 PM By: Baltazar Najjar MD Entered By: Baltazar Najjar on 11/12/2019 16:48:00 -------------------------------------------------------------------------------- Physician Orders Details Patient Name: Date of Service: THA [name], Matthew RGE T. 11/12/2019 2:00 PM Medical Record Number: [id number] Patient Account Number: [id number] Date of Birth/Sex: Treating RN: 1950/11/11 (69 y.o. 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 Dressing Change Frequency Wound #1 Left,Distal,Anterior Lower Leg Change Dressing every other day. Wound #2 Left,Lateral Ankle Change Dressing every other day. Wound #4 Right,Dorsal Foot Change Dressing every other day. Skin Barriers/Peri-Wound Care Moisturizing lotion Wound Cleansing May shower and wash wound with soap and water. - on days that dressing is changed Primary Wound Dressing Wound #1 Left,Distal,Anterior Lower Leg Medihoney Alginate Wound #2 Left,Lateral Ankle Medihoney Alginate Wound #4 Right,Dorsal Foot Medihoney Alginate Secondary Dressing Wound #1 Left,Distal,Anterior Lower Leg Kerlix/Rolled Gauze Dry Gauze ABD pad Wound #2 Left,Lateral Ankle Kerlix/Rolled Gauze Dry Gauze ABD pad Wound #4 Right,Dorsal Foot Foam Border Edema Control Avoid standing for long periods of time Elevate legs to the level of the heart or above for 30 minutes daily and/or when sitting, a frequency of: - throughout the day Electronic Signature(s) Signed: 11/12/2019 6:28:13 PM By: Zandra Abts RN, BSN Signed: 11/12/2019 8:16:21 PM By: Baltazar Najjar MD Entered By: Zandra Abts on 11/12/2019 15:46:14 -------------------------------------------------------------------------------- Problem List Details Patient Name: Date of Service: THA [name], Matthew RGE T. 11/12/2019 2:00 PM Medical Record Number: [id number] Patient Account Number: [id number] Date of Birth/Sex: Treating RN: [birth year] (69 y.o. 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 08/27/2019 No Yes L97.828 Non-pressure chronic ulcer of other part of left lower leg with other specified 08/27/2019 No Yes severity L97.322 Non-pressure chronic ulcer of left ankle with fat layer exposed 08/27/2019 No Yes L97.518 Non-pressure chronic ulcer of other part of right foot with other specified 10/08/2019 No Yes severity Inactive Problems Resolved Problems Electronic Signature(s) Signed: 11/12/2019 8:16:21 PM By: Baltazar Najjar MD Entered By: Baltazar Najjar on 11/12/2019 16:45:56 -------------------------------------------------------------------------------- Progress Note Details Patient Name: Date of Service: THA [name], Matthew RGE T. 11/12/2019 2:00 PM Medical Record Number: [id number] Patient Account Number: [id number] Date of Birth/Sex: Treating RN: 1950/10/01 (69 y.o. [name] Primary Care Provider: PA Zenovia Jordan, NO Other Clinician: Referring Provider: Treating Provider/Extender: Morrie Sheldon, [place] RO LINE Weeks in Treatment: 11 Subjective History of Present Illness (HPI) HasADMISSION 08/27/2019 This is a 69 year old man who tells me that he fell going up stairs about a year ago resulting in a deep laceration type wound on the left anterior mid tibia. He was soon found to have significant PAD/critical limb ischemia. He had had previous noninvasive studies in 2017 showing an ABI of 0.82 on the right and 0.91 on the left with monophasic to biphasic waveforms. On 07/18/2019 his ABI in the right was 0.58 on the left 0.52 with TBI's of 0.51 on the right and 0 on the left. He underwent an angiogram by Dr. Randie Heinz on 3/1. He underwent bilateral iliac artery stents he was also discovered to have a left-sided SFA with it was patent up to 10 centimeters proximal to the adductor canal but then became stenotic with a 95% stenosis for 15 cm. Dominant runoff is via the  posterior tibial artery. The patient still has the wound on the left anterior mid tibia. He has been using Vaseline to this. He wraps this and gauze and some form of Ace wrap. He complains bitterly of the pain also complains about falling  with the leg giving out on him. About 2 weeks ago he developed a new wound on the left lateral ankle. He had a DVT rule out study on 3/16/ 1 that was negative for a DVT Past medical history includes cervical spondylosis, left carotid bruit, marked PAD. ABI in our clinic was 0.71 on the left 3/29; patient admitted to the clinic last week with a laceration type injury on the left anterior mid tibia in the setting of severe PAD. He was supposed to follow- up with Dr. Randie Heinz last week but he tells me he had to cancel the appointment because of nausea and vomiting. I have encouraged him to rebook this ASAP. He tolerated our kerlix Coban wrap without incidence that the pain was better. We used Iodoflex to the wound area 4/5; working on getting him back in with Dr. Randie Heinz. We have been using Iodoflex under Curlex and Coban. He seems to be tolerating this 4/12; still complaining of a lot of pain. Wound itself on the left anterior mid tibia looks about the same perhaps somewhat better looking tissue. he has a new area on the left medial lower leg and ankle. He has previously had bilateral external iliac artery stents. Still complaining of a lot of pain 4/16; still complaining of a lot of pain. He has noninvasive studies on Friday morning and sees Dr. Randie Heinz after this. Large wound on the left anterior tibia. Smaller areas on the left lateral lower leg and ankle. He arrives in clinic today with blisters on his dorsal right leg is swollen 4/26; still complaining of a lot of pain I changed him to [facility] from Iodoflex last week but that does not seem to have helped. He has smaller areas on the left lateral lower leg and ankle which also look ischemic. We sent him to see Dr.  Randie Heinz unfortunately his kerlix and Coban wrap was not removed. They were able to document that his bilateral common iliac artery stents and left SFA stents were patent based on duplex. However they did not look at the tibial vessels toe brachial pressures etc. I am not sure whether his original angiograms looked at these in any detail. 5/3; the patient has a substantial wound on the left anterior mid tibia smaller wound on the left lateral ankle both of these very painful. He also has an area on the right dorsal foot. I did speak with Dr. Randie Heinz last week they are trying to get him into the office to look at his arterial status especially in the left lower leg. The stents previously placed in the iliacs bilaterally and the left SFA were patent. We are using Hydrofera Blue 5/10; after I spoke with Dr. Randie Heinz. Secure text message apparently his office call the patient 2 weeks ago but he has not heard anything since. I was hoping that they would do noninvasive studies on the left lower leg. Unfortunately there is absolutely no change in this wound considerably long traumatic wound on the anterior tibia and the area on the lateral lower ankle. We have been using Medihoney under calcium alginate. He also has an area on the right dorsal foot. We are using silver alginate here. 10/29/19-Patient is back at 2 weeks, is seeing Dr. Pascal Lux who is proposing angiography and antegrade revascularization on the left given that there are stents in this limb, patient is a lot of pain on the wound on the left anterior leg, we are using Medihoney, the right dorsal wound also looks about the  same 6/7; this appointment finally with Dr. Randie Heinzain on Friday. He is going to need some form of angiogram in the left leg. In spite of the ischemia he has had improvements in the left anterior tibial wound to small punched out painful wounds on the left lateral ankle are about the same. Also the area on the right  dorsal foot. Objective Constitutional Sitting or standing Blood Pressure is within target range for patient.. Pulse regular and within target range for patient.Marland Kitchen. Respirations regular, non-labored and within target range.. Temperature is normal and within the target range for the patient.Marland Kitchen. Appears in no distress. Vitals Time Taken: 2:16 PM, Height: 72 in, Weight: 130 lbs, BMI: 17.6, Temperature: 97.7 F, Pulse: 82 bpm, Respiratory Rate: 18 breaths/min, Blood Pressure: 122/64 mmHg. Cardiovascular Pedal pulses absent bilaterally.. General Notes: Wound exam; the left anterior leg wound actually looks somewhat better than when I last saw this 2 weeks ago. Rims of epithelialization no debridement is necessary. ooPunched out areas on the left lateral ankle area are about the same these are painful ooArea on the right dorsal foot also about the same Integumentary (Hair, Skin) Wound #1 status is Open. Original cause of wound was Trauma. The [facility],Distal,Anterior Lower Leg. The wound measures 9cm length x 3.2cm width x 0.2cm depth; 22.619cm^2 area and 4.524cm^3 volume. There is Fat Layer (Subcutaneous Tissue) Exposed exposed. There is no tunneling or undermining noted. There is a medium amount of serosanguineous drainage noted. The wound margin is distinct with the outline attached to the wound base. There is large (67-100%) red, pink granulation within the wound bed. There is a small (1-33%) amount of necrotic tissue within the wound bed including Adherent Slough. Wound #2 status is Open. Original cause of wound was Blister. The wound is located on the Left,Lateral Ankle. The wound measures 1.5cm length x 1.1cm width x 0.3cm depth; 1.296cm^2 area and 0.389cm^3 volume. There is Fat Layer (Subcutaneous Tissue) Exposed exposed. There is no tunneling or undermining noted. There is a medium amount of serosanguineous drainage noted. The wound margin is distinct with the outline  attached to the wound base. There is medium (34-66%) red, pink granulation within the wound bed. There is a medium (34-66%) amount of necrotic tissue within the wound bed. Wound #3 status is Healed - Epithelialized. Original cause of wound was Gradually Appeared. The wound is located on the Left,Dorsal Foot. The wound measures 0cm length x 0cm width x 0cm depth; 0cm^2 area and 0cm^3 volume. Wound #4 status is Open. Original cause of wound was Blister. The wound is located on the Right,Dorsal Foot. The wound measures 1.7cm length x 1.4cm width x 0.1cm depth; 1.869cm^2 area and 0.187cm^3 volume. There is Fat Layer (Subcutaneous Tissue) Exposed exposed. There is no tunneling or undermining noted. There is a medium amount of drainage noted. The wound margin is distinct with the outline attached to the wound base. There is medium (34-66%) red, pink granulation within the wound bed. There is a medium (34-66%) amount of necrotic tissue within the wound bed including Adherent Slough. 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 There was a Chemical/Enzymatic/Mechanical debridement performed by Zandra Abts, RN.. Other agent used was Medihoney. There was no bleeding. The procedure was tolerated well. Post Debridement Measurements: 9cm length x 3.2cm width x 0.2cm depth; 4.524cm^3 volume. Character of Wound/Ulcer Post Debridement requires further  debridement. Severity of Tissue Post Debridement is: Fat layer exposed. Post procedure Diagnosis Wound #1: Same as Pre-Procedure Wound #2 Pre-procedure diagnosis of Wound #2 is an Arterial Insufficiency Ulcer located on the Left,Lateral Ankle .Severity of Tissue Pre Debridement is: Fat layer exposed. There was a Chemical/Enzymatic/Mechanical debridement performed by Zandra Abts, RN.. Other agent used was Medihoney. There was no bleeding. The procedure was tolerated well. Post Debridement Measurements: 1.5cm length x 1.1cm width x 0.3cm depth; 0.389cm^3 volume. Character of Wound/Ulcer Post Debridement requires further debridement. Severity of Tissue Post Debridement is: Fat layer exposed. Post procedure Diagnosis Wound #2: Same as Pre-Procedure Wound #4 Pre-procedure diagnosis of Wound #4 is a Skin T located on the Right,Dorsal Foot . There was a Chemical/Enzymatic/Mechanical debridement performed by ear Zandra Abts, RN.. Other agent used was Medihoney. There was no bleeding. The procedure was tolerated well. Post Debridement Measurements: 1.7cm length x 1.4cm width x 0.1cm depth; 0.187cm^3 volume. Character of Wound/Ulcer Post Debridement requires further debridement. Post procedure Diagnosis Wound #4: Same as Pre-Procedure Plan Follow-up Appointments: Return Appointment in 1 week. Dressing Change Frequency: Wound #1 Left,Distal,Anterior Lower Leg: Change Dressing every other day. Wound #2 Left,Lateral Ankle: Change Dressing every other day. Wound #4 Right,Dorsal Foot: Change Dressing every other day. Skin Barriers/Peri-Wound Care: Moisturizing lotion Wound Cleansing: May shower and wash wound with soap and water. - on days that dressing is changed Primary Wound Dressing: Wound #1 Left,Distal,Anterior Lower Leg: Medihoney Alginate Wound #2 Left,Lateral Ankle: Medihoney Alginate Wound #4 Right,Dorsal Foot: Medihoney Alginate Secondary Dressing: Wound #1 Left,Distal,Anterior  Lower Leg: Kerlix/Rolled Gauze Dry Gauze ABD pad Wound #2 Left,Lateral Ankle: Kerlix/Rolled Gauze Dry Gauze ABD pad Wound #4 Right,Dorsal Foot: Foam Border Edema Control: Avoid standing for long periods of time Elevate legs to the level of the heart or above for 30 minutes daily and/or when sitting, a frequency of: - throughout the day 1. Using Medihoney alginate to all wound areas 2. Looking forward to follow-up appointment with Dr. Randie Heinz on Friday. I suspect he will need some form of angiogram on the left leg 3. The area on the left anterior lower leg which is his largest wound actually looks somewhat better. I think he is going to need revascularization to have an attempt to close the areas on the ankle however. Electronic Signature(s) Signed: 11/12/2019 6:28:13 PM By: Zandra Abts RN, BSN Signed: 11/12/2019 8:16:21 PM By: Baltazar Najjar MD Entered By: Zandra Abts on 11/12/2019 17:38:35 -------------------------------------------------------------------------------- SuperBill Details Patient Name: Date of Service: THA [name], Matthew RGE T. 11/12/2019 Medical Record Number: [id number] Patient Account Number: [id number] Date of Birth/Sex: Treating RN: 1950-09-24 (69 y.o. 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 L97.518 Non-pressure  chronic ulcer of other part of right foot with other specified severity Facility Procedures CPT4 Code: 04540981 Description: [phone] - DEBRIDE W/O  ANES NON SELECT Modifier: Quantity: 1 Physician Procedures : CPT4 Code Description Modifier [id number] - WC PHYS LEVEL 3 - EST PT ICD-10 Diagnosis Description L97.828 Non-pressure chronic ulcer of other part of left lower leg with other specified severity L97.322 Non-pressure chronic ulcer of left ankle with  fat layer exposed L97.518 Non-pressure chronic ulcer of other part of right foot with other specified severity I70.242 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

## 2019-11-14 DIAGNOSIS — S91002A Unspecified open wound, left ankle, initial encounter: Secondary | ICD-10-CM | POA: Diagnosis not present

## 2019-11-14 DIAGNOSIS — S81802A Unspecified open wound, left lower leg, initial encounter: Secondary | ICD-10-CM | POA: Diagnosis not present

## 2019-11-14 DIAGNOSIS — S91301A Unspecified open wound, right foot, initial encounter: Secondary | ICD-10-CM | POA: Diagnosis not present

## 2019-11-15 NOTE — Progress Notes (Signed)
Matthew [name], Helix [name] ([id number]) . Visit Report for 11/12/2019 Arrival Information Details Patient Name: Date of Service: THA Matthew [name], Matthew [name]. 11/12/2019 2:00 PM Medical Record Number: [id number] Patient Account Number: [id number] Date of Birth/Sex: Treating RN: 08-15-1950 (69 y.o. Matthew [name]) [name], Matthew Primary Care Matthew [name]: PA TIENT, [name] Other Clinician: Referring Matthew [name]: Treating Matthew [name]/Extender: Matthew [name], Matthew [name], [place] RO LINE Weeks in Treatment: 11 Visit Information History Since Last Visit Added or deleted any medications: [name] Patient Arrived: Ambulatory Any new allergies or adverse reactions: [name] Arrival Time: 14:16 Had a fall or experienced change in [name] Accompanied By: self activities of daily living that may affect Transfer Assistance: None risk of falls: Patient Identification Verified: Yes Signs or symptoms of abuse/neglect since last visito [name] Secondary Verification Process Completed: Yes Hospitalized since last visit: [name] Patient Requires Transmission-Based Precautions: [name] Implantable device outside of the clinic excluding [name] Patient Has Alerts: [name] cellular tissue based products placed in the center since last visit: Has Dressing in Place as Prescribed: Yes Pain Present Now: [name] Electronic Signature(s) Signed: 11/15/2019 4:20:07 PM By: Matthew [name], Matthew [name] Entered By: Matthew [name], Matthew [name] on 11/12/2019 14:16:17 -------------------------------------------------------------------------------- Encounter Discharge Information Details Patient Name: Date of Service: THA Matthew [name], Matthew [name]. 11/12/2019 2:00 PM Medical Record Number: [id number] Patient Account Number: [id number] Date of Birth/Sex: Treating RN: 08-15-1950 (69 y.o. Matthew [name]) [name], Matthew Primary Care Talik Casique: PA Matthew [name] Other Clinician: Referring Svetlana Bagby: Treating Matthew [name]: Matthew [name], Matthew [name], [place] RO LINE Weeks in Treatment: 11 Encounter Discharge Information Items Discharge Condition: Stable Ambulatory  Status: Ambulatory Discharge Destination: Home Transportation: Private Auto Accompanied By: self Schedule Follow-up Appointment: Yes Clinical Summary of Care: Electronic Signature(s) Signed: 11/12/2019 5:09:44 PM By: Matthew [name], Matthew Entered By: Matthew [name], Matthew on 11/12/2019 16:00:03 -------------------------------------------------------------------------------- Lower Extremity Assessment Details Patient Name: Date of Service: THA Matthew [name], Matthew [name]. 11/12/2019 2:00 PM Medical Record Number: [id number] Patient Account Number: [id number] Date of Birth/Sex: Treating RN: 08-15-1950 (69 y.o. Matthew [name]) [name], Matthew Primary Care Kinleigh Nault: PA Matthew [name] Other Clinician: Referring Matthew [name]: Treating Matthew [name]/Extender: Matthew [name], Matthew [name], [place] RO LINE Weeks in Treatment: 11 Edema Assessment Assessed: [Left: Yes] [Right: Yes] Edema: [Left: Yes] [Right: Yes] Calf Left: Right: Point of Measurement: 41 cm From Medial Instep 27 cm 29 cm Ankle Left: Right: Point of Measurement: 10 cm From Medial Instep 22 cm 22 cm Vascular Assessment Pulses: Dorsalis Pedis Palpable: [Left:Yes] [Right:Yes] Electronic Signature(s) Signed: 11/12/2019 5:09:44 PM By: Matthew [name], Matthew Entered By: Matthew [name], Matthew on 11/12/2019 14:26:17 -------------------------------------------------------------------------------- Multi Wound Chart Details Patient Name: Date of Service: THA Matthew [name], Matthew [name]. 11/12/2019 2:00 PM Medical Record Number: [id number] Patient Account Number: [id number] Date of Birth/Sex: Treating RN: 08-15-1950 (69 y.o. Matthew [name]) [name], Matthew Primary Care Dollene Mallery: PA TIENT, [name] Other Clinician: Referring Matthew [name]: Treating Matthew [name]/Extender: Matthew [name], Matthew [name], [place] RO LINE Weeks in Treatment: 11 Vital Signs Height(in): 72 Pulse(bpm): 82 Weight(lbs): 130 Blood Pressure(mmHg): 122/64 Body Mass Index(BMI): 18 Temperature(F): 97.7 Respiratory Rate(breaths/min): 18 Photos: [1:[name] Photos Left, Distal, Anterior  Lower Leg] [2:[name] Photos Left, Lateral Ankle] [3:[name] Photos Left, Dorsal Foot] Wound Location: [1:Trauma] [2:Blister] [3:Gradually Appeared] Wounding Event: [1:Arterial Insufficiency Ulcer] [2:Arterial Insufficiency Ulcer] [3:Arterial Insufficiency Ulcer] Primary Etiology: [1:Peripheral Arterial Disease] [2:Peripheral Arterial Disease] [3:[name]/A] Comorbid History: [1:07/08/2018] [2:08/06/2019] [3:09/17/2019] Date Acquired: [1:11] [2:11] [3:8] Weeks of Treatment: [1:Open] [2:Open] [3:Healed - Epithelialized] Wound Status: [1:9x3.2x0.2] [2:1.5x1.1x0.3] [3:0x0x0] Measurements L x W x D (cm) [1:22.619] [2:1.296] [3:0] A (cm) : rea [1:4.524] [2:0.389] [3:0] Volume (cm) : [  1:-9.70%] [2:-371.30%] [3:100.00%] % Reduction in Area: [1:-9.70%] [2:-1340.70%] [3:100.00%] % Reduction in Volume: [1:Full Thickness Without Exposed] [2:Full Thickness Without Exposed] [3:Full Thickness Without Exposed] Classification: [1:Support Structures Medium] [2:Support Structures Medium] [3:Support Structures [name]/A] Exudate Amount: [1:Serosanguineous] [2:Serosanguineous] [3:[name]/A] Exudate Type: [1:red, brown] [2:red, brown] [3:[name]/A] Exudate Color: [1:Distinct, outline attached] [2:Distinct, outline attached] [3:[name]/A] Wound Margin: [1:Large (67-100%)] [2:Medium (34-66%)] [3:[name]/A] Granulation Amount: [1:Red, Pink] [2:Red, Pink] [3:[name]/A] Granulation Quality: [1:Small (1-33%)] [2:Medium (34-66%)] [3:[name]/A] Necrotic Amount: [1:Fat Layer (Subcutaneous Tissue)] [2:Fat Layer (Subcutaneous Tissue)] [3:[name]/A] Exposed Structures: [1:Exposed: Yes Fascia: [name] Tendon: [name] Muscle: [name] Joint: [name] Bone: [name] Small (1-33%)] [2:Exposed: Yes Fascia: [name] Tendon: [name] Muscle: [name] Joint: [name] Bone: [name] Small (1-33%)] [3:[name]/A] Wound Number: 4 [name]/A [name]/A Photos: [name] Photos [name]/A [name]/A Right, Dorsal Foot [name]/A [name]/A Wound Location: Blister [name]/A [name]/A Wounding Event: Skin [name] ear [name]/A [name]/A Primary Etiology: Peripheral Arterial Disease [name]/A [name]/A Comorbid History: 09/24/2019 [name]/A [name]/A Date  Acquired: 7 [name]/A [name]/A Weeks of Treatment: Open [name]/A [name]/A Wound Status: 1.7x1.4x0.1 [name]/A [name]/A Measurements L x W x D (cm) 1.869 [name]/A [name]/A A (cm) : rea 0.187 [name]/A [name]/A Volume (cm) : 61.90% [name]/A [name]/A % Reduction in A rea: 61.90% [name]/A [name]/A % Reduction in Volume: Full Thickness Without Exposed [name]/A [name]/A Classification: Support Structures Medium [name]/A [name]/A Exudate Amount: [name]/A [name]/A [name]/A Exudate Type: [name]/A [name]/A [name]/A Exudate Color: Distinct, outline attached [name]/A [name]/A Wound Margin: Medium (34-66%) [name]/A [name]/A Granulation Amount: Red, Pink [name]/A [name]/A Granulation Quality: Medium (34-66%) [name]/A [name]/A Necrotic Amount: Fat Layer (Subcutaneous Tissue) [name]/A [name]/A Exposed Structures: Exposed: Yes Fascia: [name] Tendon: [name] Muscle: [name] Joint: [name] Bone: [name] Small (1-33%) [name]/A [name]/A Epithelialization: Treatment Notes Wound #1 (Left, Distal, Anterior Lower Leg) 1. Cleanse With Wound Cleanser Soap and water 3. Primary Dressing Applied Calcium Alginate Other primary dressing (specifiy in notes) 4. Secondary Dressing ABD Pad Roll Gauze 5. Secured With Medipore tape Notes primary dressing medihoney alginate. netting. Wound #2 (Left, Lateral Ankle) 1. Cleanse With Wound Cleanser Soap and water 2. Periwound Care Skin Prep 3. Primary Dressing Applied Calcium Alginate Other primary dressing (specifiy in notes) 4. Secondary Dressing Foam Border Dressing 5. Secured With Self Adhesive Bandage Notes primary dressing medihoney alginate. Wound #4 (Right, Dorsal Foot) 1. Cleanse With Wound Cleanser Soap and water 2. Periwound Care Skin Prep 3. Primary Dressing Applied Calcium Alginate Other primary dressing (specifiy in notes) 4. Secondary Dressing Foam Border Dressing 5. Secured With Self Adhesive Bandage Notes primary dressing medihoney alginate. Electronic Signature(s) Signed: 11/12/2019 6:28:13 PM By: Zandra Abts RN, BSN Signed: 11/12/2019 8:16:21 PM By: Baltazar Najjar MD Entered By: Baltazar Najjar on  11/12/2019 16:46:02 -------------------------------------------------------------------------------- Multi-Disciplinary Care Plan Details Patient Name: Date of Service: THA Matthew Cipro, Matthew [name]. 11/12/2019 2:00 PM Medical Record Number: [id number] Patient Account Number: [id number] Date of Birth/Sex: Treating RN: 12/04/1950 (69 y.o. Matthew Koller Primary Care Jakaiden Fill: PA Matthew Jordan, [name] Other Clinician: Referring Cloys Vera: Treating Ramonia Mcclaran/Extender: Matthew Sheldon, [place] RO LINE Weeks in Treatment: 11 Active Inactive Wound/Skin Impairment Nursing Diagnoses: Impaired tissue integrity Knowledge deficit related to ulceration/compromised skin integrity Goals: Patient/caregiver will verbalize understanding of skin care regimen Date Initiated: 08/27/2019 Target Resolution Date: 11/30/2019 Goal Status: Active Ulcer/skin breakdown will have a volume reduction of 30% by week 4 Date Initiated: 08/27/2019 Date Inactivated: 09/24/2019 Target Resolution Date: 09/28/2019 Unmet Reason: PAD, non viable Goal Status: Unmet surface Interventions: Assess patient/caregiver ability to obtain necessary supplies Assess patient/caregiver ability to perform ulcer/skin care regimen upon admission and  as needed Assess ulceration(s) every visit Provide education on ulcer and skin care Notes: Electronic Signature(s) Signed: 11/12/2019 6:28:13 PM By: Levan Hurst RN, BSN Entered By: Levan Hurst on 11/12/2019 17:59:32 -------------------------------------------------------------------------------- Pain Assessment Details Patient Name: Date of Service: THA Duard Larsen, Matthew [name]. 11/12/2019 2:00 PM Medical Record Number: [id number] Patient Account Number: [id number] Date of Birth/Sex: Treating RN: 06-21-1950 (68 y.o. [name] Primary Care Deanna Wiater: PA Haig Prophet, [name] Other Clinician: Referring Hubbard Seldon: Treating Karalee Hauter/Extender: Lehman Prom, [place] RO LINE Weeks in Treatment: 11 Active  Problems Location of Pain Severity and Description of Pain Patient Has Paino [name] Site Locations Pain Management and Medication Current Pain Management: Electronic Signature(s) Signed: 11/12/2019 6:28:13 PM By: Levan Hurst RN, BSN Signed: 11/15/2019 4:20:07 PM By: Sandre Kitty Entered By: Sandre Kitty on 11/12/2019 14:16:47 -------------------------------------------------------------------------------- Patient/Caregiver Education Details Patient Name: Date of Service: THA Duard Larsen, Matthew [name]. 6/7/2021andnbsp2:00 PM Medical Record Number: [id number] Patient Account Number: [id number] Date of Birth/Gender: Treating RN: 26-Mar-1951 (69 y.o. [name] Primary Care Physician: PA Haig Prophet, [name] Other Clinician: Referring Physician: Treating Physician/Extender: Lehman Prom, [place] RO LINE Weeks in Treatment: 11 Education Assessment Education Provided To: Patient Education Topics Provided Wound/Skin Impairment: Methods: Explain/Verbal Responses: State content correctly Electronic Signature(s) Signed: 11/12/2019 6:28:13 PM By: Levan Hurst RN, BSN Entered By: Levan Hurst on 11/12/2019 17:59:41 -------------------------------------------------------------------------------- Wound Assessment Details Patient Name: Date of Service: THA Duard Larsen, Matthew [name]. 11/12/2019 2:00 PM Medical Record Number: [id number] Patient Account Number: [id number] Date of Birth/Sex: Treating RN: 1950/07/27 (69 y.o. Hessie Diener Primary Care Yuvia Plant: PA TIENT, [name] Other Clinician: Referring Kaidence Callaway: Treating Neel Buffone/Extender: Lehman Prom, [place] RO LINE Weeks in Treatment: 11 Wound Status Wound Number: 1 Primary Etiology: Arterial Insufficiency Ulcer Wound Location: Left, Distal, Anterior Lower Leg Wound Status: Open Wounding Event: Trauma Comorbid History: Peripheral Arterial Disease Date Acquired: 07/08/2018 Weeks Of Treatment: 11 Clustered Wound: [name] Photos Photo  Uploaded By: Mikeal Hawthorne on 11/13/2019 10:25:24 Wound Measurements Length: (cm) 9 Width: (cm) 3.2 Depth: (cm) 0.2 Area: (cm) 22.619 Volume: (cm) 4.524 % Reduction in Area: -9.7% % Reduction in Volume: -9.7% Epithelialization: Small (1-33%) Tunneling: [name] Undermining: [name] Wound Description Classification: Full Thickness Without Exposed Support Structures Wound Margin: Distinct, outline attached Exudate Amount: Medium Exudate Type: Serosanguineous Exudate Color: red, brown Foul Odor After Cleansing: [name] Slough/Fibrino Yes Wound Bed Granulation Amount: Large (67-100%) Exposed Structure Granulation Quality: Red, Pink Fascia Exposed: [name] Necrotic Amount: Small (1-33%) Fat Layer (Subcutaneous Tissue) Exposed: Yes Necrotic Quality: Adherent Slough Tendon Exposed: [name] Muscle Exposed: [name] Joint Exposed: [name] Bone Exposed: [name] Treatment Notes Wound #1 (Left, Distal, Anterior Lower Leg) 1. Cleanse With Wound Cleanser Soap and water 3. Primary Dressing Applied Calcium Alginate Other primary dressing (specifiy in notes) 4. Secondary Dressing ABD Pad Roll Gauze 5. Secured With Medipore tape Notes primary dressing medihoney alginate. netting. Electronic Signature(s) Signed: 11/12/2019 5:09:44 PM By: Deon Pilling Entered By: Deon Pilling on 11/12/2019 14:28:15 -------------------------------------------------------------------------------- Wound Assessment Details Patient Name: Date of Service: [place], Matthew [name]. 11/12/2019 2:00 PM Medical Record Number: [id number] Patient Account Number: [id number] Date of Birth/Sex: Treating RN: 1950/09/02 (69 y.o. Hessie Diener Primary Care Jiyah Torpey: PA Haig Prophet, [name] Other Clinician: Referring Piper Albro: Treating Norita Meigs/Extender: Lehman Prom, [place] RO LINE Weeks in Treatment: 11 Wound Status Wound Number: 2 Primary Etiology: Arterial Insufficiency Ulcer Wound Location: Left, Lateral Ankle Wound Status: Open Wounding  Event: Blister Comorbid History:  Peripheral Arterial Disease Date Acquired: 08/06/2019 Weeks Of Treatment: 11 Clustered Wound: [name] Photos Photo Uploaded By: Benjaman Kindler on 11/13/2019 10:26:05 Wound Measurements Length: (cm) 1.5 Width: (cm) 1.1 Depth: (cm) 0.3 Area: (cm) 1.296 Volume: (cm) 0.389 % Reduction in Area: -371.3% % Reduction in Volume: -1340.7% Epithelialization: Small (1-33%) Tunneling: [name] Undermining: [name] Wound Description Classification: Full Thickness Without Exposed Support Structures Wound Margin: Distinct, outline attached Exudate Amount: Medium Exudate Type: Serosanguineous Exudate Color: red, brown Foul Odor After Cleansing: [name] Slough/Fibrino Yes Wound Bed Granulation Amount: Medium (34-66%) Exposed Structure Granulation Quality: Red, Pink Fascia Exposed: [name] Necrotic Amount: Medium (34-66%) Fat Layer (Subcutaneous Tissue) Exposed: Yes Tendon Exposed: [name] Muscle Exposed: [name] Joint Exposed: [name] Bone Exposed: [name] Treatment Notes Wound #2 (Left, Lateral Ankle) 1. Cleanse With Wound Cleanser Soap and water 2. Periwound Care Skin Prep 3. Primary Dressing Applied Calcium Alginate Other primary dressing (specifiy in notes) 4. Secondary Dressing Foam Border Dressing 5. Secured With Self Adhesive Bandage Notes primary dressing medihoney alginate. Electronic Signature(s) Signed: 11/12/2019 5:09:44 PM By: Matthew Stall Entered By: Matthew Stall on 11/12/2019 14:27:43 -------------------------------------------------------------------------------- Wound Assessment Details Patient Name: Date of Service: THA Matthew Cipro, Matthew [name]. 11/12/2019 2:00 PM Medical Record Number: [id number] Patient Account Number: [id number] Date of Birth/Sex: Treating RN: 06-20-1950 (69 y.o. Matthew Sours Primary Care Margarie Mcguirt: PA Matthew Jordan, [name] Other Clinician: Referring Junious Ragone: Treating Lyndie Vanderloop/Extender: Matthew Sheldon, [place] RO LINE Weeks in Treatment: 11 Wound  Status Wound Number: 3 Primary Etiology: Arterial Insufficiency Ulcer Wound Location: Left, Dorsal Foot Wound Status: Healed - Epithelialized Wounding Event: Gradually Appeared Date Acquired: 09/17/2019 Weeks Of Treatment: 8 Clustered Wound: [name] Photos Photo Uploaded By: Benjaman Kindler on 11/13/2019 10:25:47 Wound Measurements Length: (cm) Width: (cm) Depth: (cm) Area: (cm) Volume: (cm) 0 % Reduction in Area: 100% 0 % Reduction in Volume: 100% 0 0 0 Wound Description Classification: Full Thickness Without Exposed Support Structur es Electronic Signature(s) Signed: 11/12/2019 5:09:44 PM By: Matthew Stall Entered By: Matthew Stall on 11/12/2019 14:28:26 -------------------------------------------------------------------------------- Wound Assessment Details Patient Name: Date of Service: THA Matthew Cipro, Matthew [name]. 11/12/2019 2:00 PM Medical Record Number: [id number] Patient Account Number: [id number] Date of Birth/Sex: Treating RN: 1950-07-21 (69 y.o. Matthew Sours Primary Care Rafiq Bucklin: PA TIENT, [name] Other Clinician: Referring Lavilla Delamora: Treating Whitleigh Garramone/Extender: Matthew Sheldon, [place] RO LINE Weeks in Treatment: 11 Wound Status Wound Number: 4 Primary Etiology: Skin Tear Wound Location: Right, Dorsal Foot Wound Status: Open Wounding Event: Blister Comorbid History: Peripheral Arterial Disease Date Acquired: 09/24/2019 Weeks Of Treatment: 7 Clustered Wound: [name] Photos Photo Uploaded By: Benjaman Kindler on 11/13/2019 10:24:52 Wound Measurements Length: (cm) 1.7 Width: (cm) 1.4 Depth: (cm) 0.1 Area: (cm) 1.869 Volume: (cm) 0.187 % Reduction in Area: 61.9% % Reduction in Volume: 61.9% Epithelialization: Small (1-33%) Tunneling: [name] Undermining: [name] Wound Description Classification: Full Thickness Without Exposed Support Structures Wound Margin: Distinct, outline attached Exudate Amount: Medium Foul Odor After Cleansing: [name] Slough/Fibrino Yes Wound  Bed Granulation Amount: Medium (34-66%) Exposed Structure Granulation Quality: Red, Pink Fascia Exposed: [name] Necrotic Amount: Medium (34-66%) Fat Layer (Subcutaneous Tissue) Exposed: Yes Necrotic Quality: Adherent Slough Tendon Exposed: [name] Muscle Exposed: [name] Joint Exposed: [name] Bone Exposed: [name] Treatment Notes Wound #4 (Right, Dorsal Foot) 1. Cleanse With Wound Cleanser Soap and water 2. Periwound Care Skin Prep 3. Primary Dressing Applied Calcium Alginate Other primary dressing (specifiy in notes) 4. Secondary Dressing Foam Border Dressing 5. Secured With Self Adhesive Bandage Notes primary dressing medihoney  alginate. Electronic Signature(s) Signed: 11/12/2019 5:09:44 PM By: Matthew Stall Entered By: Matthew Stall on 11/12/2019 14:28:55 -------------------------------------------------------------------------------- Vitals Details Patient Name: Date of Service: THA Matthew Cipro, Matthew [name]. 11/12/2019 2:00 PM Medical Record Number: [id number] Patient Account Number: [id number] Date of Birth/Sex: Treating RN: 1951-04-02 (69 y.o. Matthew Koller Primary Care Jameila Keeny: PA TIENT, [name] Other Clinician: Referring Kourosh Jablonsky: Treating Destyne Goodreau/Extender: Matthew Sheldon, [place] RO LINE Weeks in Treatment: 11 Vital Signs Time Taken: 14:16 Temperature (F): 97.7 Height (in): 72 Pulse (bpm): 82 Weight (lbs): 130 Respiratory Rate (breaths/min): 18 Body Mass Index (BMI): 17.6 Blood Pressure (mmHg): 122/64 Reference Range: 80 - 120 mg / dl Electronic Signature(s) Signed: 11/15/2019 4:20:07 PM By: Matthew Ito Entered By: Matthew Ito on 11/12/2019 14:16:41

## 2019-11-16 ENCOUNTER — Ambulatory Visit: Payer: Medicare HMO | Admitting: Vascular Surgery

## 2019-11-16 DIAGNOSIS — S81802A Unspecified open wound, left lower leg, initial encounter: Secondary | ICD-10-CM | POA: Diagnosis not present

## 2019-11-16 DIAGNOSIS — S91002A Unspecified open wound, left ankle, initial encounter: Secondary | ICD-10-CM | POA: Diagnosis not present

## 2019-11-16 DIAGNOSIS — S91301A Unspecified open wound, right foot, initial encounter: Secondary | ICD-10-CM | POA: Diagnosis not present

## 2019-11-19 ENCOUNTER — Encounter (HOSPITAL_BASED_OUTPATIENT_CLINIC_OR_DEPARTMENT_OTHER): Payer: Medicare HMO | Admitting: Physician Assistant

## 2019-11-19 ENCOUNTER — Telehealth: Payer: Self-pay

## 2019-11-19 DIAGNOSIS — R6889 Other general symptoms and signs: Secondary | ICD-10-CM | POA: Diagnosis not present

## 2019-11-19 NOTE — Telephone Encounter (Signed)
Called pt to schedule his procedure and he states he is sob and has been experiencing this for several weeks. He does not have a PCP. I gave him 2 [facility] affiliated PCP offices to call to make an appt. He then states he feels as if he is having a heart attack. I told him he needs to call 911 and go to the ED. He stated he can't, as he is watching his roommate's dog. We discussed the importance of going to the ED immediately. Pt verbalized understanding.

## 2019-11-22 ENCOUNTER — Ambulatory Visit (INDEPENDENT_AMBULATORY_CARE_PROVIDER_SITE_OTHER): Payer: Medicare HMO | Admitting: Adult Health

## 2019-11-22 ENCOUNTER — Other Ambulatory Visit: Payer: Self-pay

## 2019-11-22 ENCOUNTER — Encounter: Payer: Self-pay | Admitting: Adult Health

## 2019-11-22 VITALS — BP 127/76 | HR 103 | Ht 72.0 in | Wt 118.0 lb

## 2019-11-22 DIAGNOSIS — M47812 Spondylosis without myelopathy or radiculopathy, cervical region: Secondary | ICD-10-CM | POA: Diagnosis not present

## 2019-11-22 DIAGNOSIS — R6889 Other general symptoms and signs: Secondary | ICD-10-CM | POA: Diagnosis not present

## 2019-11-22 MED ORDER — PREGABALIN 100 MG PO CAPS: 100.0000 mg | ORAL_CAPSULE | Freq: Every day | ORAL | 1 refills | Status: DC

## 2019-11-22 MED ORDER — DIAZEPAM 5 MG PO TABS: ORAL_TABLET | ORAL | 1 refills | Status: DC

## 2019-11-22 NOTE — Patient Instructions (Signed)
Your Plan:  Continue Valium and Lyrica If your symptoms worsen or you develop new symptoms please let us know.    Thank you for coming to see [place] at [facility]. I hope we have been able to provide you high quality care today.  You may receive a patient satisfaction survey over the next few weeks. We would appreciate your feedback and comments so that we may continue to improve ourselves and the health of our patients.
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
## 2019-11-23 NOTE — Progress Notes (Signed)
I have read the note, and I agree with the clinical assessment and plan.  Matthew [name] K Matthew [name]   

## 2019-11-26 ENCOUNTER — Encounter (HOSPITAL_BASED_OUTPATIENT_CLINIC_OR_DEPARTMENT_OTHER): Payer: Medicare HMO | Admitting: Internal Medicine

## 2019-11-26 DIAGNOSIS — R6889 Other general symptoms and signs: Secondary | ICD-10-CM | POA: Diagnosis not present

## 2019-11-30 ENCOUNTER — Ambulatory Visit: Payer: Medicare HMO | Admitting: Vascular Surgery

## 2019-12-02 ENCOUNTER — Emergency Department (HOSPITAL_COMMUNITY): Payer: Medicare HMO

## 2019-12-02 ENCOUNTER — Inpatient Hospital Stay (HOSPITAL_COMMUNITY)
Admission: RE | Admit: 2019-12-02 | Discharge: 2019-12-06 | DRG: 177 | Disposition: A | Discharge status: Hospice | Payer: Medicare HMO | Attending: Family Medicine | Admitting: Family Medicine

## 2019-12-02 DIAGNOSIS — S3991XA Unspecified injury of abdomen, initial encounter: Secondary | ICD-10-CM | POA: Diagnosis not present

## 2019-12-02 DIAGNOSIS — E8809 Other disorders of plasma-protein metabolism, not elsewhere classified: Secondary | ICD-10-CM | POA: Diagnosis not present

## 2019-12-02 DIAGNOSIS — W208XXA Other cause of strike by thrown, projected or falling object, initial encounter: Secondary | ICD-10-CM | POA: Diagnosis present

## 2019-12-02 DIAGNOSIS — M79672 Pain in left foot: Secondary | ICD-10-CM | POA: Diagnosis present

## 2019-12-02 DIAGNOSIS — J9811 Atelectasis: Secondary | ICD-10-CM | POA: Diagnosis present

## 2019-12-02 DIAGNOSIS — G8929 Other chronic pain: Secondary | ICD-10-CM | POA: Diagnosis present

## 2019-12-02 DIAGNOSIS — E43 Unspecified severe protein-calorie malnutrition: Secondary | ICD-10-CM | POA: Diagnosis present

## 2019-12-02 DIAGNOSIS — R188 Other ascites: Secondary | ICD-10-CM | POA: Diagnosis present

## 2019-12-02 DIAGNOSIS — Z7189 Other specified counseling: Secondary | ICD-10-CM | POA: Diagnosis not present

## 2019-12-02 DIAGNOSIS — K8020 Calculus of gallbladder without cholecystitis without obstruction: Secondary | ICD-10-CM | POA: Diagnosis present

## 2019-12-02 DIAGNOSIS — F39 Unspecified mood [affective] disorder: Secondary | ICD-10-CM | POA: Diagnosis present

## 2019-12-02 DIAGNOSIS — I340 Nonrheumatic mitral (valve) insufficiency: Secondary | ICD-10-CM | POA: Diagnosis not present

## 2019-12-02 DIAGNOSIS — L97519 Non-pressure chronic ulcer of other part of right foot with unspecified severity: Secondary | ICD-10-CM | POA: Diagnosis present

## 2019-12-02 DIAGNOSIS — I351 Nonrheumatic aortic (valve) insufficiency: Secondary | ICD-10-CM | POA: Diagnosis not present

## 2019-12-02 DIAGNOSIS — Z91041 Radiographic dye allergy status: Secondary | ICD-10-CM

## 2019-12-02 DIAGNOSIS — R7989 Other specified abnormal findings of blood chemistry: Secondary | ICD-10-CM | POA: Diagnosis not present

## 2019-12-02 DIAGNOSIS — J690 Pneumonitis due to inhalation of food and vomit: Secondary | ICD-10-CM | POA: Diagnosis not present

## 2019-12-02 DIAGNOSIS — Z681 Body mass index (BMI) 19 or less, adult: Secondary | ICD-10-CM

## 2019-12-02 DIAGNOSIS — N179 Acute kidney failure, unspecified: Secondary | ICD-10-CM | POA: Diagnosis not present

## 2019-12-02 DIAGNOSIS — R634 Abnormal weight loss: Secondary | ICD-10-CM | POA: Diagnosis present

## 2019-12-02 DIAGNOSIS — Z79899 Other long term (current) drug therapy: Secondary | ICD-10-CM

## 2019-12-02 DIAGNOSIS — Z66 Do not resuscitate: Secondary | ICD-10-CM | POA: Diagnosis not present

## 2019-12-02 DIAGNOSIS — R791 Abnormal coagulation profile: Secondary | ICD-10-CM | POA: Diagnosis not present

## 2019-12-02 DIAGNOSIS — L97929 Non-pressure chronic ulcer of unspecified part of left lower leg with unspecified severity: Secondary | ICD-10-CM | POA: Diagnosis present

## 2019-12-02 DIAGNOSIS — I10 Essential (primary) hypertension: Secondary | ICD-10-CM | POA: Diagnosis not present

## 2019-12-02 DIAGNOSIS — R4189 Other symptoms and signs involving cognitive functions and awareness: Secondary | ICD-10-CM | POA: Diagnosis not present

## 2019-12-02 DIAGNOSIS — Z01810 Encounter for preprocedural cardiovascular examination: Secondary | ICD-10-CM | POA: Diagnosis not present

## 2019-12-02 DIAGNOSIS — I491 Atrial premature depolarization: Secondary | ICD-10-CM | POA: Diagnosis not present

## 2019-12-02 DIAGNOSIS — J9601 Acute respiratory failure with hypoxia: Secondary | ICD-10-CM | POA: Diagnosis present

## 2019-12-02 DIAGNOSIS — R404 Transient alteration of awareness: Secondary | ICD-10-CM | POA: Diagnosis not present

## 2019-12-02 DIAGNOSIS — Z20822 Contact with and (suspected) exposure to covid-19: Secondary | ICD-10-CM | POA: Diagnosis not present

## 2019-12-02 DIAGNOSIS — R4182 Altered mental status, unspecified: Secondary | ICD-10-CM

## 2019-12-02 DIAGNOSIS — D696 Thrombocytopenia, unspecified: Secondary | ICD-10-CM | POA: Diagnosis present

## 2019-12-02 DIAGNOSIS — K7689 Other specified diseases of liver: Secondary | ICD-10-CM | POA: Diagnosis present

## 2019-12-02 DIAGNOSIS — A419 Sepsis, unspecified organism: Secondary | ICD-10-CM | POA: Diagnosis present

## 2019-12-02 DIAGNOSIS — S3993XA Unspecified injury of pelvis, initial encounter: Secondary | ICD-10-CM | POA: Diagnosis not present

## 2019-12-02 DIAGNOSIS — I447 Left bundle-branch block, unspecified: Secondary | ICD-10-CM | POA: Diagnosis present

## 2019-12-02 DIAGNOSIS — I5021 Acute systolic (congestive) heart failure: Secondary | ICD-10-CM | POA: Diagnosis not present

## 2019-12-02 DIAGNOSIS — I083 Combined rheumatic disorders of mitral, aortic and tricuspid valves: Secondary | ICD-10-CM | POA: Diagnosis present

## 2019-12-02 DIAGNOSIS — K7210 Chronic hepatic failure without coma: Secondary | ICD-10-CM | POA: Diagnosis present

## 2019-12-02 DIAGNOSIS — R532 Functional quadriplegia: Secondary | ICD-10-CM | POA: Diagnosis present

## 2019-12-02 DIAGNOSIS — I472 Ventricular tachycardia: Secondary | ICD-10-CM | POA: Diagnosis present

## 2019-12-02 DIAGNOSIS — Z7401 Bed confinement status: Secondary | ICD-10-CM | POA: Diagnosis not present

## 2019-12-02 DIAGNOSIS — F0390 Unspecified dementia without behavioral disturbance: Secondary | ICD-10-CM | POA: Diagnosis present

## 2019-12-02 DIAGNOSIS — R52 Pain, unspecified: Secondary | ICD-10-CM | POA: Diagnosis not present

## 2019-12-02 DIAGNOSIS — I429 Cardiomyopathy, unspecified: Secondary | ICD-10-CM | POA: Diagnosis not present

## 2019-12-02 DIAGNOSIS — S2242XA Multiple fractures of ribs, left side, initial encounter for closed fracture: Secondary | ICD-10-CM

## 2019-12-02 DIAGNOSIS — R1312 Dysphagia, oropharyngeal phase: Secondary | ICD-10-CM | POA: Diagnosis present

## 2019-12-02 DIAGNOSIS — I70234 Atherosclerosis of native arteries of right leg with ulceration of heel and midfoot: Secondary | ICD-10-CM | POA: Diagnosis not present

## 2019-12-02 DIAGNOSIS — M549 Dorsalgia, unspecified: Secondary | ICD-10-CM | POA: Diagnosis present

## 2019-12-02 DIAGNOSIS — M6281 Muscle weakness (generalized): Secondary | ICD-10-CM

## 2019-12-02 DIAGNOSIS — K08109 Complete loss of teeth, unspecified cause, unspecified class: Secondary | ICD-10-CM | POA: Diagnosis not present

## 2019-12-02 DIAGNOSIS — R7401 Elevation of levels of liver transaminase levels: Secondary | ICD-10-CM | POA: Diagnosis present

## 2019-12-02 DIAGNOSIS — R627 Adult failure to thrive: Secondary | ICD-10-CM

## 2019-12-02 DIAGNOSIS — K7200 Acute and subacute hepatic failure without coma: Secondary | ICD-10-CM | POA: Diagnosis present

## 2019-12-02 DIAGNOSIS — R5381 Other malaise: Secondary | ICD-10-CM | POA: Diagnosis not present

## 2019-12-02 DIAGNOSIS — Z7982 Long term (current) use of aspirin: Secondary | ICD-10-CM

## 2019-12-02 DIAGNOSIS — R410 Disorientation, unspecified: Secondary | ICD-10-CM | POA: Diagnosis not present

## 2019-12-02 DIAGNOSIS — J189 Pneumonia, unspecified organism: Secondary | ICD-10-CM

## 2019-12-02 DIAGNOSIS — I739 Peripheral vascular disease, unspecified: Secondary | ICD-10-CM | POA: Diagnosis present

## 2019-12-02 DIAGNOSIS — Z515 Encounter for palliative care: Secondary | ICD-10-CM

## 2019-12-02 DIAGNOSIS — Z8249 Family history of ischemic heart disease and other diseases of the circulatory system: Secondary | ICD-10-CM

## 2019-12-02 DIAGNOSIS — Z8673 Personal history of transient ischemic attack (TIA), and cerebral infarction without residual deficits: Secondary | ICD-10-CM

## 2019-12-02 DIAGNOSIS — K769 Liver disease, unspecified: Secondary | ICD-10-CM

## 2019-12-02 DIAGNOSIS — M47812 Spondylosis without myelopathy or radiculopathy, cervical region: Secondary | ICD-10-CM | POA: Diagnosis present

## 2019-12-02 DIAGNOSIS — L97909 Non-pressure chronic ulcer of unspecified part of unspecified lower leg with unspecified severity: Secondary | ICD-10-CM | POA: Diagnosis present

## 2019-12-02 DIAGNOSIS — S2249XA Multiple fractures of ribs, unspecified side, initial encounter for closed fracture: Secondary | ICD-10-CM | POA: Diagnosis present

## 2019-12-02 DIAGNOSIS — Z885 Allergy status to narcotic agent status: Secondary | ICD-10-CM

## 2019-12-02 DIAGNOSIS — R079 Chest pain, unspecified: Secondary | ICD-10-CM | POA: Diagnosis not present

## 2019-12-02 DIAGNOSIS — E861 Hypovolemia: Secondary | ICD-10-CM | POA: Diagnosis present

## 2019-12-02 DIAGNOSIS — I70243 Atherosclerosis of native arteries of left leg with ulceration of ankle: Secondary | ICD-10-CM | POA: Diagnosis not present

## 2019-12-02 DIAGNOSIS — M2550 Pain in unspecified joint: Secondary | ICD-10-CM | POA: Diagnosis not present

## 2019-12-02 DIAGNOSIS — Z880 Allergy status to penicillin: Secondary | ICD-10-CM

## 2019-12-02 DIAGNOSIS — I50810 Right heart failure, unspecified: Secondary | ICD-10-CM | POA: Diagnosis present

## 2019-12-02 DIAGNOSIS — R4020 Unspecified coma: Secondary | ICD-10-CM | POA: Diagnosis not present

## 2019-12-02 DIAGNOSIS — Z87891 Personal history of nicotine dependence: Secondary | ICD-10-CM

## 2019-12-02 DIAGNOSIS — I361 Nonrheumatic tricuspid (valve) insufficiency: Secondary | ICD-10-CM | POA: Diagnosis not present

## 2019-12-02 DIAGNOSIS — B179 Acute viral hepatitis, unspecified: Secondary | ICD-10-CM

## 2019-12-02 DIAGNOSIS — D72829 Elevated white blood cell count, unspecified: Secondary | ICD-10-CM | POA: Diagnosis not present

## 2019-12-02 DIAGNOSIS — I714 Abdominal aortic aneurysm, without rupture: Secondary | ICD-10-CM | POA: Diagnosis present

## 2019-12-02 DIAGNOSIS — R296 Repeated falls: Secondary | ICD-10-CM | POA: Diagnosis present

## 2019-12-02 LAB — CBC
HCT: 45.4 % (ref 39.0–52.0)
Hemoglobin: 13.9 g/dL (ref 13.0–17.0)
MCH: 28.4 pg (ref 26.0–34.0)
MCHC: 30.6 g/dL (ref 30.0–36.0)
MCV: 92.7 fL (ref 80.0–100.0)
Platelets: 127 10*3/uL — ABNORMAL LOW (ref 150–400)
RBC: 4.9 MIL/uL (ref 4.22–5.81)
RDW: 19.4 % — ABNORMAL HIGH (ref 11.5–15.5)
WBC: 9.5 10*3/uL (ref 4.0–10.5)
nRBC: 0.3 % — ABNORMAL HIGH (ref 0.0–0.2)

## 2019-12-02 LAB — CBG MONITORING, ED: Glucose-Capillary: 82 mg/dL (ref 70–99)

## 2019-12-02 LAB — COMPREHENSIVE METABOLIC PANEL
ALT: 164 U/L — ABNORMAL HIGH (ref 0–44)
AST: 105 U/L — ABNORMAL HIGH (ref 15–41)
Albumin: 3.1 g/dL — ABNORMAL LOW (ref 3.5–5.0)
Alkaline Phosphatase: 118 U/L (ref 38–126)
Anion gap: 16 — ABNORMAL HIGH (ref 5–15)
BUN: 79 mg/dL — ABNORMAL HIGH (ref 8–23)
CO2: 19 mmol/L — ABNORMAL LOW (ref 22–32)
Calcium: 10.1 mg/dL (ref 8.9–10.3)
Chloride: 103 mmol/L (ref 98–111)
Creatinine, Ser: 1.8 mg/dL — ABNORMAL HIGH (ref 0.61–1.24)
GFR calc Af Amer: 43 mL/min — ABNORMAL LOW (ref >60)
GFR calc non Af Amer: 37 mL/min — ABNORMAL LOW (ref >60)
Glucose, Bld: 139 mg/dL — ABNORMAL HIGH (ref 70–99)
Potassium: 4.3 mmol/L (ref 3.5–5.1)
Sodium: 138 mmol/L (ref 135–145)
Total Bilirubin: 4.8 mg/dL — ABNORMAL HIGH (ref 0.3–1.2)
Total Protein: 5.6 g/dL — ABNORMAL LOW (ref 6.5–8.1)

## 2019-12-02 LAB — TROPONIN I (HIGH SENSITIVITY)
Troponin I (High Sensitivity): 93 ng/L — ABNORMAL HIGH (ref <18)
Troponin I (High Sensitivity): 106 ng/L (ref <18)
Troponin I (High Sensitivity): 138 ng/L (ref <18)

## 2019-12-02 LAB — AMMONIA: Ammonia: 11 umol/L (ref 9–35)

## 2019-12-02 LAB — URINALYSIS, ROUTINE W REFLEX MICROSCOPIC
Bacteria, UA: NONE SEEN
Bilirubin Urine: NEGATIVE
Glucose, UA: NEGATIVE mg/dL
Ketones, ur: NEGATIVE mg/dL
Leukocytes,Ua: NEGATIVE
Nitrite: NEGATIVE
Protein, ur: NEGATIVE mg/dL
Specific Gravity, Urine: 1.021 (ref 1.005–1.030)
pH: 5 (ref 5.0–8.0)

## 2019-12-02 LAB — PROTIME-INR
INR: 1.8 — ABNORMAL HIGH (ref 0.8–1.2)
Prothrombin Time: 20.4 seconds — ABNORMAL HIGH (ref 11.4–15.2)

## 2019-12-02 LAB — LACTIC ACID, PLASMA
Lactic Acid, Venous: 3.3 mmol/L (ref 0.5–1.9)
Lactic Acid, Venous: 2.9 mmol/L (ref 0.5–1.9)

## 2019-12-02 LAB — TSH: TSH: 10.06 u[IU]/mL — ABNORMAL HIGH (ref 0.350–4.500)

## 2019-12-02 LAB — SARS CORONAVIRUS 2 BY RT PCR (HOSPITAL ORDER, PERFORMED IN ~~LOC~~ HOSPITAL LAB): SARS Coronavirus 2: NEGATIVE

## 2019-12-02 MED ORDER — LIDOCAINE 5 % EX PTCH
1.0000 | MEDICATED_PATCH | CUTANEOUS | Status: DC
  Administered 2019-12-02: 1 via TRANSDERMAL
  Filled 2019-12-02: qty 1

## 2019-12-02 MED ORDER — AZITHROMYCIN 250 MG PO TABS: 250.0000 mg | ORAL_TABLET | Freq: Every day | ORAL | Status: DC

## 2019-12-02 MED ORDER — AZITHROMYCIN 250 MG PO TABS: 500.0000 mg | ORAL_TABLET | Freq: Every day | ORAL | Status: DC

## 2019-12-02 MED ORDER — SODIUM CHLORIDE 0.9 % IV BOLUS
1000.0000 mL | Freq: Once | INTRAVENOUS | Status: AC
  Administered 2019-12-02: 1000 mL via INTRAVENOUS

## 2019-12-02 MED ORDER — PREGABALIN 100 MG PO CAPS: 100.0000 mg | ORAL_CAPSULE | Freq: Every day | ORAL | Status: DC

## 2019-12-02 MED ORDER — SODIUM CHLORIDE 0.9 % IV SOLN
500.0000 mg | Freq: Once | INTRAVENOUS | Status: AC
  Administered 2019-12-02: 500 mg via INTRAVENOUS
  Filled 2019-12-02: qty 500

## 2019-12-02 MED ORDER — SODIUM CHLORIDE 0.9 % IV BOLUS: 500.0000 mL | Freq: Once | INTRAVENOUS | Status: DC

## 2019-12-02 MED ORDER — DIAZEPAM 2 MG PO TABS
2.0000 mg | ORAL_TABLET | Freq: Every evening | ORAL | Status: DC | PRN
  Administered 2019-12-03: 2 mg via ORAL
  Filled 2019-12-02 (×2): qty 1

## 2019-12-02 MED ORDER — SODIUM CHLORIDE 0.9 % IV SOLN
1.0000 g | Freq: Once | INTRAVENOUS | Status: AC
  Administered 2019-12-02: 1 g via INTRAVENOUS
  Filled 2019-12-02: qty 10

## 2019-12-02 MED ORDER — SODIUM CHLORIDE 0.9 % IV SOLN
1.0000 g | INTRAVENOUS | Status: DC
  Administered 2019-12-03 – 2019-12-05 (×3): 1 g via INTRAVENOUS
  Filled 2019-12-02: qty 10
  Filled 2019-12-02: qty 1
  Filled 2019-12-02 (×2): qty 10
  Filled 2019-12-02: qty 0.05

## 2019-12-02 MED ORDER — PREGABALIN 50 MG PO CAPS
50.0000 mg | ORAL_CAPSULE | Freq: Every day | ORAL | Status: DC
  Administered 2019-12-02 – 2019-12-05 (×4): 50 mg via ORAL
  Filled 2019-12-02 (×4): qty 1

## 2019-12-02 MED ORDER — SODIUM CHLORIDE 0.9% FLUSH
3.0000 mL | Freq: Once | INTRAVENOUS | Status: AC
  Administered 2019-12-02: 3 mL via INTRAVENOUS

## 2019-12-02 MED ORDER — ASPIRIN EC 81 MG PO TBEC
81.0000 mg | DELAYED_RELEASE_TABLET | Freq: Every day | ORAL | Status: DC
  Administered 2019-12-02 – 2019-12-06 (×5): 81 mg via ORAL
  Filled 2019-12-02 (×5): qty 1

## 2019-12-02 MED ORDER — ENOXAPARIN SODIUM 30 MG/0.3ML ~~LOC~~ SOLN
30.0000 mg | SUBCUTANEOUS | Status: DC
  Administered 2019-12-02 – 2019-12-05 (×4): 30 mg via SUBCUTANEOUS
  Filled 2019-12-02 (×4): qty 0.3

## 2019-12-02 NOTE — ED Notes (Signed)
First attempt to call report unsuccessful. 
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
## 2019-12-02 NOTE — Consult Note (Signed)
WOC Nurse wound consult note Consultation was completed by review of records, images and assistance from the bedside nurse/clinical staff.    Reason for Consult: bilateral LE wounds Wound type: full thickness ulcerations right and left LE Pressure Injury POA:NA Measurement: see nursing flow sheet Wound bed:  1. Right dorsal foot; 100% eschar 2. L lateral leg: 100% clean; pink,slightly hyper granulated  3. Darkening/purple discoloration of left 2-5th toes Drainage (amount, consistency, odor) unable to assess; no dressings in images Periwound: intact  Dressing procedure/placement/frequency: 1. Xeroform; single layer to the left lateral leg wound, top with dry dressing, secure with conform or kerlix 2. Paint right dorsal foot wound with betadine; allow to air dry; cover with silicone foam. Ok to peel back foam to reapply betadine.  Discussed POC with patient and bedside nurse.  Re consult if needed, will not follow at this time. Thanks  Shlomie Romig [employer], RN,CWOCN, CNS, CWON-AP [phone])

## 2019-12-02 NOTE — ED Notes (Signed)
2nd attempt to call report unsuccessful.  

## 2019-12-02 NOTE — ED Notes (Signed)
Dinner tray delivered.
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
## 2019-12-03 ENCOUNTER — Encounter (HOSPITAL_BASED_OUTPATIENT_CLINIC_OR_DEPARTMENT_OTHER): Payer: Medicare HMO | Admitting: Internal Medicine

## 2019-12-03 ENCOUNTER — Other Ambulatory Visit: Payer: Self-pay

## 2019-12-03 ENCOUNTER — Encounter (HOSPITAL_COMMUNITY): Payer: Self-pay | Admitting: Family Medicine

## 2019-12-03 ENCOUNTER — Inpatient Hospital Stay (HOSPITAL_COMMUNITY): Payer: Medicare HMO

## 2019-12-03 DIAGNOSIS — E43 Unspecified severe protein-calorie malnutrition: Secondary | ICD-10-CM

## 2019-12-03 DIAGNOSIS — I361 Nonrheumatic tricuspid (valve) insufficiency: Secondary | ICD-10-CM

## 2019-12-03 DIAGNOSIS — I70234 Atherosclerosis of native arteries of right leg with ulceration of heel and midfoot: Secondary | ICD-10-CM

## 2019-12-03 DIAGNOSIS — L97909 Non-pressure chronic ulcer of unspecified part of unspecified lower leg with unspecified severity: Secondary | ICD-10-CM

## 2019-12-03 DIAGNOSIS — E8809 Other disorders of plasma-protein metabolism, not elsewhere classified: Secondary | ICD-10-CM | POA: Diagnosis present

## 2019-12-03 DIAGNOSIS — I351 Nonrheumatic aortic (valve) insufficiency: Secondary | ICD-10-CM

## 2019-12-03 DIAGNOSIS — R532 Functional quadriplegia: Secondary | ICD-10-CM

## 2019-12-03 DIAGNOSIS — K769 Liver disease, unspecified: Secondary | ICD-10-CM

## 2019-12-03 DIAGNOSIS — D696 Thrombocytopenia, unspecified: Secondary | ICD-10-CM

## 2019-12-03 DIAGNOSIS — R791 Abnormal coagulation profile: Secondary | ICD-10-CM

## 2019-12-03 DIAGNOSIS — R634 Abnormal weight loss: Secondary | ICD-10-CM | POA: Diagnosis present

## 2019-12-03 DIAGNOSIS — S2242XA Multiple fractures of ribs, left side, initial encounter for closed fracture: Secondary | ICD-10-CM

## 2019-12-03 DIAGNOSIS — N179 Acute kidney failure, unspecified: Secondary | ICD-10-CM

## 2019-12-03 DIAGNOSIS — S2249XA Multiple fractures of ribs, unspecified side, initial encounter for closed fracture: Secondary | ICD-10-CM | POA: Diagnosis present

## 2019-12-03 DIAGNOSIS — K8020 Calculus of gallbladder without cholecystitis without obstruction: Secondary | ICD-10-CM

## 2019-12-03 DIAGNOSIS — I739 Peripheral vascular disease, unspecified: Secondary | ICD-10-CM

## 2019-12-03 DIAGNOSIS — B179 Acute viral hepatitis, unspecified: Secondary | ICD-10-CM

## 2019-12-03 DIAGNOSIS — R627 Adult failure to thrive: Secondary | ICD-10-CM

## 2019-12-03 DIAGNOSIS — I70243 Atherosclerosis of native arteries of left leg with ulceration of ankle: Secondary | ICD-10-CM

## 2019-12-03 DIAGNOSIS — J690 Pneumonitis due to inhalation of food and vomit: Secondary | ICD-10-CM

## 2019-12-03 DIAGNOSIS — R7989 Other specified abnormal findings of blood chemistry: Secondary | ICD-10-CM

## 2019-12-03 DIAGNOSIS — I340 Nonrheumatic mitral (valve) insufficiency: Secondary | ICD-10-CM

## 2019-12-03 DIAGNOSIS — K08109 Complete loss of teeth, unspecified cause, unspecified class: Secondary | ICD-10-CM | POA: Diagnosis present

## 2019-12-03 DIAGNOSIS — J189 Pneumonia, unspecified organism: Secondary | ICD-10-CM

## 2019-12-03 DIAGNOSIS — R7401 Elevation of levels of liver transaminase levels: Secondary | ICD-10-CM | POA: Diagnosis present

## 2019-12-03 DIAGNOSIS — I714 Abdominal aortic aneurysm, without rupture, unspecified: Secondary | ICD-10-CM

## 2019-12-03 DIAGNOSIS — R4189 Other symptoms and signs involving cognitive functions and awareness: Secondary | ICD-10-CM

## 2019-12-03 DIAGNOSIS — D72829 Elevated white blood cell count, unspecified: Secondary | ICD-10-CM | POA: Diagnosis not present

## 2019-12-03 DIAGNOSIS — M6281 Muscle weakness (generalized): Secondary | ICD-10-CM

## 2019-12-03 DIAGNOSIS — Z79899 Other long term (current) drug therapy: Secondary | ICD-10-CM

## 2019-12-03 DIAGNOSIS — K7689 Other specified diseases of liver: Secondary | ICD-10-CM | POA: Diagnosis present

## 2019-12-03 HISTORY — DX: Abdominal aortic aneurysm, without rupture, unspecified: I71.40

## 2019-12-03 HISTORY — DX: Abdominal aortic aneurysm, without rupture: I71.4

## 2019-12-03 HISTORY — DX: Acute kidney failure, unspecified: N17.9

## 2019-12-03 HISTORY — DX: Thrombocytopenia, unspecified: D69.6

## 2019-12-03 HISTORY — DX: Functional quadriplegia: R53.2

## 2019-12-03 HISTORY — DX: Pneumonitis due to inhalation of food and vomit: J69.0

## 2019-12-03 HISTORY — DX: Unspecified severe protein-calorie malnutrition: E43

## 2019-12-03 HISTORY — DX: Abnormal weight loss: R63.4

## 2019-12-03 HISTORY — DX: Calculus of gallbladder without cholecystitis without obstruction: K80.20

## 2019-12-03 LAB — COMPREHENSIVE METABOLIC PANEL
ALT: 797 U/L — ABNORMAL HIGH (ref 0–44)
ALT: 630 U/L — ABNORMAL HIGH (ref 0–44)
AST: 1407 U/L — ABNORMAL HIGH (ref 15–41)
AST: 1105 U/L — ABNORMAL HIGH (ref 15–41)
Albumin: 2.8 g/dL — ABNORMAL LOW (ref 3.5–5.0)
Albumin: 2.8 g/dL — ABNORMAL LOW (ref 3.5–5.0)
Alkaline Phosphatase: 125 U/L (ref 38–126)
Alkaline Phosphatase: 119 U/L (ref 38–126)
Anion gap: 22 — ABNORMAL HIGH (ref 5–15)
Anion gap: 13 (ref 5–15)
BUN: 84 mg/dL — ABNORMAL HIGH (ref 8–23)
BUN: 86 mg/dL — ABNORMAL HIGH (ref 8–23)
CO2: 14 mmol/L — ABNORMAL LOW (ref 22–32)
CO2: 20 mmol/L — ABNORMAL LOW (ref 22–32)
Calcium: 10.1 mg/dL (ref 8.9–10.3)
Calcium: 9.9 mg/dL (ref 8.9–10.3)
Chloride: 107 mmol/L (ref 98–111)
Chloride: 108 mmol/L (ref 98–111)
Creatinine, Ser: 2 mg/dL — ABNORMAL HIGH (ref 0.61–1.24)
Creatinine, Ser: 2.13 mg/dL — ABNORMAL HIGH (ref 0.61–1.24)
GFR calc Af Amer: 38 mL/min — ABNORMAL LOW (ref >60)
GFR calc Af Amer: 35 mL/min — ABNORMAL LOW (ref >60)
GFR calc non Af Amer: 33 mL/min — ABNORMAL LOW (ref >60)
GFR calc non Af Amer: 30 mL/min — ABNORMAL LOW (ref >60)
Glucose, Bld: 94 mg/dL (ref 70–99)
Glucose, Bld: 95 mg/dL (ref 70–99)
Potassium: 4.7 mmol/L (ref 3.5–5.1)
Potassium: 4.4 mmol/L (ref 3.5–5.1)
Sodium: 143 mmol/L (ref 135–145)
Sodium: 141 mmol/L (ref 135–145)
Total Bilirubin: 5.3 mg/dL — ABNORMAL HIGH (ref 0.3–1.2)
Total Bilirubin: 4.2 mg/dL — ABNORMAL HIGH (ref 0.3–1.2)
Total Protein: 5.5 g/dL — ABNORMAL LOW (ref 6.5–8.1)
Total Protein: 5.1 g/dL — ABNORMAL LOW (ref 6.5–8.1)

## 2019-12-03 LAB — URINE CULTURE: Culture: NO GROWTH

## 2019-12-03 LAB — TROPONIN I (HIGH SENSITIVITY): Troponin I (High Sensitivity): 125 ng/L (ref <18)

## 2019-12-03 LAB — CBC
HCT: 42.2 % (ref 39.0–52.0)
Hemoglobin: 13.4 g/dL (ref 13.0–17.0)
MCH: 29.1 pg (ref 26.0–34.0)
MCHC: 31.8 g/dL (ref 30.0–36.0)
MCV: 91.5 fL (ref 80.0–100.0)
Platelets: 134 10*3/uL — ABNORMAL LOW (ref 150–400)
RBC: 4.61 MIL/uL (ref 4.22–5.81)
RDW: 19.7 % — ABNORMAL HIGH (ref 11.5–15.5)
WBC: 15.4 10*3/uL — ABNORMAL HIGH (ref 4.0–10.5)
nRBC: 0.3 % — ABNORMAL HIGH (ref 0.0–0.2)

## 2019-12-03 LAB — T4, FREE: Free T4: 1.01 ng/dL (ref 0.61–1.12)

## 2019-12-03 LAB — ECHOCARDIOGRAM COMPLETE: Weight: 1827.17 oz

## 2019-12-03 LAB — PHOSPHORUS: Phosphorus: 5.4 mg/dL — ABNORMAL HIGH (ref 2.5–4.6)

## 2019-12-03 LAB — PROTIME-INR
INR: 2.2 — ABNORMAL HIGH (ref 0.8–1.2)
INR: 1.7 — ABNORMAL HIGH (ref 0.8–1.2)
Prothrombin Time: 23.6 seconds — ABNORMAL HIGH (ref 11.4–15.2)
Prothrombin Time: 19.4 seconds — ABNORMAL HIGH (ref 11.4–15.2)

## 2019-12-03 LAB — PSA: Prostatic Specific Antigen: 2.37 ng/mL (ref 0.00–4.00)

## 2019-12-03 LAB — HEPATITIS PANEL, ACUTE
HCV Ab: NONREACTIVE
Hep A IgM: NONREACTIVE
Hep B C IgM: NONREACTIVE
Hepatitis B Surface Ag: NONREACTIVE

## 2019-12-03 LAB — HEPATITIS C ANTIBODY: HCV Ab: NONREACTIVE

## 2019-12-03 LAB — BILIRUBIN, DIRECT: Bilirubin, Direct: 2.2 mg/dL — ABNORMAL HIGH (ref 0.0–0.2)

## 2019-12-03 LAB — HIV ANTIBODY (ROUTINE TESTING W REFLEX): HIV Screen 4th Generation wRfx: NONREACTIVE

## 2019-12-03 LAB — PREALBUMIN: Prealbumin: <5 mg/dL — ABNORMAL LOW (ref 18–38)

## 2019-12-03 LAB — LACTIC ACID, PLASMA: Lactic Acid, Venous: 2.4 mmol/L (ref 0.5–1.9)

## 2019-12-03 LAB — MAGNESIUM: Magnesium: 2.5 mg/dL — ABNORMAL HIGH (ref 1.7–2.4)

## 2019-12-03 MED ORDER — ADULT MULTIVITAMIN W/MINERALS CH
1.0000 | ORAL_TABLET | Freq: Every day | ORAL | Status: DC
  Administered 2019-12-04 – 2019-12-06 (×3): 1 via ORAL
  Filled 2019-12-03 (×3): qty 1

## 2019-12-03 MED ORDER — DICLOFENAC SODIUM 1 % EX GEL
2.0000 g | Freq: Four times a day (QID) | CUTANEOUS | Status: DC
  Administered 2019-12-03 – 2019-12-06 (×12): 2 g via TOPICAL
  Filled 2019-12-03: qty 100

## 2019-12-03 MED ORDER — SODIUM CHLORIDE 0.9 % IV SOLN: INTRAVENOUS | Status: DC

## 2019-12-03 MED ORDER — ENSURE ENLIVE PO LIQD
237.0000 mL | Freq: Three times a day (TID) | ORAL | Status: DC
  Administered 2019-12-03 – 2019-12-05 (×7): 237 mL via ORAL

## 2019-12-03 NOTE — Progress Notes (Signed)
Urine sample sent to the lab

## 2019-12-03 NOTE — Evaluation (Signed)
Clinical/Bedside Swallow Evaluation Patient Details  Name: Matthew [name] MRN: [id number] Date of Birth: Oct 09, 1950  Today's Date: 12/03/2019 Time: SLP Start Time (ACUTE ONLY): 1410 SLP Stop Time (ACUTE ONLY): 1425 SLP Time Calculation (min) (ACUTE ONLY): 15 min  Past Medical History:  Past Medical History:  Diagnosis Date  . Anxiety disorder   . Carpal tunnel syndrome    Bilateral  . Carpal tunnel syndrome 02/06/2014  . Cervical spondylosis without myelopathy 05/07/2013  . Foot fracture    right foot  . Gait disorder 05/07/2013  . Hypertension   . Joint pain, knee   . PAD (peripheral artery disease) (HCC)   . Pain in joint, shoulder region   . Post traumatic stress disorder   . Restless leg syndrome   . Spinal stenosis in cervical region   . Spondylosis   . Stroke (HCC)   . Syrinx of spinal cord (HCC) 03/30/2018   Thoracic   Past Surgical History:  Past Surgical History:  Procedure Laterality Date  . ABDOMINAL AORTAGRAM  08/06/2019   ABDOMINAL AORTOGRAM W/LOWER EXTREMITY  . ABDOMINAL AORTOGRAM W/LOWER EXTREMITY N/A 08/06/2019   Procedure: ABDOMINAL AORTOGRAM W/LOWER EXTREMITY;  Surgeon: Maeola Harman, MD;  Location: [facility] INVASIVE CV LAB;  Service: Cardiovascular;  Laterality: N/A;  . ACDF     2009 or 2010  . FEMORAL ARTERY STENT Left    Superficial Femoral Artery stent; Dr B. Randie Heinz (Vasc)  . ILIAC ARTERY STENT Bilateral 08/06/2019   Dr B. Randie Heinz (Vasc)  . KNEE SURGERY  1980   HPI:  69 yo male admitted to ED on 6/27 with AMS, FTT, chronic LUE/LLE wounds. CT head, abdomen/pelvis reveal ground glass and patchy lung opacities in R>L lower lobe suspect for PNA vs aspiration pneumonitis, L 8-9 rib fractures, no intracranial abnormalities. 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   Assessment / Plan / Recommendation Clinical Impression  Mr.  Monterrosa was seen for a clinical swallow evaluation with PT noting coughing after PO intake. Pt denies difficulty, but is in for RLL consolidation/PNA versus aspiration pneumonitis (not known with emesis to this provider/per pt report). Pt with anorexia, refusing most PO intake, but was seen with thin liquid water via cup/straw and purees. Pt refused regular solid trial. He is edentulous, but reports he has no difficulty chewing without dentures (again, not seen d/t pt refusal). He had a delayed throat clear x1, but no overt s/s aspiration. Given his medical status, failure to thrive, and RLL consolidation, plan for modified barium swallow study to r/o aspiration as cause of PNA and assess swallowing physiology for safest diet recommendations. Continue current diet (regular solids and thin liquids) pending results of study. Of note, RN was not aware of any swallowing difficulty; taking meds without difficulty.    SLP Visit Diagnosis: Dysphagia, unspecified (R13.10)    Aspiration Risk  Mild aspiration risk    Diet Recommendation Regular;Thin liquid   Liquid Administration via: Straw;Cup Medication Administration: Whole meds with liquid Supervision: Staff to assist with self feeding;Patient able to self feed Postural Changes: Seated upright at 90 degrees    Other  Recommendations Oral Care Recommendations: Oral care BID   Follow up Recommendations   Pending results of MBSS     Frequency and Duration min 2x/week  1 week       Prognosis   Pending results of MBSS     Swallow Study  General Date of Onset: 12/03/19 HPI: 69 yo male admitted to ED on 6/27 with AMS, FTT, chronic LUE/LLE wounds. CT head, abdomen/pelvis reveal ground glass and patchy lung opacities in R>L lower lobe suspect for PNA vs aspiration pneumonitis, L 8-9 rib fractures, no intracranial abnormalities. PMH includes anxiety, cervical spondylosis without myelopathy and history of C-spine surgery, HTN, PTSD, CVA, thoracic  syrinx, former cocaine/heroine use 10 years ago. Previous Swallow Assessment: n/a Diet Prior to this Study: Regular;Thin liquids Temperature Spikes Noted: No Respiratory Status: Room air Behavior/Cognition: Alert;Cooperative Oral Cavity Assessment: Dry Oral Care Completed by SLP: No Oral Cavity - Dentition: Edentulous Vision: Functional for self-feeding Self-Feeding Abilities: Able to feed self Patient Positioning: Upright in bed Baseline Vocal Quality: Low vocal intensity Volitional Cough: Weak Volitional Swallow: Able to elicit    Oral/Motor/Sensory Function Overall Oral Motor/Sensory Function: Within functional limits   Thin Liquid Thin Liquid: Within functional limits Presentation: Straw;Cup    Puree Puree: Within functional limits Presentation: Self Fed   Solid     Solid: Not tested     Ilissa Rosner P. Jaron Czarnecki, M.S., CCC-SLP Speech-Language Pathologist Acute Rehabilitation Services Pager: [phone]  Jahlia Omura P Kennley Schwandt 12/03/2019,2:41 PM

## 2019-12-03 NOTE — Progress Notes (Signed)
  Echocardiogram 2D Echocardiogram has been performed.  Matthew [name] 12/03/2019, 5:01 PM

## 2019-12-03 NOTE — Evaluation (Signed)
Physical Therapy Evaluation Patient Details Name: Matthew [name] MRN: [id number] DOB: 1951/04/12 Today's Date: 12/03/2019   History of Present Illness  69 yo male admitted to ED on 6/27 with AMS, FTT, chronic LUE/LLE wounds. CT head, abdomen/pelvis reveal ground glass and patchy lung opacities in R>L lower lobe suspect for PNA vs aspiration pneumonitis, L 8-9 rib fractures, no intracranial abnormalities. 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  Clinical Impression   Pt presents with generalized weakness, LLE and abdominal pain, impaired standing balance with history of multiple falls, AMS, and decreased activity tolerance. Pt to benefit from acute PT to address deficits. Pt required mod assist for bed mobility and transfer to recliner at bedside with use of RW, pt tolerating limited pivotal steps to recliner only this day. At baseline, pt reports ambulating without AD short distance, but states falls and limited tolerance were starting to be limiting. PT recommending SNF level of care post-acutely, pt agreeable when PT discussed recommendations. PT to progress mobility as tolerated, and will continue to follow acutely.      Follow Up Recommendations SNF;Supervision/Assistance - 24 hour    Equipment Recommendations  Other (comment) (tbd)    Recommendations for Other Services       Precautions / Restrictions Precautions Precautions: Fall Restrictions Weight Bearing Restrictions: No      Mobility  Bed Mobility Overal bed mobility: Needs Assistance Bed Mobility: Rolling;Sidelying to Sit Rolling: Min assist Sidelying to sit: Mod assist;HOB elevated       General bed mobility comments: min assist for rolling to L for truncal translation, bringing LEs to EOB. Mod assist for sidelying to sit for trunk elevation via L shoulder girdle and R pelvic facilitation,  scooting to EOB. Increased time and effort.  Transfers Overall transfer level: Needs assistance Equipment used: Rolling walker (2 wheeled) Transfers: Sit to/from [employer] Sit to Stand: Mod assist;From elevated surface Stand pivot transfers: Mod assist;From elevated surface       General transfer comment: Mod assist for power up, hip extension to neutral, and steadying upon standing. Verbal cuing for correct hand placement not followed, pt placing bilateral UEs on RW and pulling to stand (stabilized by PT). mod assist for stand pivot to recliner for steadying, managing pt and RW, and slow eccentric lower into chair.  Ambulation/Gait             General Gait Details: small pivotal steps towards recliner only, pt limited by fatigue and unsteadiness  Stairs            Wheelchair Mobility    Modified Rankin (Stroke Patients Only)       Balance Overall balance assessment: Needs assistance;History of Falls Sitting-balance support: Feet supported;Single extremity supported Sitting balance-Leahy Scale: Fair Sitting balance - Comments: able to sit EOB without PT assist, requires assist to achieve seated balance.   Standing balance support: During functional activity;Bilateral upper extremity supported Standing balance-Leahy Scale: Poor Standing balance comment: reliant on external assist statically and dynamically                             Pertinent Vitals/Pain Pain Assessment: Faces Faces Pain Scale: Hurts little more Pain Location: abdomen, LLE Pain Descriptors / Indicators: Discomfort;Grimacing Pain Intervention(s): Limited activity within patient's tolerance;Monitored during session;Repositioned    Home Living Family/patient expects to be discharged to:: Private residence Living  Arrangements: Non-relatives/Friends (male friend) Available Help at Discharge: Family;Available 24 hours/day (states friend works from home) Type of Home:  House Home Access: Level entry;Other (comment) (pt states no steps, but then states "I have a split level")     Home Layout: One level Home Equipment: None Additional Comments: "I'm going to rent a walker"    Prior Function Level of Independence: Needs assistance   Gait / Transfers Assistance Needed: pt reports walking household distances but getting tired quickly  ADL's / Homemaking Assistance Needed: Pt reports having friend assist with dressing, states he is independent in dressing, cooking, cleaning  Comments: Pt reports ~20 falls in the last year, states many are in the bathroom     Hand Dominance   Dominant Hand: Right    Extremity/Trunk Assessment   Upper Extremity Assessment Upper Extremity Assessment: Defer to OT evaluation    Lower Extremity Assessment Lower Extremity Assessment: Generalized weakness;LLE deficits/detail LLE Deficits / Details: LLE guarding, but able to perform heel slide and full AROM knee extension. Pt holds LLE in hip abd/knee flexed position at rest, states he was in an accident recently. LLE: Unable to fully assess due to pain    Cervical / Trunk Assessment Cervical / Trunk Assessment: Normal;Other exceptions Cervical / Trunk Exceptions: cachetic appearance  Communication   Communication: No difficulties  Cognition Arousal/Alertness: Awake/alert Behavior During Therapy: Flat affect Overall Cognitive Status: No family/caregiver present to determine baseline cognitive functioning Area of Impairment: Following commands;Safety/judgement;Problem solving                       Following Commands: Follows one step commands with increased time Safety/Judgement: Decreased awareness of deficits;Decreased awareness of safety   Problem Solving: Decreased initiation;Difficulty sequencing;Requires verbal cues;Requires tactile cues General Comments: Pt with difficulty explaining home set up during subjective portion of exam, unsure of accuracy  of home set up and PLOF information. pt requires increased time to follow commands, and requires multimodal cuing for safe mobility.      General Comments      Exercises     Assessment/Plan    PT Assessment Patient needs continued PT services  PT Problem List Decreased strength;Decreased mobility;Decreased safety awareness;Decreased activity tolerance;Pain;Decreased balance;Decreased knowledge of use of DME;Decreased cognition       PT Treatment Interventions DME instruction;Therapeutic activities;Gait training;Therapeutic exercise;Patient/family education;Balance training;Functional mobility training;Neuromuscular re-education    PT Goals (Current goals can be found in the Care Plan section)  Acute Rehab PT Goals Patient Stated Goal: eat, get stronger PT Goal Formulation: With patient Time For Goal Achievement: 12/17/19 Potential to Achieve Goals: Good    Frequency Min 2X/week   Barriers to discharge        Co-evaluation               AM-PAC PT "6 Clicks" Mobility  Outcome Measure Help needed turning from your back to your side while in a flat bed without using bedrails?: A Little Help needed moving from lying on your back to sitting on the side of a flat bed without using bedrails?: A Lot Help needed moving to and from a bed to a chair (including a wheelchair)?: A Lot Help needed standing up from a chair using your arms (e.g., wheelchair or bedside chair)?: A Lot Help needed to walk in hospital room?: A Lot Help needed climbing 3-5 steps with a railing? : Total 6 Click Score: 12    End of Session Equipment Utilized During Treatment: Gait belt Activity  Tolerance: Patient limited by fatigue Patient left: in chair;with chair alarm set;with call bell/phone within reach Nurse Communication: Mobility status PT Visit Diagnosis: Other abnormalities of gait and mobility (R26.89);Muscle weakness (generalized) (M62.81);Difficulty in walking, not elsewhere classified  (R26.2)    Time: 4098-1191 PT Time Calculation (min) (ACUTE ONLY): 23 min   Charges:   PT Evaluation $PT Eval Low Complexity: 1 Low PT Treatments $Therapeutic Activity: 8-22 mins       Anne Sebring E, PT Acute Rehabilitation Services Pager [phone]  Office [phone]   Kemara Quigley D Despina Hidden 12/03/2019, 10:36 AM

## 2019-12-03 NOTE — Progress Notes (Signed)
Family Medicine Teaching Service Daily Progress Note Intern Pager: [phone]  Patient name: Matthew [name] Medical record number: [id number] Date of birth: 10-15-1950 Age: 69 y.o. Gender: male  Primary Care Provider: Zenia Resides, MD Consultants: Nutrition Code Status: Full code  Pt Overview and Major Events to Date:  Admitted 6/27  Assessment and Plan:  Matthew [name] is a 69 y.o. male presenting with altered mental status and failure to thrive. PMH is significant for chronic pain, PAD, anxiety disorder, cervical spondylosis without myelopathy, history of stroke.  Acute on chronic liver failure On admission AST/ALT were elevated at 105/164 with a T bili of 4.8.  Patient reports remote history of IV drug use.  CT abdomen showed decompressed gallbladder with gallstones but no evidence of cholecystitis.  Mild right upper quadrant tenderness this morning on exam.  Lab work this morning showed significant increase in LFTs to AST/ALT of 1407/797.  T bili was mildly increased from yesterday at 5.3 this morning.  Patient's pain has not increased in the right upper quadrant.  Unsure of etiology of this acute spike in LFTs.  With the rapid rise I would consider drugs being the cause but patient was only given Rocephin and azithromycin.  There are case reports of azithromycin causing liver failure but that tends to be a longer duration.  Other things on the differential include viral hepatitis, ischemic bowel, shock liver.  Patient appears comfortable and is hungry and actually looks improved physically from yesterday's evaluation.  The exam makes the ischemic bowel and shock liver less likely. -Follow-up on hepatitis C antibody -Hepatitis C RNA ordered -Acute hepatitis panel ordered -Follow-up unfractionated bilirubin -Right upper quadrant ultrasound -Repeat CMP -Continue morning CMP's -Repeat PT/INR -Consider GI consult  Failure to thrive with recurrent falls Patient extremely cachectic  on exam.  Per chart review the patient has lost approximately 50 pounds over the last year and a half.  It appears that his appetite has decreased since he has been injured but the weight loss has been going on for duration significantly longer than that.  Patient reports that he is hungry this morning and would like to eat breakfast. -Up with assistance -Fall precautions -Continuous pulse ox -Continuous cardiac monitoring -Monitor refeeding labs -Morning CBC and CMP -Morning magnesium -Morning Phos -Daily weights -Follow-up on nutrition consult -PT/OT eval and treat  Multifocal pneumonia On admission patient denied any shortness of breath or cough or congestion.  He had his chest CT which showed findings acute for multifocal pneumonia.  WBCs are 8.  Patient was given Rocephin and azithromycin to treat for CAP. -Continue ceftriaxone 1 g every 24 hours -Discontinue azithromycin given transaminitis -Continuous pulse ox -Continuous cardiac monitoring -Monitor respiratory status -Encourage incentive spirometry  AKI Baseline creatinine is around 1.5.  Creatinine on admission was elevated at 1.8.  It trended up to above 2 this morning. -Encourage p.o. intake -Morning BMPs -Avoid nephrotoxic agents -Maintenance IV fluids with normal saline  Anxiety Home medications include Valium  -Continue home medication however to avoid withdrawal  Chronic pain Patient with history of chronic cervical pain which she reports having procedure done. Home medications include Lyrica 100 mg at bedtime. -Lyrica 50 mg at bedtime ordered  Hx of CVA Sometime in the 2000s. No currently medications  Hx of knee surgery Patient unsure of when this occurred but reports it was "years ago".  Hx of smoking Quit 17 years ago. 1 ppd   Hx of recreational drug use Not for the past  10 years. Pervious use of cocaine, heroin, MJ. -Monitor for signs and symptoms of withdrawal -Consider UDS  FEN/GI: Regular  diet PPx: Lovenox  Disposition: Social work evaluation and determining cause of transaminitis  Subjective:  Patient reports he is feeling better this morning.  Denies any abdominal pain but does note mild tenderness to palpation of the right upper quadrant.  Reports that he is hungry and would like something to eat.  Objective: Temp:  [97.2 F (36.2 C)-97.6 F (36.4 C)] 97.6 F (36.4 C) (06/27 1910) Pulse Rate:  [29-104] 86 (06/27 2103) Resp:  [16-38] 26 (06/27 2103) BP: (109-147)/(68-120) 115/70 (06/27 2103) SpO2:  [52 %-100 %] 93 % (06/27 2247) Physical Exam: General: Cachectic, ill-appearing Cardiovascular: Regular rate.  No murmurs noted Respiratory: Normal work of breathing, crackles in left lower lung base Abdomen: Flat, mild tenderness to palpation right upper quadrant no masses appreciated Extremities: Cachexia, edema noted of feet bilaterally with wounds present on admission  Laboratory: Recent Labs  Lab 12/02/19 1055 12/02/19 2102 12/03/19 0026  WBC 9.5 8.8 15.4*  HGB 13.9 14.9 13.4  HCT 45.4 48.5 42.2  PLT 127* 131* 134*   Recent Labs  Lab 12/02/19 1055 12/02/19 2102 12/03/19 0026  NA 138  --  143  K 4.3  --  4.7  CL 103  --  107  CO2 19*  --  14*  BUN 79*  --  84*  CREATININE 1.80* 2.17* 2.00*  CALCIUM 10.1  --  10.1  PROT 5.6*  --  5.5*  BILITOT 4.8*  --  5.3*  ALKPHOS 118  --  125  ALT 164*  --  797*  AST 105*  --  1,407*  GLUCOSE 139*  --  94    US Abdomen Limited RUQ  Result Date: 12/03/2019 CLINICAL DATA:  Acute hepatitis. EXAM: ULTRASOUND ABDOMEN LIMITED RIGHT UPPER QUADRANT COMPARISON:  CT scan dated 12/02/2019 FINDINGS: Gallbladder: Numerous gallstones. Slight thickening of the gallbladder wall to a diameter of 3.4 mm. Negative sonographic Murphy's sign. Small amount of pericholecystic fluid. Common bile duct: Diameter: 4.3 mm, normal. Liver: Two small simple cysts in the left lobe, 16 mm and 7 mm. Within normal limits in parenchymal  echogenicity. Portal vein is patent on color Doppler imaging with normal direction of blood flow towards the liver. Other: Ascites. Right pleural effusion. Distended hepatic veins suggesting elevated right heart pressure. IMPRESSION: 1. Cholelithiasis. 2. Ascites. 3. Distended hepatic veins consistent with elevated right heart pressure. 4. Right pleural effusion. Electronically Signed   By: Francene Boyers M.D.   On: 12/03/2019 09:40   Derrel Nip, MD 12/03/2019, 5:35 AM PGY-1, [facility] Family Medicine FPTS Intern pager: [phone], text pages welcome

## 2019-12-03 NOTE — Progress Notes (Signed)
Occupational Therapy Evaluation Patient Details Name: Matthew [name] MRN: [id number] DOB: [birth year] Today's Date: 12/03/2019    History of Present Illness 69 yo male admitted to ED on 6/27 with AMS, FTT, chronic LUE/LLE wounds. CT head, abdomen/pelvis reveal ground glass and patchy lung opacities in R>L lower lobe suspect for PNA vs aspiration pneumonitis, L 8-9 rib fractures, no intracranial abnormalities. 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   Clinical Impression   Pt presents with deficits in endurance, overall strength, standing balance and hx of falls. Pt reports increasing difficulty completing daily tasks at home, but typically able to ambulate without AD. Pt now requires overall Mod A for bed mobility, Mod A for sit to stand transfers with RW, and only able to take 1-2 side steps at bedside today due to deficits. Pt requires Mod A for UB ADLs sitting EOB and Max - Total A for LB ADLs in standing with RW. Based on current presentation, pt would benefit from short term SNF rehab prior to discharge home in order to maximize safety and independence. Will continue to follow acutely.     Follow Up Recommendations  SNF;Supervision/Assistance - 24 hour    Equipment Recommendations  3 in 1 bedside commode;Other (comment) (RW)    Recommendations for Other Services       Precautions / Restrictions Precautions Precautions: Fall Restrictions Weight Bearing Restrictions: No      Mobility Bed Mobility Overal bed mobility: Needs Assistance Bed Mobility: Supine to Sit;Sit to Supine     Supine to sit: Mod assist Sit to supine: Mod assist   General bed mobility comments: Mod A to advance B LE, increased grimacing with R knee extension  Transfers Overall transfer level: Needs assistance Equipment used: Rolling walker (2 wheeled) Transfers: Sit to/from  Stand Sit to Stand: Mod assist;From elevated surface         General transfer comment: Mod A for power up from bedside with RW, cues and increased time needed for proper hand placement. Pt able to take 2 side steps to [facility] before fatiguing and needing to sit    Balance Overall balance assessment: Needs assistance;History of Falls Sitting-balance support: Feet supported;Single extremity supported Sitting balance-Leahy Scale: Fair     Standing balance support: During functional activity;Bilateral upper extremity supported Standing balance-Leahy Scale: Poor Standing balance comment: reliant on external assist statically and dynamically                           ADL either performed or assessed with clinical judgement   ADL Overall ADL's : Needs assistance/impaired Eating/Feeding: Set up;Sitting   Grooming: Set up;Sitting   Upper Body Bathing: Moderate assistance;Sitting Upper Body Bathing Details (indicate cue type and reason): Mod A for UB bathing to wash back Lower Body Bathing: Maximal assistance;Sit to/from stand Lower Body Bathing Details (indicate cue type and reason): Max A for peri care in standing Upper Body Dressing : Moderate assistance Upper Body Dressing Details (indicate cue type and reason): Mod A to don hospital gown sitting EOB Lower Body Dressing: Sit to/from stand;Total assistance   Toilet Transfer: Moderate assistance;Stand-pivot;BSC   Toileting- Clothing Manipulation and Hygiene: Total assistance;Sit to/from stand Toileting - Clothing Manipulation Details (indicate cue type and reason): Total A for cleanup after some bowel incontinence       General ADL Comments: Pt with severely impaired strength and endurance,  skeletal frame, impacting ability to complete ADLs without extensive assist     Vision Baseline Vision/History: Wears glasses Patient Visual Report: No change from baseline Vision Assessment?: No apparent visual deficits      Perception     Praxis      Pertinent Vitals/Pain Pain Assessment: Faces Faces Pain Scale: Hurts even more Pain Location: bottom Pain Descriptors / Indicators: Discomfort;Grimacing Pain Intervention(s): Limited activity within patient's tolerance;Monitored during session     Hand Dominance Right   Extremity/Trunk Assessment Upper Extremity Assessment Upper Extremity Assessment: Generalized weakness   Lower Extremity Assessment Lower Extremity Assessment: Defer to PT evaluation   Cervical / Trunk Assessment Cervical / Trunk Assessment: Normal Cervical / Trunk Exceptions: cachetic appearance   Communication Communication Communication: No difficulties   Cognition Arousal/Alertness: Awake/alert Behavior During Therapy: Flat affect Overall Cognitive Status: No family/caregiver present to determine baseline cognitive functioning Area of Impairment: Following commands;Safety/judgement;Problem solving                       Following Commands: Follows one step commands with increased time;Follows one step commands inconsistently Safety/Judgement: Decreased awareness of deficits;Decreased awareness of safety   Problem Solving: Decreased initiation;Difficulty sequencing;Requires verbal cues;Requires tactile cues General Comments: Pt initially very lethargic on first OT eval attempt, information from first eval attempt and second with varying answers for PLOF   General Comments  SpO2 briefly high 80s on RA during activity, quickly increasing to 95-100% after seated rest break. Educated on pursed  lip breathing. HR up to 101bpm during activity     Exercises     Shoulder Instructions      Home Living Family/patient expects to be discharged to:: Private residence Living Arrangements: Non-relatives/Friends (male friend) Available Help at Discharge: Friend(s);Available 24 hours/day (friend works from home) Type of Home: House Home Access: Level entry;Other (comment)  (no steps vs split level)     Home Layout: One level     Bathroom Shower/Tub: Tub/shower unit;Walk-in Human resources officer: Standard     Home Equipment: None      Lives With: Friend(s)    Prior Functioning/Environment Level of Independence: Needs assistance  Gait / Transfers Assistance Needed: pt reports walking household distances without AD but getting tired quickly ADL's / Homemaking Assistance Needed: Reports having increasing difficulty with ADLs, but typically able to complete             OT Problem List: Decreased strength;Decreased activity tolerance;Impaired balance (sitting and/or standing);Decreased safety awareness;Decreased knowledge of use of DME or AE      OT Treatment/Interventions: Self-care/ADL training;Therapeutic exercise;Energy conservation;DME and/or AE instruction;Therapeutic activities;Patient/family education    OT Goals(Current goals can be found in the care plan section) Acute Rehab OT Goals Patient Stated Goal: eat, get stronger OT Goal Formulation: With patient Time For Goal Achievement: 12/17/19 Potential to Achieve Goals: Fair ADL Goals Pt Will Perform Upper Body Bathing: with min guard assist;sitting Pt Will Perform Lower Body Bathing: with min assist;sitting/lateral leans;sit to/from stand Pt Will Perform Lower Body Dressing: with min assist;sit to/from stand Pt Will Transfer to Toilet: with min assist;stand pivot transfer;bedside commode Pt Will Perform Toileting - Clothing Manipulation and hygiene: with min assist;sit to/from stand;sitting/lateral leans Additional ADL Goal #1: Pt to verbalize at least 3 energy conservation strategies to implement during ADLs to maximize independence.  OT Frequency: Min 2X/week   Barriers to D/C:            Co-evaluation  AM-PAC OT "6 Clicks" Daily Activity     Outcome Measure Help from another person eating meals?: A Little Help from another person taking care of personal  grooming?: A Little Help from another person toileting, which includes using toliet, bedpan, or urinal?: Total Help from another person bathing (including washing, rinsing, drying)?: A Lot Help from another person to put on and taking off regular upper body clothing?: A Little Help from another person to put on and taking off regular lower body clothing?: Total 6 Click Score: 13   End of Session Equipment Utilized During Treatment: Gait belt;Rolling walker Nurse Communication: Mobility status  Activity Tolerance: Patient limited by fatigue Patient left: in bed;with call bell/phone within reach;with bed alarm set  OT Visit Diagnosis: Unsteadiness on feet (R26.81);Other abnormalities of gait and mobility (R26.89);Muscle weakness (generalized) (M62.81);History of falling (Z91.81)                Time: 5009-3818 OT Time Calculation (min): 30 min Charges:  OT General Charges $OT Visit: 1 Visit OT Evaluation $OT Eval Moderate Complexity: 1 Mod OT Treatments $Therapeutic Activity: 8-22 mins  Lorre Munroe, OTR/L  Lorre Munroe 12/03/2019, 3:45 PM

## 2019-12-03 NOTE — Progress Notes (Signed)
OT Cancellation Note  Patient Details Name: Matthew [name] MRN: [id number] DOB: 26-Sep-1950   Cancelled Treatment:    Reason Eval/Treat Not Completed: Fatigue/lethargy limiting ability to participate. Attempted OT eval, but pt lethargic falling asleep when answering questions. Pt declined to attempt to get OOB at this time. Will re-attempt as time allows.  Lorre Munroe 12/03/2019, 1:45 PM

## 2019-12-03 NOTE — Progress Notes (Addendum)
Went to evaluate patient after noting his LFTs jumped dramatically overnight.  Patient's last AST increased from [phone], last ALT increased from 164-797 over last 24 hours.  Patient also noted to have an increase in PT from 20.4-23.6 and an increase in INR from 1.8-2.2 since labs were drawn on the morning of the 27th. Total bili also increased from  4.8>5.5.  Found patient as a cachectic appearing older male in no acute distress.  Patient was alert and oriented to person, place, year, and situation and had no complaints of pain.  Patient with only some mild abdominal discomfort on palpation that does not seem to be localized to the region of his liver.  Does have some edema of his feet bilaterally which appears similar to photos in the H&P. Called pharmacy discussing potential of medication induced liver injury/failure.  Pharmacy stated that the azithromycin did have multiple case reports she was able to find of it causing liver injury but that nothing else in his current medications seemed a likely culprit.  Plan: -We will hold azithromycin -Repeat CMP, PT/INR -Acute hepatitis panel -Fractionated Bili
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
## 2019-12-03 NOTE — Consult Note (Signed)
Referring Physician: Family practice teaching service  Patient name: Matthew [name] MRN: [id number] DOB: [birth year] Sex: male  REASON FOR CONSULT: Bilateral leg ulcers  HPI: Matthew [name] is a 69 y.o. male, previously known to our group.  He was admitted to the hospital over the weekend for failure to thrive.  He was noted on exam to have an ulcer on his right foot and left pretibial area.  Previously had stenting of his left superficial femoral artery and bilateral common iliac artery by Dr. Donzetta Matters March 2021.  He was seen by Dr. Donzetta Matters last 14th 2021.  He was noted to still have an ischemic appearing foot on exam at that time.  His ABIs on March 1 were 1.2 and triphasic on the right 1.24 and biphasic on the left toe pressure was 0 on the left 71 on the right.  He was having shortness of breath at that point so his arteriogram was delayed.  He states that he has been very weak and unable to walk for several weeks.  Still complains of pain in his left and right foot.  Left foot is worse.  Ongoing medical issues while he has been here, and include potential liver failure, multifocal pneumonia currently on antibiotics, acute kidney injury currently recovering.    Past Medical History:  Diagnosis Date  . Anxiety disorder   . Carpal tunnel syndrome    Bilateral  . Carpal tunnel syndrome 02/06/2014  . Cervical spondylosis without myelopathy 05/07/2013  . Cholelithiasis 12/03/2019  . Complete immobility due to severe physical disability or frailty ([place]) 12/03/2019  . Foot fracture    right foot  . Gait disorder 05/07/2013  . Hypertension   . Joint pain, knee   . PAD (peripheral artery disease) ([place])   . Pain in joint, shoulder region   . Post traumatic stress disorder   . Restless leg syndrome   . Spinal stenosis in cervical region   . Spondylosis   . Stroke ([place])   . Syrinx of spinal cord ([place]) 03/30/2018   Thoracic   Past Surgical History:  Procedure Laterality Date  . ABDOMINAL  AORTAGRAM  08/06/2019   ABDOMINAL AORTOGRAM W/LOWER EXTREMITY  . ABDOMINAL AORTOGRAM W/LOWER EXTREMITY N/A 08/06/2019   Procedure: ABDOMINAL AORTOGRAM W/LOWER EXTREMITY;  Surgeon: Waynetta Sandy, MD;  Location: [facility];  Service: Cardiovascular;  Laterality: N/A;  . ACDF     2009 or 2010  . FEMORAL ARTERY STENT Left    Superficial Femoral Artery stent; Dr B. Donzetta Matters (Vasc)  . ILIAC ARTERY STENT Bilateral 08/06/2019   Dr B. Donzetta Matters (Vasc)  . KNEE SURGERY  1980    Family History  Problem Relation Age of Onset  . Cancer Brother 60       brain tumor  . Cancer - Lung Mother   . Diabetes Father   . Hypertension Other   . Stroke Other   . Diabetes Other     SOCIAL HISTORY: Social History   Socioeconomic History  . Marital status: Divorced    Spouse name: Not on file  . Number of children: 3  . Years of education: college  . Highest education level: Not on file  Occupational History    Employer: UNEMPLOYED    Comment: retired  . Occupation: retired  Tobacco Use  . Smoking status: Former Research scientist (life sciences)  . Smokeless tobacco: Never Used  . Tobacco comment: quit 8 years  ago  Vaping Use  . Vaping Use: Never  used  Substance and Sexual Activity  . Alcohol use: No    Alcohol/week: 0.0 standard drinks    Comment: quit drinking 8 years ago  . Drug use: No  . Sexual activity: Not on file  Other Topics Concern  . Not on file  Social History Narrative   Patient is right handed, consumes one cup of caffeine daily, resides in home with roommate.   Social Determinants of Health   Financial Resource Strain:   . Difficulty of Paying Living Expenses:   Food Insecurity:   . Worried About Programme researcher, broadcasting/film/video in the Last Year:   . Barista in the Last Year:   Transportation Needs:   . Freight forwarder (Medical):   Marland Kitchen Lack of Transportation (Non-Medical):   Physical Activity:   . Days of Exercise per Week:   . Minutes of Exercise per Session:   Stress:   .  Feeling of Stress :   Social Connections:   . Frequency of Communication with Friends and Family:   . Frequency of Social Gatherings with Friends and Family:   . Attends Religious Services:   . Active Member of Clubs or Organizations:   . Attends Banker Meetings:   Marland Kitchen Marital Status:   Intimate Partner Violence:   . Fear of Current or Ex-Partner:   . Emotionally Abused:   Marland Kitchen Physically Abused:   . Sexually Abused:     Allergies  Allergen Reactions  . Contrast Media [Iodinated Diagnostic Agents] Hives, Shortness Of Breath and Nausea And Vomiting  . Penicillins Itching    Did it involve swelling of the face/tongue/throat, SOB, or low BP? No Did it involve sudden or severe rash/hives, skin peeling, or any reaction on the inside of your mouth or nose? No Did you need to seek medical attention at a hospital or doctor's office? No When did it last happen?30 years If all above answers are "NO", may proceed with cephalosporin use.  . Codeine Itching    Current Facility-Administered Medications  Medication Dose Route Frequency Provider Last Rate Last Admin  . 0.9 %  sodium chloride infusion   Intravenous Continuous Derrel Nip, MD 90 mL/hr at 12/03/19 0839 New Bag at 12/03/19 0839  . aspirin EC tablet 81 mg  81 mg Oral Daily Derrel Nip, MD   81 mg at 12/03/19 0839  . cefTRIAXone (ROCEPHIN) 1 g in sodium chloride 0.9 % 100 mL IVPB  1 g Intravenous Q24H Derrel Nip, MD 200 mL/hr at 12/03/19 1554 1 g at 12/03/19 1554  . diazepam (VALIUM) tablet 2 mg  2 mg Oral QHS PRN Derrel Nip, MD      . enoxaparin (LOVENOX) injection 30 mg  30 mg Subcutaneous Q24H Derrel Nip, MD   30 mg at 12/03/19 1554  . feeding supplement (ENSURE ENLIVE) (ENSURE ENLIVE) liquid 237 mL  237 mL Oral TID BM McDiarmid, Leighton Roach, MD   237 mL at 12/03/19 1552  . [START ON 12/04/2019] multivitamin with minerals tablet 1 tablet  1 tablet Oral Daily McDiarmid, Leighton Roach, MD      .  pregabalin (LYRICA) capsule 50 mg  50 mg Oral QHS Mirian Mo, MD   50 mg at 12/02/19 2248    ROS:   General:  + weight loss, no Fever, chills  HEENT: No recent headaches, no nasal bleeding, no visual changes, no sore throat  Neurologic: No dizziness, blackouts, seizures. No recent symptoms of stroke or mini- stroke. No recent  episodes of slurred speech, or temporary blindness.  Cardiac: No recent episodes of chest pain/pressure, + shortness of breath at rest.  + shortness of breath with exertion.  Denies history of atrial fibrillation or irregular heartbeat  Vascular: + history of rest pain in feet.  No history of claudication.  No history of non-healing ulcer, No history of DVT   Pulmonary: No home oxygen, no productive cough, no hemoptysis,  No asthma or wheezing  Musculoskeletal:  [ ]  Arthritis, [ ]  Low back pain,  [ ]  Joint pain  Hematologic:No history of hypercoagulable state.  No history of easy bleeding.  No history of anemia  Gastrointestinal: No hematochezia or melena,  No gastroesophageal reflux, no trouble swallowing  Urinary: [X]  chronic Kidney disease, [ ]  on HD - [ ]  MWF or [ ]  TTHS, [ ]  Burning with urination, [ ]  Frequent urination, [ ]  Difficulty urinating;   Skin: No rashes  Psychological: + history of anxiety,  No history of depression   Physical Examination  Vitals:   12/03/19 0500 12/03/19 0814 12/03/19 1457 12/03/19 1626  BP:  95/62 94/70 91/69   Pulse:  88 (!) 103 94  Resp:    (!) 22  Temp:  98.2 F (36.8 C) 98.8 F (37.1 C) 98.6 F (37 C)  TempSrc:    Oral  SpO2:  100% 98% 95%  Weight: 51.8 kg       Body mass index is 15.49 kg/m.  General:  Alert and oriented, no acute distress, cachectic and frail appearing HEENT: Normal Neck: No JVD Pulmonary: Clear to auscultation bilaterally Cardiac: Regular Rate and Rhythm  Abdomen: Soft, non-tender Skin: No rash, left foot edematous with ruborous appearance, 3 x 2 cm pretibial ulcer patient has a  brace left leg, 2 x 2 centimeter less than 1 mm depth ulceration middle portion dorsum right foot Extremity Pulses:  2+ radial, brachial, femoral, popliteal absent dorsalis pedis, posterior tibial pulses bilaterally Musculoskeletal: No deformity Neurologic: Upper and lower extremity motor 5/5 and symmetric  DATA:  CBC    Component Value Date/Time   WBC 15.4 (H) 12/03/2019 0026   RBC 4.61 12/03/2019 0026   HGB 13.4 12/03/2019 0026   HCT 42.2 12/03/2019 0026   PLT 134 (L) 12/03/2019 0026   MCV 91.5 12/03/2019 0026   MCH 29.1 12/03/2019 0026   MCHC 31.8 12/03/2019 0026   RDW 19.7 (H) 12/03/2019 0026   LYMPHSABS 3.4 08/21/2019 1705   MONOABS 0.8 08/21/2019 1705   EOSABS 0.4 08/21/2019 1705   BASOSABS 0.1 08/21/2019 1705    BMET    Component Value Date/Time   NA 141 12/03/2019 0906   K 4.4 12/03/2019 0906   CL 108 12/03/2019 0906   CO2 20 (L) 12/03/2019 0906   GLUCOSE 95 12/03/2019 0906   BUN 86 (H) 12/03/2019 0906   CREATININE 2.13 (H) 12/03/2019 0906   CALCIUM 9.9 12/03/2019 0906   GFRNONAA 30 (L) 12/03/2019 0906   GFRAA 35 (L) 12/03/2019 0906     ASSESSMENT: Patient with known peripheral arterial disease currently with other ongoing medical issues with elevated LFTs of unknown etiology rising serum creatinine and renal failure wounds on both feet.   PLAN: I will make Dr. 08/23/2019 aware of the patient's admission.  He will need to have significant improvement of his overall medical problems including his liver function tests INR and renal function prior to proceeding with any vascular intervention.  We will follow as a consult.   12/05/2019, MD [facility] Office: [phone] Pager: [phone]

## 2019-12-04 ENCOUNTER — Inpatient Hospital Stay (HOSPITAL_COMMUNITY): Payer: Medicare HMO

## 2019-12-04 ENCOUNTER — Encounter (HOSPITAL_COMMUNITY): Payer: Self-pay | Admitting: Family Medicine

## 2019-12-04 DIAGNOSIS — J690 Pneumonitis due to inhalation of food and vomit: Principal | ICD-10-CM

## 2019-12-04 DIAGNOSIS — E43 Unspecified severe protein-calorie malnutrition: Secondary | ICD-10-CM

## 2019-12-04 DIAGNOSIS — I340 Nonrheumatic mitral (valve) insufficiency: Secondary | ICD-10-CM | POA: Diagnosis present

## 2019-12-04 DIAGNOSIS — I5021 Acute systolic (congestive) heart failure: Secondary | ICD-10-CM

## 2019-12-04 DIAGNOSIS — Z01810 Encounter for preprocedural cardiovascular examination: Secondary | ICD-10-CM

## 2019-12-04 DIAGNOSIS — J189 Pneumonia, unspecified organism: Secondary | ICD-10-CM

## 2019-12-04 DIAGNOSIS — I429 Cardiomyopathy, unspecified: Secondary | ICD-10-CM

## 2019-12-04 DIAGNOSIS — I50810 Right heart failure, unspecified: Secondary | ICD-10-CM

## 2019-12-04 DIAGNOSIS — A419 Sepsis, unspecified organism: Secondary | ICD-10-CM | POA: Diagnosis present

## 2019-12-04 DIAGNOSIS — F39 Unspecified mood [affective] disorder: Secondary | ICD-10-CM

## 2019-12-04 DIAGNOSIS — I447 Left bundle-branch block, unspecified: Secondary | ICD-10-CM

## 2019-12-04 DIAGNOSIS — R1312 Dysphagia, oropharyngeal phase: Secondary | ICD-10-CM | POA: Diagnosis present

## 2019-12-04 DIAGNOSIS — R634 Abnormal weight loss: Secondary | ICD-10-CM

## 2019-12-04 DIAGNOSIS — D696 Thrombocytopenia, unspecified: Secondary | ICD-10-CM

## 2019-12-04 HISTORY — DX: Left bundle-branch block, unspecified: I44.7

## 2019-12-04 HISTORY — DX: Acute systolic (congestive) heart failure: I50.21

## 2019-12-04 HISTORY — DX: Dysphagia, oropharyngeal phase: R13.12

## 2019-12-04 LAB — CBC
HCT: 41.2 % (ref 39.0–52.0)
Hemoglobin: 13.2 g/dL (ref 13.0–17.0)
MCH: 28.8 pg (ref 26.0–34.0)
MCHC: 32 g/dL (ref 30.0–36.0)
MCV: 89.8 fL (ref 80.0–100.0)
Platelets: 121 10*3/uL — ABNORMAL LOW (ref 150–400)
RBC: 4.59 MIL/uL (ref 4.22–5.81)
RDW: 20.6 % — ABNORMAL HIGH (ref 11.5–15.5)
WBC: 12 10*3/uL — ABNORMAL HIGH (ref 4.0–10.5)
nRBC: 0.2 % (ref 0.0–0.2)

## 2019-12-04 LAB — RPR: RPR Ser Ql: NONREACTIVE

## 2019-12-04 LAB — COMPREHENSIVE METABOLIC PANEL
ALT: 431 U/L — ABNORMAL HIGH (ref 0–44)
AST: 363 U/L — ABNORMAL HIGH (ref 15–41)
Albumin: 2.7 g/dL — ABNORMAL LOW (ref 3.5–5.0)
Alkaline Phosphatase: 117 U/L (ref 38–126)
Anion gap: 15 (ref 5–15)
BUN: 79 mg/dL — ABNORMAL HIGH (ref 8–23)
CO2: 16 mmol/L — ABNORMAL LOW (ref 22–32)
Calcium: 9.8 mg/dL (ref 8.9–10.3)
Chloride: 109 mmol/L (ref 98–111)
Creatinine, Ser: 2 mg/dL — ABNORMAL HIGH (ref 0.61–1.24)
GFR calc Af Amer: 38 mL/min — ABNORMAL LOW (ref >60)
GFR calc non Af Amer: 33 mL/min — ABNORMAL LOW (ref >60)
Glucose, Bld: 116 mg/dL — ABNORMAL HIGH (ref 70–99)
Potassium: 4 mmol/L (ref 3.5–5.1)
Sodium: 140 mmol/L (ref 135–145)
Total Bilirubin: 3.3 mg/dL — ABNORMAL HIGH (ref 0.3–1.2)
Total Protein: 5.2 g/dL — ABNORMAL LOW (ref 6.5–8.1)

## 2019-12-04 LAB — HCV RNA QUANT: HCV Quantitative: NOT DETECTED IU/mL (ref >50)

## 2019-12-04 LAB — PHOSPHORUS: Phosphorus: 3.2 mg/dL (ref 2.5–4.6)

## 2019-12-04 LAB — MAGNESIUM: Magnesium: 2.4 mg/dL (ref 1.7–2.4)

## 2019-12-04 LAB — HIV ANTIBODY (ROUTINE TESTING W REFLEX): HIV Screen 4th Generation wRfx: NONREACTIVE

## 2019-12-04 LAB — LEGIONELLA PNEUMOPHILA SEROGP 1 UR AG: L. pneumophila Serogp 1 Ur Ag: NEGATIVE

## 2019-12-04 LAB — VITAMIN B12: Vitamin B-12: 1540 pg/mL — ABNORMAL HIGH (ref 180–914)

## 2019-12-04 LAB — FOLATE: Folate: 8.4 ng/mL (ref >5.9)

## 2019-12-04 NOTE — Progress Notes (Signed)
Lower extremity arterial duplex has been completed.   Preliminary results in CV Proc.   Blanch Media 12/04/2019 3:44 PM

## 2019-12-04 NOTE — Progress Notes (Signed)
Modified Barium Swallow Progress Note  Patient Details  Name: Matthew [name] MRN: [id number] Date of Birth: 1950/11/03  Today's Date: 12/04/2019  Modified Barium Swallow completed.  Full report located under Chart Review in the Imaging Section.  Brief recommendations include the following:  Clinical Impression Mr. Matthew [name] demonstrates a moderate oropharyngeal dysphagia 2/2 generalized weakness, failure to thrive status, and known h/o ACDF surgery with significant hardware in place.  Oral phase is c/b: -edentulous status with pt deferring solids Pharyngeal phase is c/b: -generalized weakness of all pharyngeal structures -reduced epiglottic inversion/laryngeal vestibule closure, resulting in trace aspiration of thin liquids prior to the swallow -reduced pharyngeal constriction, resulting in moderate residue throughout pharynx, eventually resulting in trace aspiration across all consistencies -reduced sensation, with no effort made to clear aspiration -pt unable to complete volitional cough/throat clear d/t difficulty following commands and pain with cough from broken ribs  Overall, pt is at high risk for aspiration and presently has aspiration pneumonia. Oral care is poor and pt is edentulous. Suspect based on no acute event causing dysphagia that aspiration is likely chronic in nature, just exacerbated by inability to cough to clear aspiration from rib fx and general debility. Per discussion with MD, recommending pt continue thin liquids and pureed solids with diligent oral care QID to mitigate further PNA. Findings discussed with pt, MD, RN.   Swallow Evaluation Recommendations     SLP Diet Recommendations: Dysphagia 1 (Puree) solids;Thin liquid   Liquid Administration via: Cup;Straw   Medication Administration: Crushed with puree   Supervision: Staff to assist with self feeding   Compensations: Small sips/bites;Follow solids with liquid   Postural Changes: Seated upright at 90  degrees   Oral Care Recommendations: Oral care QID       Matthew [name], M.S., CCC-SLP Speech-Language Pathologist Acute Rehabilitation Services Pager: [phone]  Matthew [name] 12/04/2019,10:14 AM

## 2019-12-04 NOTE — Progress Notes (Signed)
CSW attempted to meet with pt for SNF consult. Pt out of room for diagnostic procedure.

## 2019-12-04 NOTE — Progress Notes (Signed)
Vascular ABI study completed.   See Cv Proc for preliminary results.   Jean Rosenthal
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
## 2019-12-04 NOTE — Progress Notes (Signed)
Spoke with patient's friend Jodene Nam who he gave [place] permission to reach out to and discuss how he is doing.  After failing her illness situation she gives me more detailed history of events for what has been going on.  She reports that he had vascular surgery on his legs few months ago and that since that time he has been unable to walk.  Before the surgery he would go and walk the dog or "walk around in the yard".  After the surgery she says that he stayed in his room all the time.  She reports that he has had increased difficulty walking since that time.  She also reports that he has been staying in his room and urinating in a bottle as well as having issues with bowel incontinence.  She says that she fixed dinner for him every night but that he would get the food and not actually eat it.  She says that he never complained of blood in his urine or stool.  She does acknowledge that he has had a chronic cough for the past several months and she is unsure of why.  She agrees with the patient's recollection regarding his falls and says that they are due to weakness rather than syncopal events or seizures.  She has questions regarding if he will be going to a rehab facility and questions regarding his prognosis.  I updated her on the consults that have been placed for the patient and the next steps in management.  We discussed the fact that the patient has a very weak heart as well as several other acute issues on top of his chronic malnourishment and wasting making his care tenuous to navigate in meeting we have to move in a calculated fashion.  She has no other questions or concerns at this time and appreciates the updates.

## 2019-12-04 NOTE — NC FL2 (Signed)
[place] MEDICAID FL2 LEVEL OF CARE SCREENING TOOL     IDENTIFICATION  Patient Name: Matthew [name] Birthdate: [birth year] Sex: male Admission Date (Current Location): 12/02/2019  [facility] and [place] Number:  Producer, television/film/video and Address:  [facility], [address], [place], [place] [address]      Provider Number: [id number]  Attending Physician Name and Address:  McDiarmid, Leighton Roach, MD  Relative Name and Phone Number:  [facility]) [phone] (Mobile)    Current Level of Care: Hospital Recommended Level of Care: Skilled Nursing Facility Prior Approval Number:    Date Approved/Denied:   PASRR Number: [id number]  Discharge Plan: SNF    Current Diagnoses: Patient Active Problem List   Diagnosis Date Noted   Severe protein-calorie malnutrition (HCC) 12/03/2019   High liver transaminase level 12/03/2019   AKI (acute kidney injury) (HCC) 12/03/2019   Thrombocytopenia (HCC) 12/03/2019   Hypoalbuminemia 12/03/2019   Hyperbilirubinemia 12/03/2019   Tobacco abuse 12/03/2019   Rib fractures 12/03/2019   Leukocytosis 12/03/2019   Elevated protime 12/03/2019   AAA (abdominal aortic aneurysm) (HCC) 12/03/2019   Community acquired pneumonia 12/03/2019   Weight loss, unintentional 12/03/2019   Intrahepatic cholestasis 12/03/2019   Cognitive impairment 12/03/2019   Edentulous 12/03/2019   Immobility due to severe physical disability or frailty (HCC) 12/03/2019   Liver disorder 12/03/2019   Long-term current use of benzodiazepine 12/03/2019   Generalized muscle weakness 12/03/2019   Acute hepatitis    Elevated LFTs    Adult failure to thrive 12/02/2019   Arterial leg ulcer (HCC) 09/28/2019   PAD (peripheral artery disease) (HCC) 08/06/2019   Syrinx of spinal cord (HCC) 03/30/2018   Stenosis of cervical spine with myelopathy (HCC) 05/07/2013   Gait disorder 05/07/2013    Orientation RESPIRATION  BLADDER Height & Weight     Self, Situation, Place  O2 (2l/min) External catheter Weight: 114 lb 3.2 oz (51.8 kg) Height:     BEHAVIORAL SYMPTOMS/MOOD NEUROLOGICAL BOWEL NUTRITION STATUS   (none)  (none) Incontinent Diet (see d/c summary)  AMBULATORY STATUS COMMUNICATION OF NEEDS Skin   Extensive Assist Verbally PU Stage and Appropriate Care (Ankle Left pad ulcer, Ankle Right pad ulcer)                       Personal Care Assistance Level of Assistance  Bathing, Feeding, Dressing Bathing Assistance: Limited assistance Feeding assistance: Independent Dressing Assistance: Limited assistance     Functional Limitations Info  Sight, Hearing, Speech Sight Info: Adequate Hearing Info: Adequate Speech Info: Adequate    SPECIAL CARE FACTORS FREQUENCY  OT (By licensed OT), PT (By licensed PT)     PT Frequency: 5x week OT Frequency: 5x week            Contractures Contractures Info: Not present    Additional Factors Info  Code Status, Allergies Code Status Info: full code Allergies Info: Penicillins, codeines, Contrast Media (iodinated Diagnostic Agents)           Current Medications (12/04/2019):  This is the current hospital active medication list Current Facility-Administered Medications  Medication Dose Route Frequency Provider Last Rate Last Admin   aspirin EC tablet 81 mg  81 mg Oral Daily Derrel Nip, MD   81 mg at 12/04/19 1012   cefTRIAXone (ROCEPHIN) 1 g in sodium chloride 0.9 % 100 mL IVPB  1 g Intravenous Q24H Derrel Nip, MD 200 mL/hr at 12/03/19 1554 1 g at 12/03/19 1554  diazepam (VALIUM) tablet 2 mg  2 mg Oral QHS PRN Derrel Nip, MD   2 mg at 12/03/19 2213   diclofenac Sodium (VOLTAREN) 1 % topical gel 2 g  2 g Topical QID Mirian Mo, MD   2 g at 12/04/19 1013   enoxaparin (LOVENOX) injection 30 mg  30 mg Subcutaneous Q24H Derrel Nip, MD   30 mg at 12/03/19 1554   feeding supplement (ENSURE ENLIVE) (ENSURE ENLIVE) liquid  237 mL  237 mL Oral TID BM McDiarmid, Leighton Roach, MD   237 mL at 12/04/19 1012   multivitamin with minerals tablet 1 tablet  1 tablet Oral Daily McDiarmid, Leighton Roach, MD   1 tablet at 12/04/19 1012   pregabalin (LYRICA) capsule 50 mg  50 mg Oral QHS Mirian Mo, MD   50 mg at 12/03/19 2137     Discharge Medications: Please see discharge summary for a list of discharge medications.  Relevant Imaging Results:  Relevant Lab Results:   Additional Information SSN [id number] [place], [place]

## 2019-12-04 NOTE — Progress Notes (Addendum)
  Progress Note    12/04/2019 8:25 AM  Subjective:  L foot pain.  No other complaints at this time   Vitals:   12/04/19 0000 12/04/19 0733  BP: 102/78 101/65  Pulse:  81  Resp:  16  Temp:  97.9 F (36.6 C)  SpO2:  98%   Physical Exam: Lungs:  Non labored Extremities:  Dusky discolored toes BLE with mottling of lower legs; palpable popliteal pulses bilaterally Neurologic: A&O  CBC    Component Value Date/Time   WBC 12.0 (H) 12/04/2019 0632   RBC 4.59 12/04/2019 0632   HGB 13.2 12/04/2019 0632   HCT 41.2 12/04/2019 0632   PLT 121 (L) 12/04/2019 0632   MCV 89.8 12/04/2019 0632   MCH 28.8 12/04/2019 0632   MCHC 32.0 12/04/2019 0632   RDW 20.6 (H) 12/04/2019 0632   LYMPHSABS 3.4 08/21/2019 1705   MONOABS 0.8 08/21/2019 1705   EOSABS 0.4 08/21/2019 1705   BASOSABS 0.1 08/21/2019 1705    BMET    Component Value Date/Time   NA 140 12/04/2019 0632   K 4.0 12/04/2019 0632   CL 109 12/04/2019 0632   CO2 16 (L) 12/04/2019 0632   GLUCOSE 116 (H) 12/04/2019 0632   BUN 79 (H) 12/04/2019 0632   CREATININE 2.00 (H) 12/04/2019 0632   CALCIUM 9.8 12/04/2019 0632   GFRNONAA 33 (L) 12/04/2019 0632   GFRAA 38 (L) 12/04/2019 5093    INR    Component Value Date/Time   INR 1.7 (H) 12/03/2019 0906     Intake/Output Summary (Last 24 hours) at 12/04/2019 0825 Last data filed at 12/04/2019 0600 Gross per 24 hour  Intake 1505.59 ml  Output 650 ml  Net 855.59 ml     Assessment/Plan:  69 y.o. male with BLE PAD with history of bilateral iliac stenting and L SFS stent  Patient has been considered for angiography in the recent past given ongoing R foot and L pretibial wounds; now with dusky toes of BLE Primary service working up liver and kidney disease currently Timing of angiography based on clinical improvement; Dr. Randie Heinz will evaluate the patient later today    Emilie Rutter, PA-C [facility] [phone] 12/04/2019 8:25 AM  I have independently  interviewed and examined patient and agree with PA assessment and plan above. Readily palpable popliteal pulses suggest sfa and CIA stent patency. Will obtain ble duplex and ABI. May need angiography if he clinically improves.   Glady Ouderkirk C. Randie Heinz, MD [facility] [place] Office: [phone] Pager: [phone]
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
## 2019-12-04 NOTE — Consult Note (Signed)
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 after swallow;Trace aspiration;Pharyngeal residue - valleculae;Pharyngeal residue - pyriform Pharyngeal Material enters airway, passes BELOW cords without attempt by patient to eject out (silent aspiration) Pharyngeal- Thin Straw Reduced epiglottic inversion;Reduced pharyngeal peristalsis;Reduced anterior laryngeal mobility;Reduced laryngeal elevation;Reduced tongue base retraction;Reduced airway/laryngeal closure;Penetration/Aspiration before swallow;Penetration/Apiration after swallow;Trace aspiration;Pharyngeal residue - valleculae;Pharyngeal residue - pyriform Pharyngeal Material enters airway, passes BELOW cords without attempt by patient to eject out (silent aspiration) Pharyngeal- Puree Trace aspiration;Reduced pharyngeal peristalsis;Reduced epiglottic inversion;Reduced anterior laryngeal mobility;Reduced laryngeal elevation;Reduced airway/laryngeal  closure;Reduced tongue base retraction;Penetration/Apiration after swallow;Pharyngeal residue - valleculae;Pharyngeal residue - pyriform Pharyngeal  Material enters airway, CONTACTS cords and not ejected out  CHL IP CERVICAL ESOPHAGEAL PHASE 12/04/2019 Cervical Esophageal Phase [facility] [place] P. Isenhour, M.S., CCC-SLP Speech-Language Pathologist Acute Rehabilitation Services Pager: [phone] Susanne Borders Isenhour 12/04/2019, 10:03 AM              ECHOCARDIOGRAM COMPLETE  Result Date: 12/03/2019    ECHOCARDIOGRAM REPORT   Patient Name:   Matthew [name] Date of Exam: 12/03/2019 Medical Rec #:  [id number]        Height:       72.0 in Accession #:    [id number]       Weight:       114.2 lb Date of Birth:  Nov 02, 1950        BSA:          1.679 m Patient Age:    68 years         BP:           91/69 mmHg Patient Gender: M                HR:           69 bpm. Exam Location:  Inpatient Procedure: 2D Echo Indications:    Cardiomegaly I51.7; Chest Pain  History:        Patient has no prior history of Echocardiogram examinations.                 Risk Factors:Hypertension.  Sonographer:    Thurman Coyer RDCS (AE) Referring Phys: [id number] TODD D MCDIARMID IMPRESSIONS  1. Left ventricular ejection fraction, by estimation, is 20 to 25%. The left ventricle has severely decreased function. The left ventricle demonstrates global hypokinesis. The left ventricular internal cavity size was severely dilated. Indeterminate diastolic filling due to E-A fusion.  2. Right ventricular systolic function is severely reduced. The right ventricular size is mildly enlarged. There is moderately elevated pulmonary artery systolic pressure.  3. Left atrial size was severely dilated.  4. Right atrial size was mildly dilated.  5. . Moderate to severe mitral valve regurgitation.  6. The aortic valve is normal in structure. Aortic valve regurgitation is mild.  7. The inferior vena cava is dilated in size with <50% respiratory variability, suggesting right atrial pressure of 15 mmHg.  8. Large pleural effusion in the left lateral region. FINDINGS  Left Ventricle: Left ventricular ejection fraction, by  estimation, is 20 to 25%. The left ventricle has severely decreased function. The left ventricle demonstrates global hypokinesis. The left ventricular internal cavity size was severely dilated. There is no left ventricular hypertrophy. Indeterminate diastolic filling due to E-A fusion. Right Ventricle: The right ventricular size is mildly enlarged. No increase in right ventricular wall thickness. Right ventricular systolic function is severely reduced. There is moderately elevated pulmonary artery systolic pressure. The tricuspid regurgitant velocity is 2.74 m/s, and with an assumed right atrial pressure of 15 mmHg, the estimated right ventricular systolic pressure is 45.0 mmHg. Left Atrium: Left atrial size was severely dilated. Right Atrium: Right atrial size was mildly dilated. Pericardium: There is no evidence of pericardial effusion. Mitral Valve: The mitral valve is abnormal. There is mild thickening of the mitral valve leaflet(s). Moderate to severe mitral valve regurgitation, with posteriorly-directed jet. Tricuspid Valve: The tricuspid valve is normal in structure. Tricuspid valve regurgitation is mild. Aortic Valve: The aortic valve is normal in structure. Aortic valve regurgitation is mild. Aortic  regurgitation PHT measures 220 msec. Pulmonic Valve: The pulmonic valve was not well visualized. Pulmonic valve regurgitation is not visualized. Aorta: The aortic root is normal in size and structure. Venous: The inferior vena cava is dilated in size with less than 50% respiratory variability, suggesting right atrial pressure of 15 mmHg. IAS/Shunts: No atrial level shunt detected by color flow Doppler. Additional Comments: There is a large pleural effusion in the left lateral region.  LEFT VENTRICLE PLAX 2D LVIDd:         6.10 cm      Diastology LVIDs:         5.40 cm      LV e' lateral:   8.45 cm/s LV PW:         1.00 cm      LV E/e' lateral: 11.1 LV IVS:        1.00 cm      LV e' medial:    4.88 cm/s LVOT  diam:     2.00 cm      LV E/e' medial:  19.2 LV SV:         35 LV SV Index:   21 LVOT Area:     3.14 cm  LV Volumes (MOD) LV vol d, MOD A2C: 295.0 ml LV vol d, MOD A4C: 245.0 ml LV vol s, MOD A2C: 220.0 ml LV vol s, MOD A4C: 186.0 ml LV SV MOD A2C:     75.0 ml LV SV MOD A4C:     245.0 ml LV SV MOD BP:      76.8 ml RIGHT VENTRICLE RV S prime:     8.93 cm/s TAPSE (M-mode): 1.4 cm LEFT ATRIUM              Index       RIGHT ATRIUM           Index LA diam:        4.10 cm  2.44 cm/m  RA Area:     16.90 cm LA Vol (A2C):   142.0 ml 84.57 ml/m RA Volume:   52.10 ml  31.03 ml/m LA Vol (A4C):   130.0 ml 77.43 ml/m LA Biplane Vol: 136.0 ml 81.00 ml/m  AORTIC VALVE LVOT Vmax:   82.50 cm/s LVOT Vmean:  48.800 cm/s LVOT VTI:    0.112 m AI PHT:      220 msec  AORTA Ao Root diam: 3.60 cm MITRAL VALVE                 TRICUSPID VALVE MV Area (PHT): 4.41 cm      TR Peak grad:   30.0 mmHg MV Decel Time: 172 msec      TR Vmax:        274.00 cm/s MR Peak grad:    83.5 mmHg MR Mean grad:    50.0 mmHg   SHUNTS MR Vmax:         457.00 cm/s Systemic VTI:  0.11 m MR Vmean:        325.0 cm/s  Systemic Diam: 2.00 cm MR PISA:         2.26 cm MR PISA Eff ROA: 17 mm MR PISA Radius:  0.60 cm MV E velocity: 93.50 cm/s MV A velocity: 56.90 cm/s MV E/A ratio:  1.64 Dietrich Pates MD Electronically signed by Dietrich Pates MD Signature Date/Time: 12/03/2019/8:06:50 PM    Final    US Abdomen Limited RUQ  Result Date: 12/03/2019 CLINICAL DATA:  Acute hepatitis. EXAM: ULTRASOUND  ABDOMEN LIMITED RIGHT UPPER QUADRANT COMPARISON:  CT scan dated 12/02/2019 FINDINGS: Gallbladder: Numerous gallstones. Slight thickening of the gallbladder wall to a diameter of 3.4 mm. Negative sonographic Murphy's sign. Small amount of pericholecystic fluid. Common bile duct: Diameter: 4.3 mm, normal. Liver: Two small simple cysts in the left lobe, 16 mm and 7 mm. Within normal limits in parenchymal echogenicity. Portal vein is patent on color Doppler imaging with normal  direction of blood flow towards the liver. Other: Ascites. Right pleural effusion. Distended hepatic veins suggesting elevated right heart pressure. IMPRESSION: 1. Cholelithiasis. 2. Ascites. 3. Distended hepatic veins consistent with elevated right heart pressure. 4. Right pleural effusion. Electronically Signed   By: Francene Boyers M.D.   On: 12/03/2019 09:40       HEAR Score (for undifferentiated chest pain):     5   Assessment and Plan:   1. New cardiomyopathy with EF 20-25%, and severely dilated LV , unable to add meds with borderline BP,  If BP remains stable could add BB first., no rales,  May need to repeat CXR to eval for pl effusions.  May have CAD with PAD, cannot add statin with elevated LFTs no ACE or ARB due to AKI 2. New RHF with systolic function severely reduced.  RV is mildly enlarged. May be adding to elevated LFTs. 3.  mod to severe MR with dilated LV  4. NSVT and PVCs and PSVT K+ and Mg+ normal 5. Aspiration PNA per FM  6. PAD with arterial leg ulcer per vascular with plan for cath when AKI improves. 7. AKI  Cr 2.00 was 1.80 on admit  8. Elevated troponin may be demand ischemia -he made mention of chest pain when asked, cannot cath with AKI  9. LBBB no old EKG to compare  10. Liver disease with elevated LFTs and ascites may be from Rt heart failure.  11. Elevated TSH at 10 per FM      For questions or updates, please contact [facility] Please consult [email or web address] for contact info under    Signed, Nada Boozer, NP  12/04/2019 2:07 PM

## 2019-12-05 DIAGNOSIS — Z7189 Other specified counseling: Secondary | ICD-10-CM

## 2019-12-05 DIAGNOSIS — Z515 Encounter for palliative care: Secondary | ICD-10-CM

## 2019-12-05 LAB — COMPREHENSIVE METABOLIC PANEL
ALT: 479 U/L — ABNORMAL HIGH (ref 0–44)
AST: 381 U/L — ABNORMAL HIGH (ref 15–41)
Albumin: 2.6 g/dL — ABNORMAL LOW (ref 3.5–5.0)
Alkaline Phosphatase: 120 U/L (ref 38–126)
Anion gap: 17 — ABNORMAL HIGH (ref 5–15)
BUN: 89 mg/dL — ABNORMAL HIGH (ref 8–23)
CO2: 15 mmol/L — ABNORMAL LOW (ref 22–32)
Calcium: 9.9 mg/dL (ref 8.9–10.3)
Chloride: 108 mmol/L (ref 98–111)
Creatinine, Ser: 2.04 mg/dL — ABNORMAL HIGH (ref 0.61–1.24)
GFR calc Af Amer: 37 mL/min — ABNORMAL LOW (ref >60)
GFR calc non Af Amer: 32 mL/min — ABNORMAL LOW (ref >60)
Glucose, Bld: 103 mg/dL — ABNORMAL HIGH (ref 70–99)
Potassium: 5.2 mmol/L — ABNORMAL HIGH (ref 3.5–5.1)
Sodium: 140 mmol/L (ref 135–145)
Total Bilirubin: 3.6 mg/dL — ABNORMAL HIGH (ref 0.3–1.2)
Total Protein: 5.1 g/dL — ABNORMAL LOW (ref 6.5–8.1)

## 2019-12-05 LAB — CBC
HCT: 40.6 % (ref 39.0–52.0)
Hemoglobin: 12.9 g/dL — ABNORMAL LOW (ref 13.0–17.0)
MCH: 28.8 pg (ref 26.0–34.0)
MCHC: 31.8 g/dL (ref 30.0–36.0)
MCV: 90.6 fL (ref 80.0–100.0)
Platelets: 121 10*3/uL — ABNORMAL LOW (ref 150–400)
RBC: 4.48 MIL/uL (ref 4.22–5.81)
RDW: 20.8 % — ABNORMAL HIGH (ref 11.5–15.5)
WBC: 12.3 10*3/uL — ABNORMAL HIGH (ref 4.0–10.5)
nRBC: 0.2 % (ref 0.0–0.2)

## 2019-12-05 LAB — MAGNESIUM: Magnesium: 2.4 mg/dL (ref 1.7–2.4)

## 2019-12-05 LAB — PHOSPHORUS: Phosphorus: 4.5 mg/dL (ref 2.5–4.6)

## 2019-12-05 MED ORDER — ACETAMINOPHEN 325 MG PO TABS
650.0000 mg | ORAL_TABLET | Freq: Three times a day (TID) | ORAL | Status: DC
  Administered 2019-12-05 – 2019-12-06 (×3): 650 mg via ORAL
  Filled 2019-12-05 (×2): qty 2

## 2019-12-05 MED ORDER — MIRTAZAPINE 15 MG PO TABS
7.5000 mg | ORAL_TABLET | Freq: Every day | ORAL | Status: DC
  Administered 2019-12-05: 7.5 mg via ORAL
  Filled 2019-12-05: qty 1

## 2019-12-05 NOTE — Progress Notes (Addendum)
Pt set up in recliner for 2 hours today

## 2019-12-05 NOTE — Progress Notes (Addendum)
  Progress Note    12/05/2019 8:07 AM * No surgery found *  Subjective:  No complaints this morning. States he had a dream he was " Hot" trying to take off his gown    Vitals:   12/04/19 1601 12/04/19 2316  BP: 100/84 100/76  Pulse: 95 89  Resp: 16 14  Temp: 98.2 F (36.8 C) 98 F (36.7 C)  SpO2: 98% 98%   Physical Exam: General: cachetic, not in any distress Cardiac: regular Lungs: non labored Extremities: 2+ femoral pulses, popliteal pulses bilaterally. Left Posterior tibial pulse palpable. No palpable DP pulse bilaterally. Left foot edematous with cyanosis and duskiness of the toes. Also on right foot. Dry eschar on dorsum of right foot. Pretibial ulceration on left leg. Forefeet cool to palpation. Motor and sensory intact Abdomen: flat, non tender Neurologic:alert and oriented  CBC    Component Value Date/Time   WBC 12.3 (H) 12/05/2019 0351   RBC 4.48 12/05/2019 0351   HGB 12.9 (L) 12/05/2019 0351   HCT 40.6 12/05/2019 0351   PLT 121 (L) 12/05/2019 0351   MCV 90.6 12/05/2019 0351   MCH 28.8 12/05/2019 0351   MCHC 31.8 12/05/2019 0351   RDW 20.8 (H) 12/05/2019 0351   LYMPHSABS 3.4 08/21/2019 1705   MONOABS 0.8 08/21/2019 1705   EOSABS 0.4 08/21/2019 1705   BASOSABS 0.1 08/21/2019 1705    BMET    Component Value Date/Time   NA 140 12/05/2019 0351   K 5.2 (H) 12/05/2019 0351   CL 108 12/05/2019 0351   CO2 15 (L) 12/05/2019 0351   GLUCOSE 103 (H) 12/05/2019 0351   BUN 89 (H) 12/05/2019 0351   CREATININE 2.04 (H) 12/05/2019 0351   CALCIUM 9.9 12/05/2019 0351   GFRNONAA 32 (L) 12/05/2019 0351   GFRAA 37 (L) 12/05/2019 0351    INR    Component Value Date/Time   INR 1.7 (H) 12/03/2019 0906    No intake or output data in the 24 hours ending 12/05/19 0807   Assessment/Plan:  69 y.o. male with bilateral lower extremity arterial occlusive disease with hx of prior bilateral iliac and left SFA stent. He has bilateral cyanosis and duskiness of his feet with  right foot ulcers and left pretibial wound that is non healing. He has palpable pulses to level of popliteal arteries bilaterally. He had ABI/TBI studies which were very limited due to wound dressings but arterial duplex shows runoff disease bilaterally. Possible Angiography once he is medically optimized  DVT prophylaxis: Lovenox   Graceann Congress, PA-C [facility] [phone] 12/05/2019 8:07 AM   I have independently interviewed and examined patient and agree with PA assessment and plan above.  ABIs and bilateral lower extremity duplex appear normal.  Appears to have small vessel disease and discoloration may be secondary to global malperfusion.  We will not plan angiography during this hospitalization can plan to see him back in our office in 4 to 6 weeks after discharge  Octavie Westerhold C. Randie Heinz, MD [facility] [place] Office: [phone] Pager: [phone]

## 2019-12-05 NOTE — Progress Notes (Signed)
  Speech Language Pathology Treatment: Dysphagia  Patient Details Name: Matthew [name] MRN: [id number] DOB: 1951/04/22 Today's Date: 12/05/2019 Time: 1152-1202 SLP Time Calculation (min) (ACUTE ONLY): 10 min  Assessment / Plan / Recommendation Clinical Impression  Pt aspirated all liquids during yesterday's MBS and seen today with po's. He is cachectic, dyspneic, weak and deconditioned who silently aspirated during MBS  Observation cannot fully determine his tolerance of po's and aspiration unfortunately cannot be prevented. His respiratory system does not support necessary volumes for efective cough. SLP did modify texture from puree to full liquids- he was not eating much of puree, stated liquids are easier to swallow and may have increased intake. Agree with Palliative care consult. Recommend diligent oral care to decrease oral bacteria, control the controllable of positioning, volume, rate. Will attempt to follow up once more since changed to full liquids.    HPI HPI: 69 yo male admitted to ED on 6/27 with AMS, FTT, chronic LUE/LLE wounds. CT head, abdomen/pelvis reveal ground glass and patchy lung opacities in R>L lower lobe suspect for PNA vs aspiration pneumonitis, L 8-9 rib fractures, no intracranial abnormalities. 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      SLP Plan  Continue with current plan of care       Recommendations  Diet recommendations: Thin liquid;Other(comment) (full liquids) Liquids provided via: Cup;Straw Medication Administration: Crushed with puree Supervision: Staff to assist with self feeding;Full supervision/cueing for compensatory strategies Compensations: Small sips/bites;Follow solids with liquid Postural Changes and/or Swallow Maneuvers: Seated upright 90 degrees                Oral Care Recommendations: Oral care QID Follow  up Recommendations: Skilled Nursing facility SLP Visit Diagnosis: Dysphagia, oropharyngeal phase (R13.12) Plan: Continue with current plan of care                       [name] 12/05/2019, 2:16 PM [name] [name].Ed Nurse, children's [phone] Office [phone]

## 2019-12-05 NOTE — TOC Initial Note (Signed)
Transition of Care [facility]) - Initial/Assessment Note    Patient Details  Name: Matthew [name] MRN: [id number] Date of Birth: 1951/01/19  Transition of Care [facility]) CM/SW Contact:    Baldemar Lenis, LCSW Phone Number: 12/05/2019, 2:56 PM  Clinical Narrative:     CSW attempted to meet with patient to discuss recommendation for SNF, patient responded to questions but did not seem to fully understand conversation. Patient agreed for CSW to contact roommate, Tamra. CSW spoke with Tamra to discuss recommendation for SNF and provide choice of bed offers. Tamra said she was confused because she thought that they were saying they couldn't do anything for his heart and she's meeting with palliative medicine team tomorrow, so she wasn't sure what the plan was at this point. CSW to wait and see results of palliative meeting tomorrow to determine disposition needs.   If patient should be appropriate for SNF placement at discharge, patient's Francine Graven is managed through [facility].               Expected Discharge Plan: Skilled Nursing Facility Barriers to Discharge: Insurance Authorization, Continued Medical Work up   Patient Goals and CMS Choice Patient states their goals for this hospitalization and ongoing recovery are:: patient unable to participate in goal setting due to disorientation CMS Medicare.gov Compare Post Acute Care list provided to:: Patient Represenative (must comment) Choice offered to / list presented to :  (Roommate)  Expected Discharge Plan and Services Expected Discharge Plan: Skilled Nursing Facility       Living arrangements for the past 2 months: Single Family Home                                      Prior Living Arrangements/Services Living arrangements for the past 2 months: Single Family Home Lives with:: Roommate Patient language and need for interpreter reviewed:: No Do you feel safe going back to the place where you live?: Yes      Need for Family  Participation in Patient Care: Yes (Comment) Care giver support system in place?: No (comment)   Criminal Activity/Legal Involvement Pertinent to Current Situation/Hospitalization: No - Comment as needed  Activities of Daily Living Home Assistive Devices/Equipment: None ADL Screening (condition at time of admission) Patient's cognitive ability adequate to safely complete daily activities?: Yes Is the patient deaf or have difficulty hearing?: Yes Does the patient have difficulty seeing, even when wearing glasses/contacts?: No Does the patient have difficulty concentrating, remembering, or making decisions?: No Patient able to express need for assistance with ADLs?: Yes Does the patient have difficulty dressing or bathing?: Yes Independently performs ADLs?: No Communication: Independent Dressing (OT): Dependent Is this a change from baseline?: Pre-admission baseline Grooming: Dependent Is this a change from baseline?: Pre-admission baseline Feeding: Independent Bathing: Dependent Is this a change from baseline?: Pre-admission baseline Toileting: Needs assistance Is this a change from baseline?: Pre-admission baseline In/Out Bed: Dependent Is this a change from baseline?: Pre-admission baseline Walks in Home: Needs assistance Is this a change from baseline?: Pre-admission baseline Does the patient have difficulty walking or climbing stairs?: Yes Weakness of Legs: Both Weakness of Arms/Hands: Both  Permission Sought/Granted Permission sought to share information with : Facility Medical sales representative, Family Supports Permission granted to share information with : Yes, Verbal Permission Granted  Share Information with NAME: Tamra  Permission granted to share info w AGENCY: SNF  Permission granted to share info  w Relationship: Roommate     Emotional Assessment Appearance:: Appears stated age Attitude/Demeanor/Rapport: Lethargic   Orientation: : Oriented to Self       Admission diagnosis:  Adult failure to thrive [R62.7] Elevated LFTs [R79.89] Failure to thrive in adult [R62.7] Closed fracture of multiple ribs of left side, initial encounter [S22.42XA] Altered mental status, unspecified altered mental status type [R41.82] Community acquired pneumonia, unspecified laterality [J18.9] Patient Active Problem List   Diagnosis Date Noted  . Acute systolic heart failure (HCC) 12/04/2019  . Mood disorder (HCC) 12/04/2019  . Oropharyngeal dysphagia 12/04/2019  . Right ventricular failure (HCC) 12/04/2019  . Mitral valve regurgitation 12/04/2019  . Left bundle branch block 12/04/2019  . Community acquired pneumonia   . Severe protein-calorie malnutrition (HCC) 12/03/2019  . High liver transaminase level 12/03/2019  . AKI (acute kidney injury) (HCC) 12/03/2019  . Thrombocytopenia (HCC) 12/03/2019  . Hypoalbuminemia 12/03/2019  . Hyperbilirubinemia 12/03/2019  . Rib fractures 12/03/2019  . Leukocytosis 12/03/2019  . Elevated protime 12/03/2019  . AAA (abdominal aortic aneurysm) (HCC) 12/03/2019  . Aspiration pneumonia (HCC) 12/03/2019  . Weight loss, unintentional 12/03/2019  . Intrahepatic cholestasis 12/03/2019  . Cognitive impairment 12/03/2019  . Edentulous 12/03/2019  . Immobility due to severe physical disability or frailty (HCC) 12/03/2019  . Liver disorder 12/03/2019  . Long-term current use of benzodiazepine 12/03/2019  . Generalized muscle weakness 12/03/2019  . Acute hepatitis   . Elevated LFTs   . Adult failure to thrive 12/02/2019  . Arterial leg ulcer (HCC) 09/28/2019  . PAD (peripheral artery disease) (HCC) 08/06/2019  . Syrinx of spinal cord (HCC) 03/30/2018  . Stenosis of cervical spine with myelopathy (HCC) 05/07/2013  . Gait disorder 05/07/2013   PCP:  Moses Manners, MD Pharmacy:   [facility] [id number] [place], [place] - [phone] AT [facility] [address] [place]  [address] Phone: [phone] Fax: [phone]     Social Determinants of Health (SDOH) Interventions    Readmission Risk Interventions No flowsheet data found.

## 2019-12-05 NOTE — Progress Notes (Signed)
Occupational Therapy Treatment Patient Details Name: Matthew [name] MRN: [id number] DOB: 15-Dec-1950 Today's Date: 12/05/2019    History of present illness 69 yo male admitted to ED on 6/27 with AMS, FTT, chronic LUE/LLE wounds. CT head, abdomen/pelvis reveal ground glass and patchy lung opacities in R>L lower lobe suspect for PNA vs aspiration pneumonitis, L 8-9 rib fractures, no intracranial abnormalities. 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   OT comments  Pt is progressing well toward his OT goals with improvement in functional mobility. Pt with very flat affect throughout session but agreeable to transfer out of bed. Pt required mod A to stand from EOB with cueing required for UE placement. Min A to stand pivot with incontinent BM- pt aware. BP 90/74 in chair, pt asymptomatic and all other VSS. SNF remains appropriate for safety.    Follow Up Recommendations  SNF;Supervision/Assistance - 24 hour    Equipment Recommendations  Other (comment) (defer to next venue)       Precautions / Restrictions Precautions Precautions: Fall Restrictions Weight Bearing Restrictions: No       Mobility Bed Mobility Overal bed mobility: Needs Assistance Bed Mobility: Supine to Sit;Sit to Supine Rolling: Min assist   Supine to sit: Mod assist Sit to supine: Mod assist   General bed mobility comments: Mod A to advance BLE to EOB, min A to lift trunk off bed  Transfers Overall transfer level: Needs assistance Equipment used: Rolling walker (2 wheeled) Transfers: Sit to/from [employer] Sit to Stand: Mod assist Stand pivot transfers: Min assist       General transfer comment: Mod A to power up from bed, min A to stand pivot to recliner    Balance Overall balance assessment: Needs assistance;History of Falls Sitting-balance support: Feet  supported;Single extremity supported Sitting balance-Leahy Scale: Fair Sitting balance - Comments: able to sit EOB without assist   Standing balance support: During functional activity;Bilateral upper extremity supported Standing balance-Leahy Scale: Poor Standing balance comment: reliant on external assist statically and dynamically                           ADL either performed or assessed with clinical judgement   ADL Overall ADL's : Needs assistance/impaired Eating/Feeding: Set up;Sitting   Grooming: Set up;Sitting       Lower Body Bathing: Maximal assistance;Sit to/from stand Lower Body Bathing Details (indicate cue type and reason): Max A for peri care in standing                       General ADL Comments: Pt with global weakness that impacts his ability to complete all ADLs               Cognition Arousal/Alertness: Awake/alert Behavior During Therapy: Flat affect Overall Cognitive Status: No family/caregiver present to determine baseline cognitive functioning Area of Impairment: Following commands;Safety/judgement;Problem solving                       Following Commands: Follows one step commands with increased time;Follows one step commands inconsistently Safety/Judgement: Decreased awareness of deficits;Decreased awareness of safety   Problem Solving: Decreased initiation;Difficulty sequencing;Requires verbal cues;Requires tactile cues General Comments: Pt with very flat affect but following all commands and apologizing for incontinent BM  General Comments BP 90/74 once sitting in chair, NT made aware to reassess, pt asymptomatic     Pertinent Vitals/ Pain       Pain Assessment: No/denies pain Faces Pain Scale: Hurts a little bit Pain Intervention(s): Monitored during session     Prior Functioning/Environment              Frequency  Min 2X/week        Progress Toward Goals  OT Goals(current goals  can now be found in the care plan section)  Progress towards OT goals: Progressing toward goals  Acute Rehab OT Goals Patient Stated Goal: None stated OT Goal Formulation: With patient Time For Goal Achievement: 12/17/19 Potential to Achieve Goals: Fair  Plan Discharge plan remains appropriate       AM-PAC OT "6 Clicks" Daily Activity     Outcome Measure   Help from another person eating meals?: A Little Help from another person taking care of personal grooming?: A Little Help from another person toileting, which includes using toliet, bedpan, or urinal?: Total Help from another person bathing (including washing, rinsing, drying)?: A Lot Help from another person to put on and taking off regular upper body clothing?: A Little Help from another person to put on and taking off regular lower body clothing?: Total 6 Click Score: 13    End of Session Equipment Utilized During Treatment: Rolling walker;Oxygen  OT Visit Diagnosis: Unsteadiness on feet (R26.81);Other abnormalities of gait and mobility (R26.89);Muscle weakness (generalized) (M62.81);History of falling (Z91.81)   Activity Tolerance Patient limited by fatigue   Patient Left in chair;with call bell/phone within reach;with nursing/sitter in room   Nurse Communication Mobility status        Time: 0630-1601 OT Time Calculation (min): 22 min  Charges: OT General Charges $OT Visit: 1 Visit OT Treatments $Self Care/Home Management : 8-22 mins  Crissie Reese OTR/L  12/05/2019, 3:08 PM

## 2019-12-05 NOTE — Progress Notes (Signed)
Family Medicine Teaching Service Daily Progress Note Intern Pager: [phone]  Patient name: Matthew [name] Medical record number: [id number] Date of birth: September 23, 1950 Age: 69 y.o. Gender: male  Primary Care Provider: Moses Manners, MD Consultants: Nutrition Code Status: Full code  Pt Overview and Major Events to Date:  Admitted 6/27  Assessment and Plan:  Matthew [name] is a 69 y.o. male presenting with altered mental status and failure to thrive. PMH is significant for chronic pain, PAD, anxiety disorder, cervical spondylosis without myelopathy, history of stroke.  Aspiration pneumonia On admission patient denied any shortness of breath or cough or congestion.  He had his chest CT which showed findings acute for multifocal pneumonia.  Speech evaluated the patient with IM modified barium swallow and identified microaspirations which they feel is most likely a chronic issue.  WBCs are stable at 12.3 from 12.0 yesterday.  Denies any shortness of breath this morning. -Continue ceftriaxone 1 g every 24 hours with a total duration of 5 days.  Last day is tomorrow 7/1. -Continue to hold azithromycin at this time -Continuous pulse ox   -Continuous cardiac monitoring -Monitor respiratory status -Encourage incentive spirometry  Acute on chronic liver failure On admission AST/ALT were elevated at 105/164 with a T bili of 4.8.  Patient reports remote history of IV drug use.  CT abdomen showed decompressed gallbladder with gallstones but no evidence of cholecystitis.  Patient still has mild right upper quadrant tenderness.  AST and ALT mildly increased this morning at 381/479 from 363/431.  Bili is essentially stable at 3.6 from 3.3 yesterday..  Hepatitis panel nonreactive -Continue to monitor morning CMP's -Avoid hepatotoxic medications  -Repeat PT/INR tomorrow  New HFrEF Echocardiogram 6/28 showed LVEF 20 to 25% with severely decreased left ventricular function.  Global hypokinesis.  LV  severely dilated with intermediate diastolic filling.  RV systolic function severely reduced.  Moderate to severe mitral valve regurgitation.  Moderately elevated pulmonary artery systolic pressure.  IVC is dilated with less than 50% respiratory variability.  Left bundle branch block on EKG.  Very difficult to determine volume status at this time.   -Given poor systolic function was be gentle with hydration -Discontinue IV fluids at this time -Cardiology consult, appreciate recommendations  Failure to thrive with recurrent falls Patient extremely cachectic on exam.  Per chart review the patient has lost approximately 50 pounds over the last year and a half.  It appears that his appetite has decreased since he has been injured but the weight loss has been going on for duration significantly longer than that.  Speech evaluated patient who completed modified barium swallow.  Noted generalized weakness of all pharyngeal structures with reduced epiglottic inversion resulting in trace aspiration of thin liquids prior to swallow. -Nutrition consulted, appreciate recommendations -Dysphagia 1 diet with thin liquids, staff to assist with self-feeding, sit upright at 90 degrees with oral care 4 times daily -Up with assistance -Fall precautions -Continuous pulse ox -Continuous cardiac monitoring -Monitor refeeding labs for at least 3 days -Morning CBC and CMP -Morning magnesium -Morning Phos -Daily weights -Follow-up on nutrition consult -PT recommended SNF -OT was unable to evaluate -Palliative care has been consulted but have not yet seen the patient.  AKI-stable, unresolved Baseline creatinine is around 1.5.  Creatinine on admission was elevated at 1.8.  This morning's creatinine was 2.04 from 2.06 yesterday. -Encourage p.o. intake -Morning BMPs -Avoid nephrotoxic agents -Holding fluids at this time but may require maintenance fluids  Peripheral vascular disease Patient has  longstanding history  of peripheral arterial disease.  Vascular was consulted and they are following. -Vascular consulted, appreciate recommendations -Dr. Pascal Lux is being made aware of the patient's admission -Vascular is not planning intervention during this hospitalization, may evaluate in 4 to 6 weeks after discharge  Anxiety Home medications include Valium  -Continue home medication however to avoid withdrawal -Initiating mirtazapine  Chronic pain Patient with history of chronic cervical pain which she reports having procedure done. Home medications include Lyrica 100 mg at bedtime. -Lyrica 50 mg at bedtime ordered  Hx of CVA Sometime in the 2000s. No currently medications  Hx of knee surgery Patient unsure of when this occurred but reports it was "years ago".  Hx of smoking Quit 17 years ago. 1 ppd   Hx of recreational drug use Not for the past 10 years. Pervious use of cocaine, heroin, MJ. -Monitor for signs and symptoms of withdrawal -Consider UDS  FEN/GI: Regular diet PPx: Lovenox  Disposition: Social work evaluation and determining cause of transaminitis  Subjective:  Patient reports his breathing has improved this morning at least he feels that way.  He reports he did not eat dinner last night but does not know why.  He is alert and oriented to person place and time.  He denies any pain and asks that his chest binder be loosened.  Objective: Temp:  [97.9 F (36.6 C)-98.2 F (36.8 C)] 98 F (36.7 C) (06/29 2316) Pulse Rate:  [81-95] 89 (06/29 2316) Resp:  [14-16] 14 (06/29 2316) BP: (100-101)/(65-84) 100/76 (06/29 2316) SpO2:  [98 %] 98 % (06/29 2316) Physical Exam:  General: Cachectic, lying in bed in no acute distress Cardiovascular: Regular rate, no murmurs appreciated Respiratory: Patient has normal work of breathing this morning.  Unable to appreciate crackles in lower lung fields today Abdomen: Flat, mild tenderness to palpation right upper quadrant no masses  appreciated Extremities: Edema in feet bilaterally from PVD  Laboratory: Recent Labs  Lab 12/03/19 0026 12/04/19 0632 12/05/19 0351  WBC 15.4* 12.0* 12.3*  HGB 13.4 13.2 12.9*  HCT 42.2 41.2 40.6  PLT 134* 121* 121*   Recent Labs  Lab 12/03/19 0906 12/04/19 0632 12/05/19 0351  NA 141 140 140  K 4.4 4.0 PENDING  CL 108 109 108  CO2 20* 16* 15*  BUN 86* 79* 89*  CREATININE 2.13* 2.00* 2.04*  CALCIUM 9.9 9.8 9.9  PROT 5.1* 5.2* 5.1*  BILITOT 4.2* 3.3* 3.6*  ALKPHOS 119 117 120  ALT 630* 431* 479*  AST 1,105* 363* 381*  GLUCOSE 95 116* 103*    DG Swallowing Func-Speech Pathology  Result Date: 12/04/2019 Objective Swallowing Evaluation: Type of Study: MBS-Modified Barium Swallow Study  Patient Details Name: Matthew [name] MRN: [id number] Date of Birth: [birth year] Today's Date: 12/04/2019 Time: SLP Start Time (ACUTE ONLY): 0900 -SLP Stop Time (ACUTE ONLY): 0935 SLP Time Calculation (min) (ACUTE ONLY): 35 min Past Medical History: Past Medical History: Diagnosis Date . Anxiety disorder  . Carpal tunnel syndrome   Bilateral . Carpal tunnel syndrome 02/06/2014 . Cervical spondylosis without myelopathy 05/07/2013 . Cholelithiasis 12/03/2019 . Complete immobility due to severe physical disability or frailty (HCC) 12/03/2019 . Foot fracture   right foot . Gait disorder 05/07/2013 . Hypertension  . Joint pain, knee  . PAD (peripheral artery disease) (HCC)  . Pain in joint, shoulder region  . Post traumatic stress disorder  . Restless leg syndrome  . Spinal stenosis in cervical region  . Spondylosis  . Stroke (HCC)  .  Syrinx of spinal cord (HCC) 03/30/2018  Thoracic Past Surgical History: Past Surgical History: Procedure Laterality Date . ABDOMINAL AORTAGRAM  08/06/2019  ABDOMINAL AORTOGRAM W/LOWER EXTREMITY . ABDOMINAL AORTOGRAM W/LOWER EXTREMITY N/A 08/06/2019  Procedure: ABDOMINAL AORTOGRAM W/LOWER EXTREMITY;  Surgeon: Maeola Harman, MD;  Location: [facility] INVASIVE CV LAB;  Service:  Cardiovascular;  Laterality: N/A; . ACDF    2009 or 2010 . FEMORAL ARTERY STENT Left   Superficial Femoral Artery stent; Dr B. Randie Heinz (Vasc) . ILIAC ARTERY STENT Bilateral 08/06/2019  Dr B. Randie Heinz (Vasc) . KNEE SURGERY  1980 HPI: 69 yo male admitted to ED on 6/27 with AMS, FTT, chronic LUE/LLE wounds. CT head, abdomen/pelvis reveal ground glass and patchy lung opacities in R>L lower lobe suspect for PNA vs aspiration pneumonitis, L 8-9 rib fractures, no intracranial abnormalities. 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  Subjective: Pt reports generalized pain and not resting last night. Assessment / Plan / Recommendation CHL IP CLINICAL IMPRESSIONS 12/04/2019 Clinical Impression Mr. Sesma demonstrates a moderate oropharyngeal dysphagia 2/2 generalized weakness, failure to thrive status, and known h/o ACDF surgery with significant hardware in place. Oral phase is c/b: - reduced labial seal, resulting in anterior spillage of thin liquids via cup -edentulous status with pt deferring solids -reduced lingual strength/control, resulting in piecemeal deglutition and mild oral residue after the swallow Pharyngeal phase is c/b: -generalized weakness of all pharyngeal structures -reduced BOT retraction, resulting in BOT residue across consistencies -reduced epiglottic inversion/laryngeal vestibule closure, resulting in trace aspiration of thin liquids prior to the swallow -reduced pharyngeal constriction, resulting in moderate residue throughout pharynx, eventually resulting in trace aspiration across all consistencies -reduced sensation, with no effort made to clear aspiration -pt unable to complete volitional cough/throat clear d/t difficulty following commands and pain with cough from broken ribs Esophageal phase is c/b: -timely clearance; no retrograde movement observed Overall, pt is at high risk  for aspiration and presently has aspiration pneumonia. Oral care is poor and pt is edentulous. Suspect based on no acute event causing dysphagia that aspiration is likely chronic in nature, just exacerbated by inability to cough to clear aspiration from rib fx and general debility. Per discussion with MD, recommending pt continue thin liquids and pureed solids with diligent oral care QID to mitigate further PNA. Findings discussed with pt, MD, RN. SLP Visit Diagnosis Dysphagia, oropharyngeal phase (R13.12) Impact on safety and function Severe aspiration risk   CHL IP TREATMENT RECOMMENDATION 12/04/2019 Treatment Recommendations Therapy as outlined in treatment plan below   Prognosis 12/04/2019 Prognosis for Safe Diet Advancement Guarded Barriers to Reach Goals Severity of deficits;Time post onset Barriers/Prognosis Comment Failure to Thrive CHL IP DIET RECOMMENDATION 12/04/2019 SLP Diet Recommendations Dysphagia 1 (Puree) solids;Thin liquid Liquid Administration via Cup;Straw Medication Administration Crushed with puree Compensations Small sips/bites;Follow solids with liquid Postural Changes Seated upright at 90 degrees   CHL IP OTHER RECOMMENDATIONS 12/04/2019 Recommended Consults Palliative Care Oral Care Recommendations Oral care QID Other Recommendations --   CHL IP FOLLOW UP RECOMMENDATIONS 12/04/2019 Follow up Recommendations Skilled Nursing facility   [facility] IP FREQUENCY AND DURATION 12/04/2019 Speech Therapy Frequency (ACUTE ONLY) min 2x/week Treatment Duration 2 weeks      CHL IP ORAL PHASE 12/04/2019 Oral Phase Impaired Oral - Thin Cup Right anterior bolus loss Oral - Thin Straw WFL Oral - Puree Lingual pumping;Lingual/palatal residue  CHL IP PHARYNGEAL PHASE 12/04/2019 Pharyngeal Phase Impaired Pharyngeal-  Honey Cup Reduced epiglottic inversion;Reduced pharyngeal peristalsis;Reduced anterior laryngeal mobility;Reduced laryngeal elevation;Reduced airway/laryngeal closure;Reduced tongue base  retraction;Penetration/Apiration after swallow;Trace aspiration Pharyngeal Material enters airway, passes BELOW cords without attempt by patient to eject out (silent aspiration) Pharyngeal- Nectar Cup Reduced epiglottic inversion;Reduced pharyngeal peristalsis;Reduced anterior laryngeal mobility;Reduced laryngeal elevation;Reduced airway/laryngeal closure;Reduced tongue base retraction;Penetration/Apiration after swallow;Trace aspiration;Pharyngeal residue - pyriform;Pharyngeal residue - valleculae Pharyngeal Material enters airway, passes BELOW cords without attempt by patient to eject out (silent aspiration) Pharyngeal- Nectar Straw Reduced epiglottic inversion;Reduced pharyngeal peristalsis;Reduced anterior laryngeal mobility;Reduced laryngeal elevation;Reduced airway/laryngeal closure;Reduced tongue base retraction;Penetration/Apiration after swallow;Trace aspiration;Pharyngeal residue - pyriform;Pharyngeal residue - valleculae Pharyngeal- Thin Cup Reduced epiglottic inversion;Reduced pharyngeal peristalsis;Reduced anterior laryngeal mobility;Reduced laryngeal elevation;Reduced airway/laryngeal closure;Reduced tongue base retraction;Penetration/Aspiration before swallow;Penetration/Apiration after swallow;Trace aspiration;Pharyngeal residue - valleculae;Pharyngeal residue - pyriform Pharyngeal Material enters airway, passes BELOW cords without attempt by patient to eject out (silent aspiration) Pharyngeal- Thin Straw Reduced epiglottic inversion;Reduced pharyngeal peristalsis;Reduced anterior laryngeal mobility;Reduced laryngeal elevation;Reduced tongue base retraction;Reduced airway/laryngeal closure;Penetration/Aspiration before swallow;Penetration/Apiration after swallow;Trace aspiration;Pharyngeal residue - valleculae;Pharyngeal residue - pyriform Pharyngeal Material enters airway, passes BELOW cords without attempt by patient to eject out (silent aspiration) Pharyngeal- Puree Trace aspiration;Reduced  pharyngeal peristalsis;Reduced epiglottic inversion;Reduced anterior laryngeal mobility;Reduced laryngeal elevation;Reduced airway/laryngeal closure;Reduced tongue base retraction;Penetration/Apiration after swallow;Pharyngeal residue - valleculae;Pharyngeal residue - pyriform Pharyngeal Material enters airway, CONTACTS cords and not ejected out  CHL IP CERVICAL ESOPHAGEAL PHASE 12/04/2019 Cervical Esophageal Phase [facility] P. Isenhour, M.S., CCC-SLP Speech-Language Pathologist Acute Rehabilitation Services Pager: [phone] Susanne Borders Isenhour 12/04/2019, 10:03 AM              VAS [place] ABI WITH/WO TBI  Result Date: 12/04/2019 LOWER EXTREMITY DOPPLER STUDY Indications: Peripheral artery disease.  Vascular Interventions: Stenting of LT superficial femoral artery and bilateral                         common iliac arteries by 08/2019. Comparison Study: 10-19-2019 Performing Technologist: Jean Rosenthal  Examination Guidelines: A complete evaluation includes at minimum, Doppler waveform signals and systolic blood pressure reading at the level of bilateral brachial, anterior tibial, and posterior tibial arteries, when vessel segments are accessible. Bilateral testing is considered an integral part of a complete examination. Photoelectric Plethysmograph (PPG) waveforms and toe systolic pressure readings are included as required and additional duplex testing as needed. Limited examinations for reoccurring indications may be performed as noted.  ABI Findings: +--------+-----------------+-----+----------+----------------------------------+ Right   Rt Pressure      IndexWaveform  Comment                                    (mmHg)                                                             +--------+-----------------+-----+----------+----------------------------------+ Brachial                      triphasic Rt pressure not taken due to  bandages                            +--------+-----------------+-----+----------+----------------------------------+ PTA     105              1.12 triphasic                                    +--------+-----------------+-----+----------+----------------------------------+ DP      98               1.04 monophasic                                   +--------+-----------------+-----+----------+----------------------------------+ +--------+-----------------+-----+----------+----------------------------------+ Left    Lt Pressure      IndexWaveform  Comment                                    (mmHg)                                                             +--------+-----------------+-----+----------+----------------------------------+ [id number]                    triphasic                                    +--------+-----------------+-----+----------+----------------------------------+ PTA                           triphasic Pressures not taken due to                                                 bandages.                          +--------+-----------------+-----+----------+----------------------------------+ PERO                                                                       +--------+-----------------+-----+----------+----------------------------------+ DP                            monophasicPressures not taken due to                                                 bandages.                          +--------+-----------------+-----+----------+----------------------------------+ +-------+-----------+-----------+------------+------------+  ABI/TBIToday's ABIToday's TBIPrevious ABIPrevious TBI +-------+-----------+-----------+------------+------------+ Right  1.12                  1.20                     +-------+-----------+-----------+------------+------------+ Left                         1.24                      +-------+-----------+-----------+------------+------------+  Summary: Right: Resting right ankle-brachial index is within normal range. No evidence of significant right lower extremity arterial disease. Left: Limited- unable to obtain pressures due to bandages. Waveforms obtained.  *See table(s) above for measurements and observations.  Electronically signed by Waverly Ferrarihristopher Dickson MD on 12/04/2019 at 7:41:08 PM.    Final    VAS US LOWER EXTREMITY ARTERIAL DUPLEX  Result Date: 12/04/2019 LOWER EXTREMITY ARTERIAL DUPLEX STUDY Indications: Peripheral artery disease, and preop. High Risk Factors: Hypertension, prior CVA.  Vascular Interventions: Stenting of his left superficial femoral artery and                         bilateral common iliac artery by 08/2019. Current ABI:            n/a Comparison Study: 09/28/19 previous Performing Technologist: Blanch MediaMegan Riddle RVS  Examination Guidelines: A complete evaluation includes B-mode imaging, spectral Doppler, color Doppler, and power Doppler as needed of all accessible portions of each vessel. Bilateral testing is considered an integral part of a complete examination. Limited examinations for reoccurring indications may be performed as noted.  +-----------+--------+-----+--------+----------+--------+ RIGHT      PSV cm/sRatioStenosisWaveform  Comments +-----------+--------+-----+--------+----------+--------+ DFA        63                   triphasic          +-----------+--------+-----+--------+----------+--------+ SFA Prox   48                   triphasic          +-----------+--------+-----+--------+----------+--------+ SFA Mid    71                   triphasic          +-----------+--------+-----+--------+----------+--------+ SFA Distal 134                  triphasic          +-----------+--------+-----+--------+----------+--------+ POP Prox   42                   triphasic           +-----------+--------+-----+--------+----------+--------+ TP Trunk   51                   triphasic          +-----------+--------+-----+--------+----------+--------+ ATA Distal 25                   monophasic         +-----------+--------+-----+--------+----------+--------+ PTA Distal 62                   triphasic          +-----------+--------+-----+--------+----------+--------+ PERO Distal34                   monophasic         +-----------+--------+-----+--------+----------+--------+  +-----------+--------+-----+--------+---------+--------+  LEFT       PSV cm/sRatioStenosisWaveform Comments +-----------+--------+-----+--------+---------+--------+ DFA        49                   triphasic         +-----------+--------+-----+--------+---------+--------+ SFA Prox   68                   triphasic         +-----------+--------+-----+--------+---------+--------+ SFA Distal 67                   triphasic         +-----------+--------+-----+--------+---------+--------+ POP Distal 70                   triphasic         +-----------+--------+-----+--------+---------+--------+ TP Trunk   55                   triphasic         +-----------+--------+-----+--------+---------+--------+ ATA Distal 19                   thumped           +-----------+--------+-----+--------+---------+--------+ PTA Distal 66                   triphasic         +-----------+--------+-----+--------+---------+--------+ PERO Distal50                   biphasic          +-----------+--------+-----+--------+---------+--------+  Summary: Right: No obvious evidence of hemodynamically significant stenosis. Left: No obvious evidence of hemodynamically significant stenosis. Distal ATA exhibits a thumped waveform, suggestive of distal occlusion.  See table(s) above for measurements and observations. Electronically signed by Waverly Ferrari MD on 12/04/2019 at 7:41:21  PM.    Final    Derrel Nip, MD 12/05/2019, 5:49 AM PGY-1, [facility] Family Medicine FPTS Intern pager: [phone], text pages welcome

## 2019-12-05 NOTE — Progress Notes (Signed)
Brief cardiology note:  We are following peripherally, awaiting discussion re: goals of care planned for tomorrow morning. Will leave final recommendations at that time.  Jodelle Red, MD, PhD [facility]  [address] [place], [place] [address] [phone]
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
## 2019-12-06 DIAGNOSIS — R4182 Altered mental status, unspecified: Secondary | ICD-10-CM

## 2019-12-06 DIAGNOSIS — Z515 Encounter for palliative care: Secondary | ICD-10-CM

## 2019-12-06 LAB — PHOSPHORUS: Phosphorus: 4.1 mg/dL (ref 2.5–4.6)

## 2019-12-06 LAB — COMPREHENSIVE METABOLIC PANEL
ALT: 415 U/L — ABNORMAL HIGH (ref 0–44)
AST: 200 U/L — ABNORMAL HIGH (ref 15–41)
Albumin: 2.7 g/dL — ABNORMAL LOW (ref 3.5–5.0)
Alkaline Phosphatase: 121 U/L (ref 38–126)
Anion gap: 13 (ref 5–15)
BUN: 97 mg/dL — ABNORMAL HIGH (ref 8–23)
CO2: 19 mmol/L — ABNORMAL LOW (ref 22–32)
Calcium: 9.7 mg/dL (ref 8.9–10.3)
Chloride: 106 mmol/L (ref 98–111)
Creatinine, Ser: 2.45 mg/dL — ABNORMAL HIGH (ref 0.61–1.24)
GFR calc Af Amer: 30 mL/min — ABNORMAL LOW (ref >60)
GFR calc non Af Amer: 26 mL/min — ABNORMAL LOW (ref >60)
Glucose, Bld: 110 mg/dL — ABNORMAL HIGH (ref 70–99)
Potassium: 4.9 mmol/L (ref 3.5–5.1)
Sodium: 138 mmol/L (ref 135–145)
Total Bilirubin: 3.4 mg/dL — ABNORMAL HIGH (ref 0.3–1.2)
Total Protein: 5.1 g/dL — ABNORMAL LOW (ref 6.5–8.1)

## 2019-12-06 LAB — CBC
HCT: 41.6 % (ref 39.0–52.0)
Hemoglobin: 13.4 g/dL (ref 13.0–17.0)
MCH: 29.1 pg (ref 26.0–34.0)
MCHC: 32.2 g/dL (ref 30.0–36.0)
MCV: 90.4 fL (ref 80.0–100.0)
Platelets: 112 K/uL — ABNORMAL LOW (ref 150–400)
RBC: 4.6 MIL/uL (ref 4.22–5.81)
RDW: 21.2 % — ABNORMAL HIGH (ref 11.5–15.5)
WBC: 11 K/uL — ABNORMAL HIGH (ref 4.0–10.5)
nRBC: 0.5 % — ABNORMAL HIGH (ref 0.0–0.2)

## 2019-12-06 LAB — MAGNESIUM: Magnesium: 2.4 mg/dL (ref 1.7–2.4)

## 2019-12-06 LAB — PROTIME-INR
INR: 2.1 — ABNORMAL HIGH (ref 0.8–1.2)
Prothrombin Time: 22.6 seconds — ABNORMAL HIGH (ref 11.4–15.2)

## 2019-12-06 MED ORDER — PREGABALIN 50 MG PO CAPS: 50.0000 mg | ORAL_CAPSULE | Freq: Every day | ORAL | 0 refills | Status: AC

## 2019-12-06 MED ORDER — HYDROMORPHONE HCL 1 MG/ML IJ SOLN
1.0000 mg | INTRAMUSCULAR | Status: AC
  Administered 2019-12-06: 1 mg via INTRAVENOUS
  Filled 2019-12-06: qty 1

## 2019-12-06 MED ORDER — POLYVINYL ALCOHOL 1.4 % OP SOLN
1.0000 [drp] | Freq: Four times a day (QID) | OPHTHALMIC | Status: DC | PRN
  Filled 2019-12-06: qty 15

## 2019-12-06 MED ORDER — HALOPERIDOL LACTATE 2 MG/ML PO CONC
0.5000 mg | ORAL | Status: DC | PRN
  Filled 2019-12-06: qty 0.3

## 2019-12-06 MED ORDER — HALOPERIDOL 0.5 MG PO TABS: 0.5000 mg | ORAL_TABLET | ORAL | Status: DC | PRN

## 2019-12-06 MED ORDER — ACETAMINOPHEN 650 MG RE SUPP: 650.0000 mg | Freq: Four times a day (QID) | RECTAL | Status: DC | PRN

## 2019-12-06 MED ORDER — LORAZEPAM 2 MG/ML IJ SOLN: 1.0000 mg | INTRAMUSCULAR | Status: DC | PRN

## 2019-12-06 MED ORDER — DIPHENHYDRAMINE HCL 50 MG/ML IJ SOLN: 12.5000 mg | INTRAMUSCULAR | Status: DC | PRN

## 2019-12-06 MED ORDER — BIOTENE DRY MOUTH MT LIQD: 15.0000 mL | OROMUCOSAL | Status: DC | PRN

## 2019-12-06 MED ORDER — GLYCOPYRROLATE 0.2 MG/ML IJ SOLN: 0.2000 mg | INTRAMUSCULAR | Status: DC | PRN

## 2019-12-06 MED ORDER — ONDANSETRON 4 MG PO TBDP: 4.0000 mg | ORAL_TABLET | Freq: Four times a day (QID) | ORAL | Status: DC | PRN

## 2019-12-06 MED ORDER — GLYCOPYRROLATE 1 MG PO TABS: 1.0000 mg | ORAL_TABLET | ORAL | Status: DC | PRN

## 2019-12-06 MED ORDER — ACETAMINOPHEN 325 MG PO TABS: 650.0000 mg | ORAL_TABLET | Freq: Four times a day (QID) | ORAL | Status: DC | PRN

## 2019-12-06 MED ORDER — HYDROMORPHONE HCL 1 MG/ML IJ SOLN: 0.5000 mg | INTRAMUSCULAR | Status: DC | PRN

## 2019-12-06 MED ORDER — ONDANSETRON HCL 4 MG/2ML IJ SOLN: 4.0000 mg | Freq: Four times a day (QID) | INTRAMUSCULAR | Status: DC | PRN

## 2019-12-06 MED ORDER — HYDROMORPHONE HCL 1 MG/ML IJ SOLN
1.0000 mg | INTRAMUSCULAR | Status: DC
  Administered 2019-12-06: 1 mg via INTRAVENOUS

## 2019-12-06 MED ORDER — HALOPERIDOL LACTATE 5 MG/ML IJ SOLN: 0.5000 mg | INTRAMUSCULAR | Status: DC | PRN

## 2019-12-06 MED ORDER — LORAZEPAM 1 MG PO TABS: 1.0000 mg | ORAL_TABLET | ORAL | Status: DC | PRN

## 2019-12-06 MED ORDER — LORAZEPAM 2 MG/ML PO CONC: 1.0000 mg | ORAL | Status: DC | PRN

## 2019-12-06 NOTE — Progress Notes (Signed)
CSW met with pt to discuss recommendation for SNF. Pt agreeable to SNF workup. Wants CSW to contact his roommate Tamra.

## 2019-12-06 NOTE — TOC Transition Note (Signed)
Transition of Care [facility]) - CM/SW Discharge Note   Patient Details  Name: JASN XIA MRN: [id number] Date of Birth: Jul 09, 1950  [facility]) CM/SW Contact:  Doy Hutching, LCSW Phone Number: 12/06/2019, 1:51 PM   Clinical Narrative:    CSW acknowledging consult for [facility]. Per documentation [facility] does have a bed, await confirmation of signed consents. Dr. Melba Coon contacted for d/c summary and Lora Havens, NP with Palliative has been contacted for signed DNR.   Will arrange PTAR when all plans confirmed.   Final next level of care: Hospice Medical Facility Barriers to Discharge: Barriers Resolved   Patient Goals and CMS Choice Patient states their goals for this hospitalization and ongoing recovery are:: comfort care CMS Medicare.gov Compare Post Acute Care list provided to:: Patient Represenative (must comment) Choice offered to / list presented to :  (Roommate)  Discharge Placement       Patient chooses bed at: Other - please specify in the comment section below: [facility]) Patient to be transferred to facility by: PTAR Name of family member notified: pt friend Tamra Patient and family notified of of transfer: 12/06/19  Readmission Risk Interventions No flowsheet data found.

## 2019-12-06 NOTE — Plan of Care (Signed)
  Problem: Nutrition: Goal: Adequate nutrition will be maintained Outcome: Not Progressing Note: Encourage patient to eat. Offer enliven to supplement oral intake   Problem: Activity: Goal: Risk for activity intolerance will decrease Outcome: Progressing   Problem: Elimination: Goal: Will not experience complications related to bowel motility Outcome: Progressing Goal: Will not experience complications related to urinary retention Outcome: Progressing   Problem: Pain Managment: Goal: General experience of comfort will improve Outcome: Progressing

## 2019-12-06 NOTE — Progress Notes (Signed)
MD on call paged patient is moaning with pain after repositioning no PRN pain medication.

## 2019-12-06 NOTE — Social Work (Addendum)
Clinical Social Worker facilitated patient discharge including contacting patient family and facility to confirm patient discharge plans.  Clinical information faxed to facility and family agreeable with plan.  CSW arranged ambulance transport via PTAR to [facility].  RN to call [phone] with report prior to discharge.  Clinical Social Worker will sign off for now as social work intervention is no longer needed. Please consult us again if new need arises.  Mary Hockey, MSW, LCSW Clinical Social Worker   

## 2019-12-06 NOTE — Progress Notes (Signed)
Nutrition Brief Note  Chart reviewed. Pt now transitioning to comfort care.  No further nutrition interventions warranted at this time.  Please re-consult as needed.   Maize Brittingham, MS, RD, LDN RD pager number and weekend/on-call pager number located in Amion.    

## 2019-12-06 NOTE — Progress Notes (Addendum)
*  12:10pm addendum:  [facility] has a bed available at this time.  Voicemail left for [employer].  TOC made aware.  This writer will make TOC aware when consents have been completed; transportation can be called at that time.  Report can be called to [phone] onc consents have been received.  Civil engineer, contracting [facility]) Hospital Liaison note.   Received request from [facility] manager for  interest in [facility]. Spoke with roommate and friend Barrie Dunker to confirm interest and explain services.  Tamra confirmed desire for [facility] for end of life care. Unfortunately, [facility] is unable to offer a room today. TOC made aware.  Hospital Liaison will follow up tomorrow or sooner if a room becomes available.  Please do not hesitate to call with questions.    Thank you for the opportunity to participate in this patient's care.  Chrislyn Brooke Dare, BSN, RN [facility] Liaison (listed on AMION under Hospice/[facility])    [phone]

## 2019-12-06 NOTE — Progress Notes (Signed)
Family Medicine Teaching Service Daily Progress Note Intern Pager: [phone]  Patient name: Matthew [name] Medical record number: [id number] Date of birth: [birth year] Age: 69 y.o. Gender: male  Primary Care Provider: Zenia Resides, [name] Consultants: Palliative, Cardiology Code Status: FULL  Pt Overview and Major Events to Date:  Admitted 6/27  Palliative Discussion 7/1; switched to comfort care at this time  Assessment and Plan:  Aspiration pneumonia Chest CT which showed findings acute for multifocal pneumonia.  Speech evaluated the patient with IM modified barium swallow and identified microaspirations which they feel is most likely a chronic issue.  WBCs are down-tending from 12.3 yesterday to 11 today. -Continue ceftriaxone 1 g every 24 hours with a total duration of 5 days.  Last day is today 7/1. -Patient has been switched to comfort care after palliative discussion with his roommate Matthew [name].   Acute on chronic liver failure On admission AST/ALT were elevated at 105/164 with a T bili of 4.8.  Patient reports remote history of IV drug and alcohol use.  CT abdomen showed decompressed gallbladder with gallstones but no evidence of cholecystitis.   AST and ALT have decreased since yesterday from 381/479 to 200/415.  Bili is essentially stable from 3.6 yesterday to 3.4 today.  Hepatitis panel nonreactive. PT elevated at 22.6 and INR 2.1. -Avoid hepatotoxic medications    New HFrEF Echocardiogram 6/28 showed LVEF 20 to 25% with severely decreased left ventricular function.  Global hypokinesis.  LV severely dilated with intermediate diastolic filling.  RV systolic function severely reduced.  Moderate to severe mitral valve regurgitation.  Moderately elevated pulmonary artery systolic pressure.  IVC is dilated with less than 50% respiratory variability.  Left bundle branch block on EKG.  Very difficult to determine volume status at this time. Cardiology has consulted and will await  palliative team plan.  -Given poor systolic function was be gentle with hydration -Discontinue IV fluids at this time  Failure to thrive with recurrent falls Patient extremely cachectic on exam.  Per chart review the patient has lost approximately 50 pounds over the last year and a half.  It appears that his appetite has decreased since he has been injured but the weight loss has been going on for duration significantly longer than that, according to his roommate Matthew [name].  Speech evaluated patient who completed modified barium swallow.  Noted generalized weakness of all pharyngeal structures with reduced epiglottic inversion resulting in trace aspiration of thin liquids prior to swallow. Palliative care team met with Matthew [name] and transitioned him to comfort care. -Dysphagia 1 diet with thin liquids, staff to assist with self-feeding, sit upright at 90 degrees  -Up with assistance -Fall precautions -Since patient is now comfort care, will no longer be monitoring labs   AKI-stable, unresolved Baseline creatinine is around 1.5.  Creatinine on admission was elevated at 1.8.  This morning's creatinine was up 2.45 from 2.04 yesterday. -Avoid nephrotoxic agents -Holding fluids at this time  Peripheral vascular disease Patient has longstanding history of peripheral arterial disease.  Vascular was consulted and they are following. -Vascular consulted, appreciate recommendations -Matthew [name] is being made aware of the patient's admission -Vascular is not planning intervention during this hospitalization, patient now on comfort care  Anxiety Home medications include Valium  -Continue home medication however to avoid withdrawal -Discontinued Mirtazapine   Chronic pain Patient with history of chronic cervical pain which she reports having procedure done. Home medications include Lyrica 100 mg at bedtime. -Lyrica 50 mg at bedtime ordered  Hx of CVA Sometime in the 2000s. No currently  medications  Hx of knee surgery Patient unsure of when this occurred but reports it was "years ago".  Hx of smoking Quit 17 years ago. 1 ppd   Hx of recreational drug use Not for the past 10 years. Pervious use of cocaine, heroin, MJ. -Patient comfort care at this time   FEN/GI: Full liquid diet PPx: Lovenox   Disposition: Transfer to [place]:  Today Matthew [name] is accompanied by his roommate Matthew [name] who states she is scheduled to meet with the palliative team this morning to discuss goals of care. She tells me that she does not want any unnecessary tests done to him and that she just wishes to make him comfortable. She notes that he has severely decompensated and states she was unsuccessful at trying to get him to come to the hospital prior to his fall. [name] is somnolent, falling asleep during our conversation. According to Matthew [name], he has not been eating much and only had a few sips of his drink last night.   Objective: Temp:  [97.7 F (36.5 C)-97.8 F (36.6 C)] 97.8 F (36.6 C) (06/30 2100) Pulse Rate:  [75-90] 75 (06/30 2100) Resp:  [17-20] 17 (06/30 2100) BP: (100-109)/(67-71) 104/70 (06/30 2100) SpO2:  [98 %-100 %] 98 % (06/30 2100) Physical Exam: General: cachectic, somnolent Cardiovascular: difficult to auscultate Respiratory: clear to auscultation anteriorly, difficulty repositioning patient to assess posterior fields Abdomen: has brace on for rib fractures. No RUQ tenderness appreciated today. Extremities: 1+ pitting edema in RLE to ankle. His left ankle was wrapped today, did not assess.   Laboratory: Recent Labs  Lab 12/04/19 0632 12/05/19 0351 12/06/19 0249  WBC 12.0* 12.3* 11.0*  HGB 13.2 12.9* 13.4  HCT 41.2 40.6 41.6  PLT 121* 121* 112*   Recent Labs  Lab 12/04/19 0632 12/05/19 0351 12/06/19 0249  NA 140 140 138  K 4.0 5.2* 4.9  CL 109 108 106  CO2 16* 15* 19*  BUN 79* 89* 97*  CREATININE 2.00* 2.04* 2.45*  CALCIUM 9.8  9.9 9.7  PROT 5.2* 5.1* 5.1*  BILITOT 3.3* 3.6* 3.4*  ALKPHOS 117 120 121  ALT 431* 479* 415*  AST 363* 381* 200*  GLUCOSE 116* 103* 110*     Imaging/Diagnostic Tests: CT Head WO Contrast IMPRESSION: 1. No acute intracranial abnormalities. 2. Age-appropriate volume loss and mild chronic microvascular ischemic change.  CT Abdomen Pelvis WO Contrast IMPRESSION: 1. There are ground-glass and patchy confluent opacities in the lower lobes, right greater than left, consistent with multifocal pneumonia versus aspiration pneumonitis. Small bilateral pleural effusions 2. Nondisplaced fractures, which appear recent, of the left lateral eighth and ninth ribs. No other fractures. 3. No other acute abnormality within the abdomen or pelvis. 4. Previously treated, 4.2 cm, abdominal aortic aneurysm. No evidence of rupture.  Chest Portable 1 View IMPRESSION: Patchy atelectasis/consolidation at the lung bases.  US Abdomen Limited RUQ  IMPRESSION: 1. Cholelithiasis. 2. Ascites. 3. Distended hepatic veins consistent with elevated right heart pressure. 4. Right pleural effusion.  Swallowing Func-Speech Pathology Mr. Ecklund demonstrates a moderate oropharyngeal dysphagia 2/2 generalized weakness, failure to thrive status, and known h/o ACDF surgery with significant hardware in place. Oral phase is c/b: - reduced labial seal, resulting in anterior spillage of thin liquids via cup  -edentulous status with pt deferring solids -reduced lingual strength/control, resulting in piecemeal deglutition and mild oral residue after the swallow  Pharyngeal phase is c/b: -generalized  weakness of all pharyngeal structures -reduced BOT retraction, resulting in BOT residue across consistencies -reduced epiglottic inversion/laryngeal vestibule closure, resulting in trace aspiration of thin liquids prior to the swallow -reduced pharyngeal constriction, resulting in moderate residue throughout pharynx,  eventually resulting in trace aspiration across all consistencies -reduced sensation, with no effort made to clear aspiration -pt unable to complete volitional cough/throat clear d/t difficulty following commands and pain with cough from broken ribs  Esophageal phase is c/b: -timely clearance; no retrograde movement observed  Overall, pt is at high risk for aspiration and presently has aspiration pneumonia. Oral care is poor and pt is edentulous. Suspect based on no acute event causing dysphagia that aspiration is likely chronic in nature, just exacerbated by inability to cough to clear aspiration from rib fx and general debility. Per discussion with [name], recommending pt continue thin liquids and pureed solids with diligent oral care QID to mitigate further PNA. Findings discussed with pt, MD, RN.  Sharion Settler, [name] 12/06/2019, 6:20 AM PGY-1, [place] Intern pager: [phone], text pages welcome

## 2019-12-06 NOTE — Progress Notes (Signed)
Brief cardiology update:  Alerted by palliative care team that patient will be transitioned to comfort care. Given this, cardiology will sign off at this time, but if we can be of further assistance please do not hesitate to contact us.  Jodelle Red, MD, PhD [facility]  [address], [address] [place], [place] [address] [phone]
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
## 2019-12-07 LAB — CULTURE, BLOOD (ROUTINE X 2)
Culture: NO GROWTH
Culture: NO GROWTH
Special Requests: ADEQUATE

## 2020-01-11 ENCOUNTER — Ambulatory Visit: Payer: Medicare HMO

## 2020-05-15 ENCOUNTER — Encounter: Payer: Self-pay | Admitting: Adult Health

## 2020-05-15 ENCOUNTER — Ambulatory Visit: Payer: Medicare HMO | Admitting: Adult Health

## 2020-05-28 ENCOUNTER — Telehealth: Payer: Self-pay | Admitting: Adult Health

## 2020-05-28 NOTE — Telephone Encounter (Signed)
Patient No Show letter returned from 05/15/2020 the address is unknown.

## 2021-11-07 IMAGING — CT CT HEAD W/O CM
3 series · 16 of 47 positions shown, 19 images · non-contrast
Comparison: Brain MRI, 02/10/2012.

CLINICAL DATA: Altered mental status.  Failure to thrive.

EXAM:
CT HEAD WITHOUT CONTRAST
TECHNIQUE: Contiguous axial images were obtained from the base of the skull
through the vertex without intravenous contrast.

[Series 1: head 5.0 h30s · axial · 0.45mm/px · z∈[-140,+0]mm · 10 of 34 slices shown, 13 images]
[im 3/34  brain]
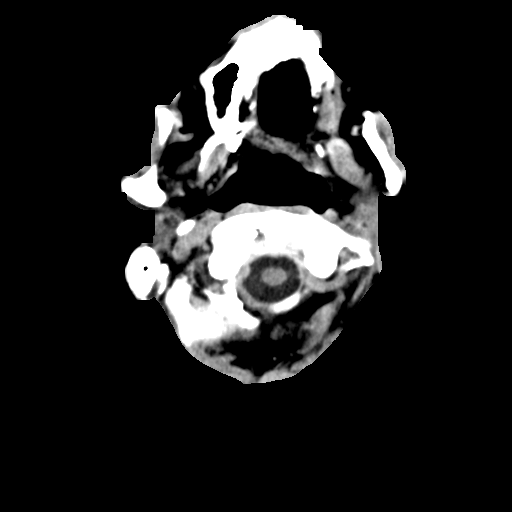
[im 3/34  bone]
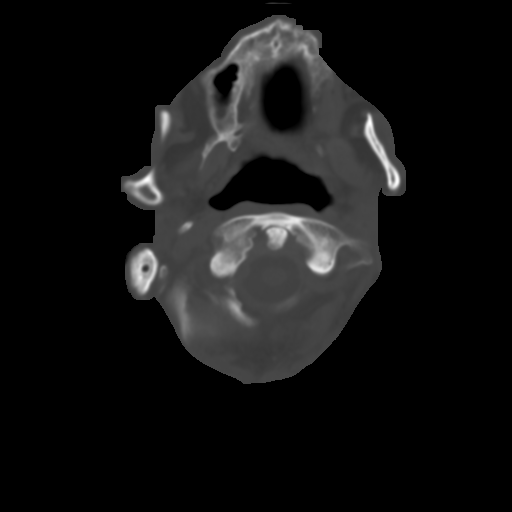
[im 6/34  brain]
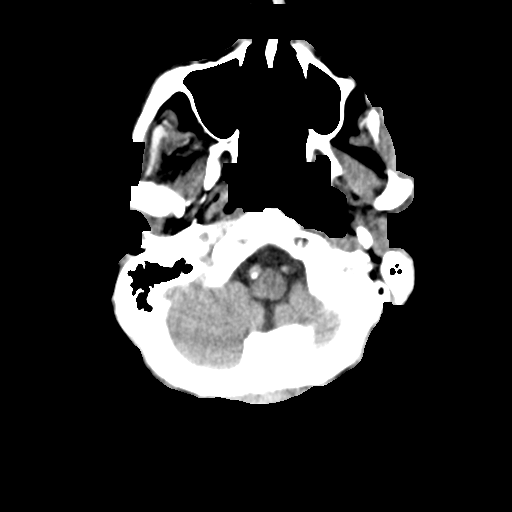
[im 10/34  brain]
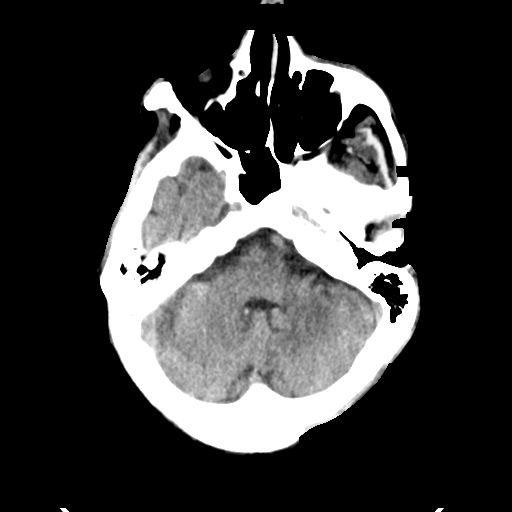
[im 12/34  brain]
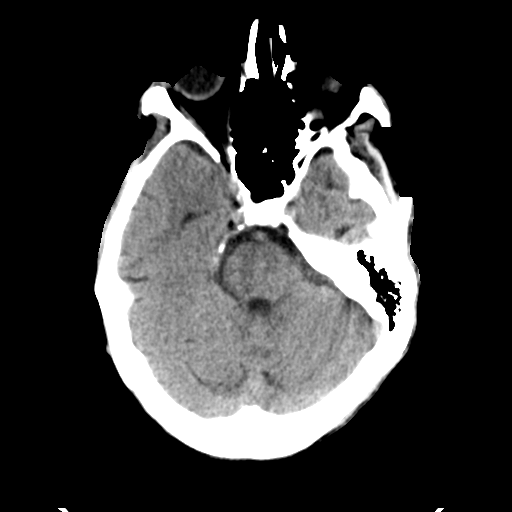
[im 15/34  brain]
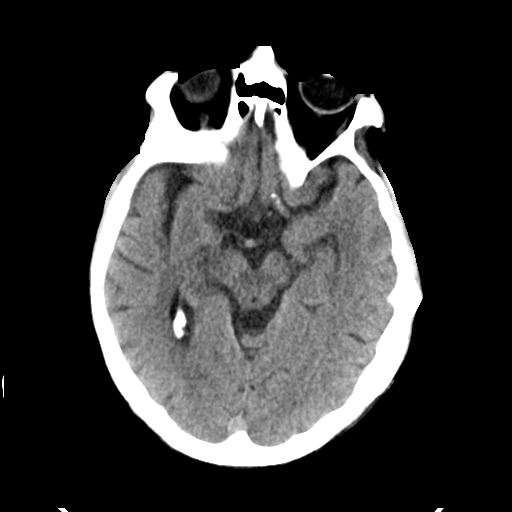
[im 15/34  bone]
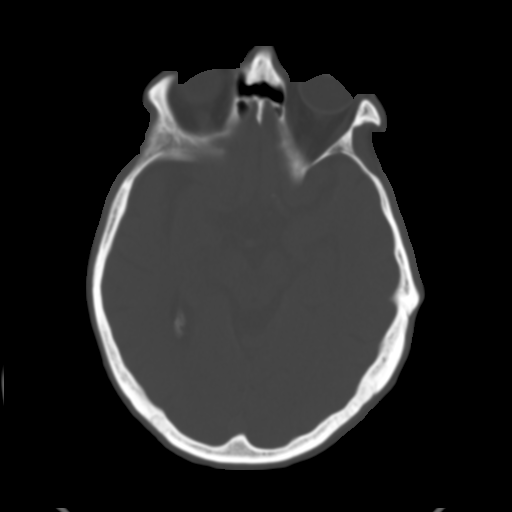
[im 19/34  brain]
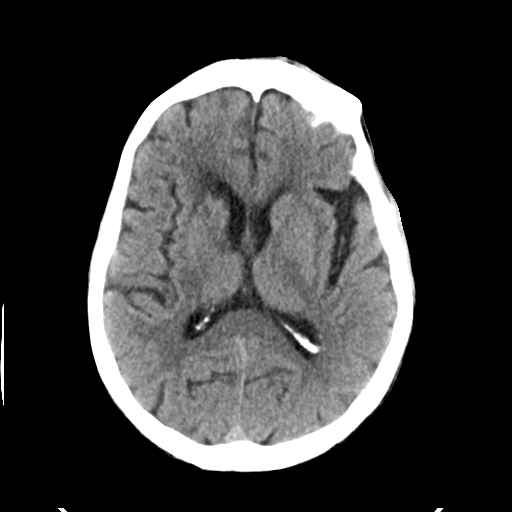
[im 22/34  brain]
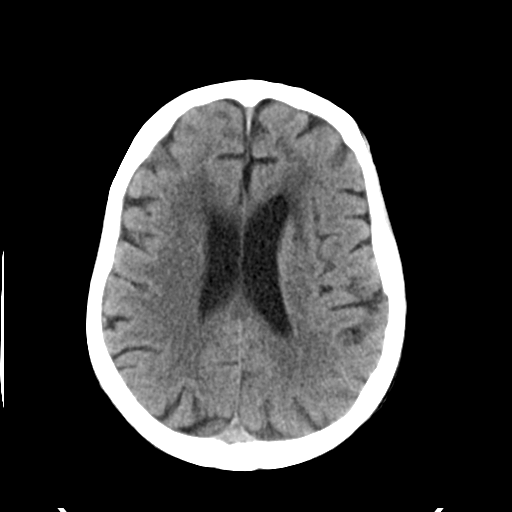
[im 26/34  brain]
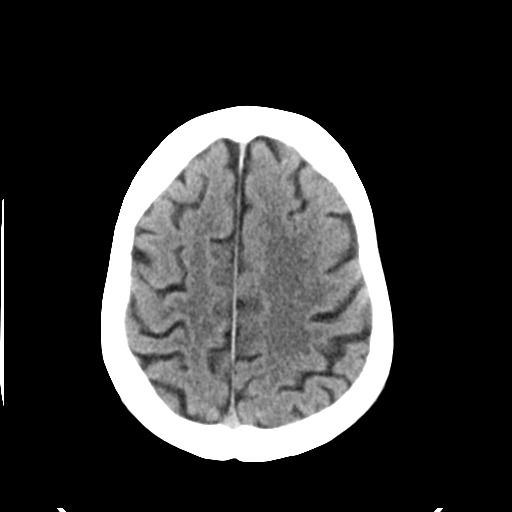
[im 28/34  brain]
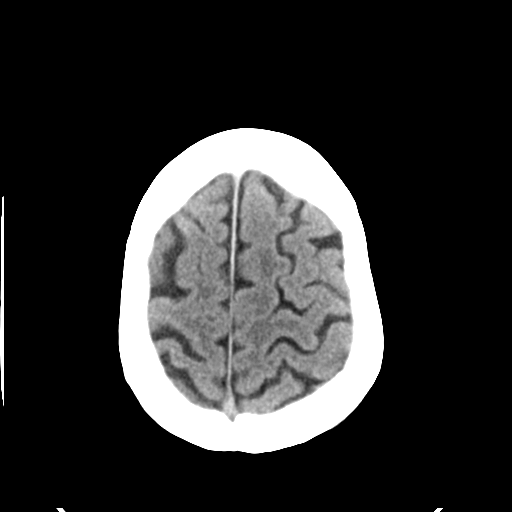
[im 28/34  bone]
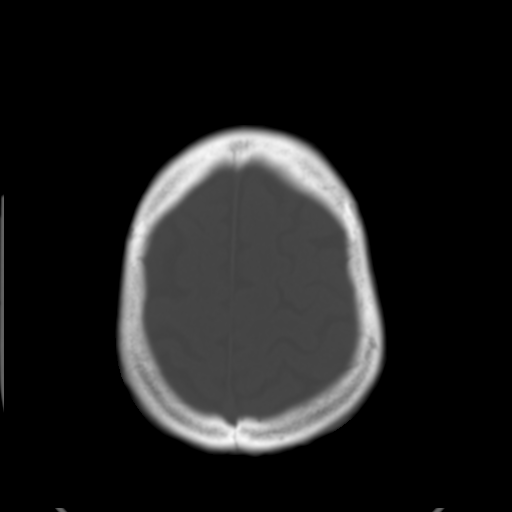
[im 31/34  brain]
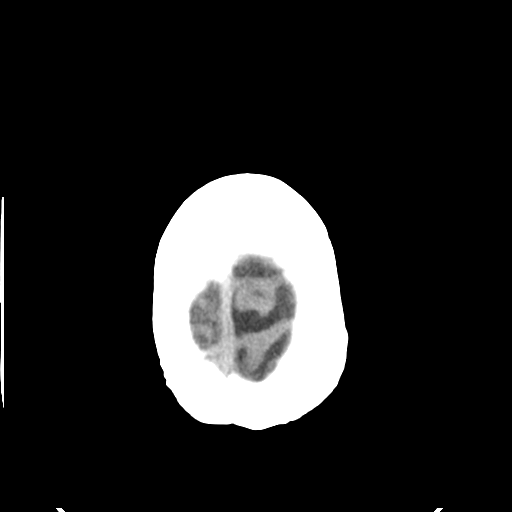

[Series 7: head 3.0 mpr sag · sagittal · 0.34mm/px · 3 of 59 slices shown]
[im 20/59  brain]
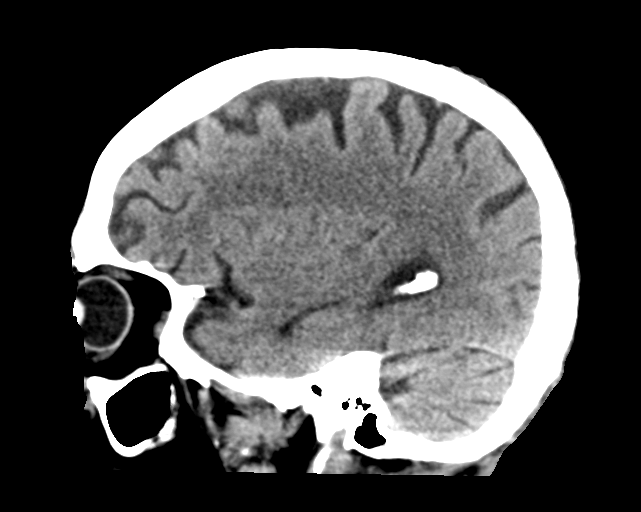
[im 30/59  brain]
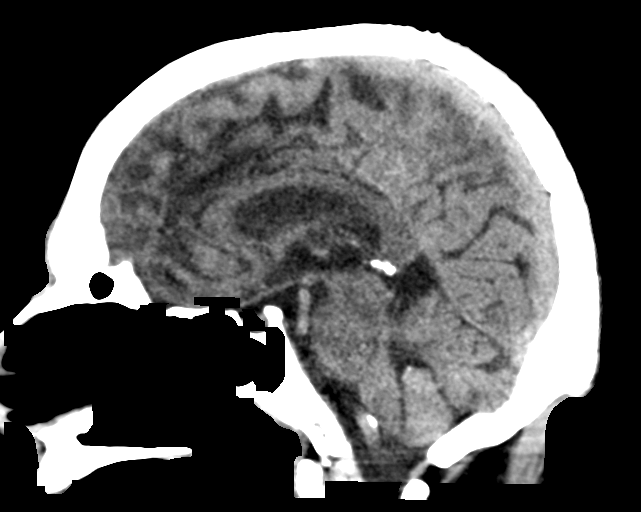
[im 39/59  brain]
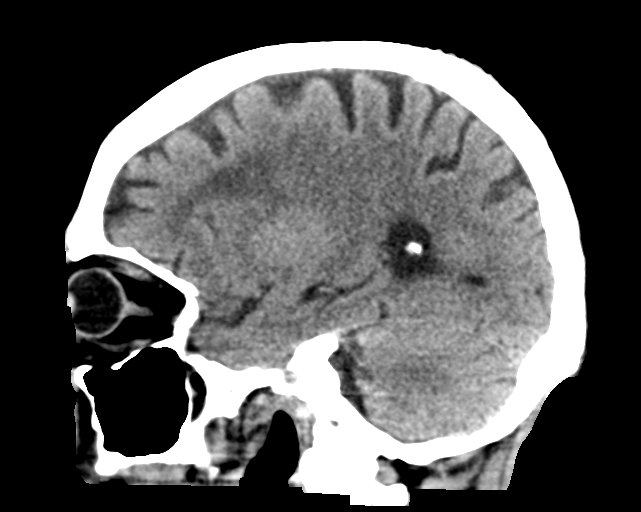

[Series 8: head 3.0 mpr cor · coronal · 0.34mm/px · 3 of 76 slices shown]
[im 26/76  brain]
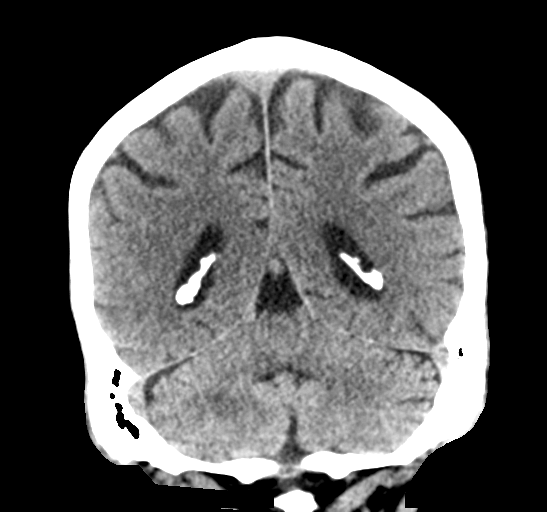
[im 34/76  brain]
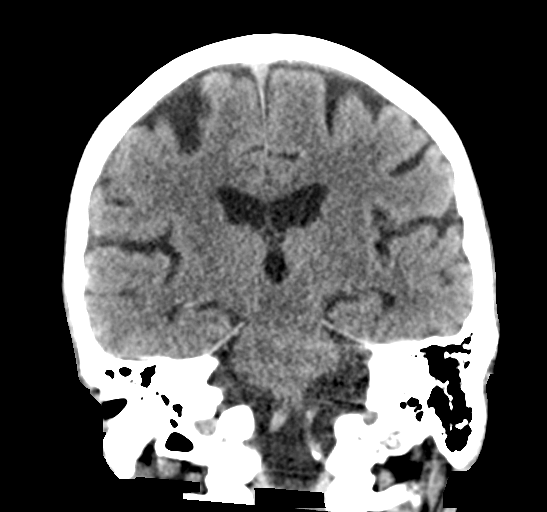
[im 42/76  brain]
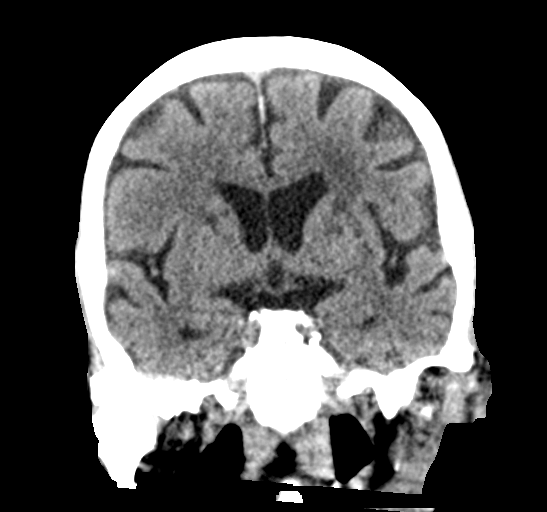

[16 of 47 positions shown; findings below may reference images not displayed]

FINDINGS: Brain: No evidence of acute infarction, hemorrhage, hydrocephalus,
extra-axial collection or mass lesion/mass effect.

There is mild ventricular and sulcal prominence consistent with
age-appropriate volume loss. Patchy areas of white matter
hypoattenuation are also noted consistent with mild chronic
microvascular ischemic change.

Vascular: No hyperdense vessel or unexpected calcification.

Skull: Normal. Negative for fracture or focal lesion.

Sinuses/Orbits: Globes and orbits are unremarkable. Sinuses are
clear.

Other: None.
IMPRESSION: 1. No acute intracranial abnormalities.
2. Age-appropriate volume loss and mild chronic microvascular
ischemic change.
